# Patient Record
Sex: Female | Born: 1955 | Race: White | Hispanic: No | Marital: Married | Smoking: Never smoker
Health system: Southern US, Community
[De-identification: ages and names within clinical notes are randomized; demographics above are authoritative.]

## PROBLEM LIST (undated history)

## (undated) DIAGNOSIS — I7300 Raynaud's syndrome without gangrene: Secondary | ICD-10-CM

## (undated) DIAGNOSIS — M0230 Reiter's disease, unspecified site: Secondary | ICD-10-CM

## (undated) DIAGNOSIS — M069 Rheumatoid arthritis, unspecified: Secondary | ICD-10-CM

## (undated) DIAGNOSIS — G5139 Clonic hemifacial spasm, unspecified: Secondary | ICD-10-CM

## (undated) DIAGNOSIS — I10 Essential (primary) hypertension: Secondary | ICD-10-CM

## (undated) DIAGNOSIS — G479 Sleep disorder, unspecified: Secondary | ICD-10-CM

## (undated) DIAGNOSIS — M1990 Unspecified osteoarthritis, unspecified site: Secondary | ICD-10-CM

## (undated) DIAGNOSIS — M797 Fibromyalgia: Secondary | ICD-10-CM

## (undated) DIAGNOSIS — M8580 Other specified disorders of bone density and structure, unspecified site: Secondary | ICD-10-CM

## (undated) DIAGNOSIS — N898 Other specified noninflammatory disorders of vagina: Secondary | ICD-10-CM

## (undated) DIAGNOSIS — H3530 Unspecified macular degeneration: Secondary | ICD-10-CM

## (undated) HISTORY — PX: OTHER SURGICAL HISTORY: SHX169

## (undated) HISTORY — DX: Rheumatoid arthritis, unspecified: M06.9

## (undated) HISTORY — DX: Other specified noninflammatory disorders of vagina: N89.8

## (undated) HISTORY — DX: Clonic hemifacial spasm, unspecified: G51.39

## (undated) HISTORY — DX: Essential (primary) hypertension: I10

## (undated) HISTORY — DX: Sleep disorder, unspecified: G47.9

## (undated) HISTORY — DX: Raynaud's syndrome without gangrene: I73.00

## (undated) HISTORY — DX: Unspecified macular degeneration: H35.30

## (undated) HISTORY — DX: Fibromyalgia: M79.7

## (undated) HISTORY — DX: Reiter's disease, unspecified site: M02.30

## (undated) HISTORY — DX: Unspecified osteoarthritis, unspecified site: M19.90

## (undated) HISTORY — PX: BREAST SURGERY: SHX581

## (undated) HISTORY — DX: Other specified disorders of bone density and structure, unspecified site: M85.80

---

## 2003-06-18 ENCOUNTER — Encounter: Admission: RE | Admit: 2003-06-18 | Discharge: 2003-06-18 | Payer: Self-pay | Admitting: Rheumatology

## 2003-06-18 ENCOUNTER — Encounter: Payer: Self-pay | Admitting: Rheumatology

## 2003-10-09 ENCOUNTER — Ambulatory Visit (HOSPITAL_COMMUNITY): Admission: RE | Admit: 2003-10-09 | Discharge: 2003-10-09 | Payer: Self-pay | Admitting: Family Medicine

## 2004-08-14 ENCOUNTER — Other Ambulatory Visit: Admission: RE | Admit: 2004-08-14 | Discharge: 2004-08-14 | Payer: Self-pay | Admitting: Obstetrics and Gynecology

## 2004-09-30 ENCOUNTER — Encounter: Admission: RE | Admit: 2004-09-30 | Discharge: 2004-09-30 | Payer: Self-pay | Admitting: Gastroenterology

## 2004-10-06 ENCOUNTER — Encounter (INDEPENDENT_AMBULATORY_CARE_PROVIDER_SITE_OTHER): Payer: Self-pay | Admitting: *Deleted

## 2004-10-06 ENCOUNTER — Ambulatory Visit (HOSPITAL_COMMUNITY): Admission: RE | Admit: 2004-10-06 | Discharge: 2004-10-06 | Payer: Self-pay | Admitting: Gastroenterology

## 2004-11-02 ENCOUNTER — Ambulatory Visit (HOSPITAL_COMMUNITY): Admission: RE | Admit: 2004-11-02 | Discharge: 2004-11-02 | Payer: Self-pay | Admitting: Family Medicine

## 2005-08-20 ENCOUNTER — Other Ambulatory Visit: Admission: RE | Admit: 2005-08-20 | Discharge: 2005-08-20 | Payer: Self-pay | Admitting: Obstetrics and Gynecology

## 2005-11-12 ENCOUNTER — Ambulatory Visit (HOSPITAL_COMMUNITY): Admission: RE | Admit: 2005-11-12 | Discharge: 2005-11-12 | Payer: Self-pay | Admitting: Obstetrics and Gynecology

## 2006-11-14 ENCOUNTER — Ambulatory Visit (HOSPITAL_COMMUNITY): Admission: RE | Admit: 2006-11-14 | Discharge: 2006-11-14 | Payer: Self-pay | Admitting: Obstetrics and Gynecology

## 2006-11-22 ENCOUNTER — Encounter: Admission: RE | Admit: 2006-11-22 | Discharge: 2006-11-22 | Payer: Self-pay | Admitting: Obstetrics and Gynecology

## 2006-11-22 ENCOUNTER — Other Ambulatory Visit: Admission: RE | Admit: 2006-11-22 | Discharge: 2006-11-22 | Payer: Self-pay | Admitting: Obstetrics and Gynecology

## 2007-11-04 IMAGING — MG MM DIAGNOSTIC UNILATERAL L
3 series · 3 of 3 positions shown · non-contrast
Comparison: none

DG DIAGNOSTIC UNILATERAL L
CC and MLO view(s) were taken of the left breast.

LEFT BREAST ULTRASOUND
DIGITAL UNILATERAL LEFT DIAGNOSTIC MAMMOGRAM AND LEFT BREAST ULTRASOUND:
CLINICAL DATA: The patient returns for evaluation of a possible mass in the left breast noted on 
recent screening study from [HOSPITAL] dated 11-14-06.

[L CC]
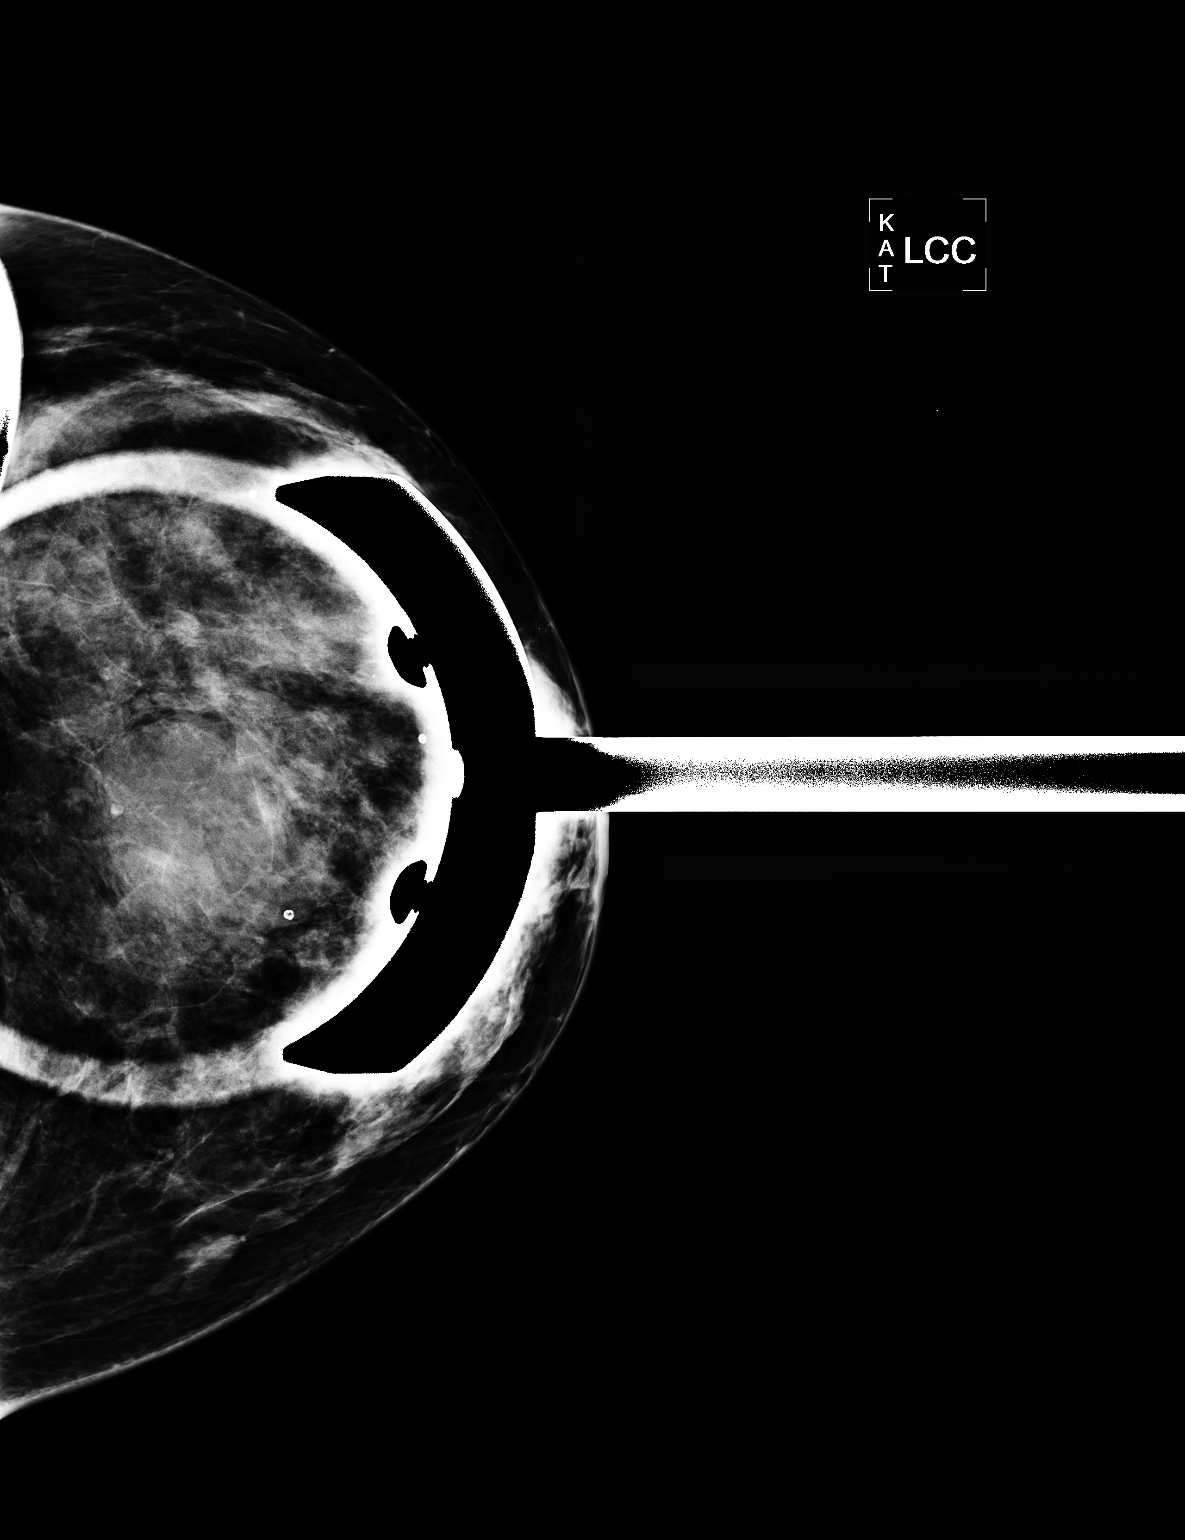

[L MLO (1 of 2)]
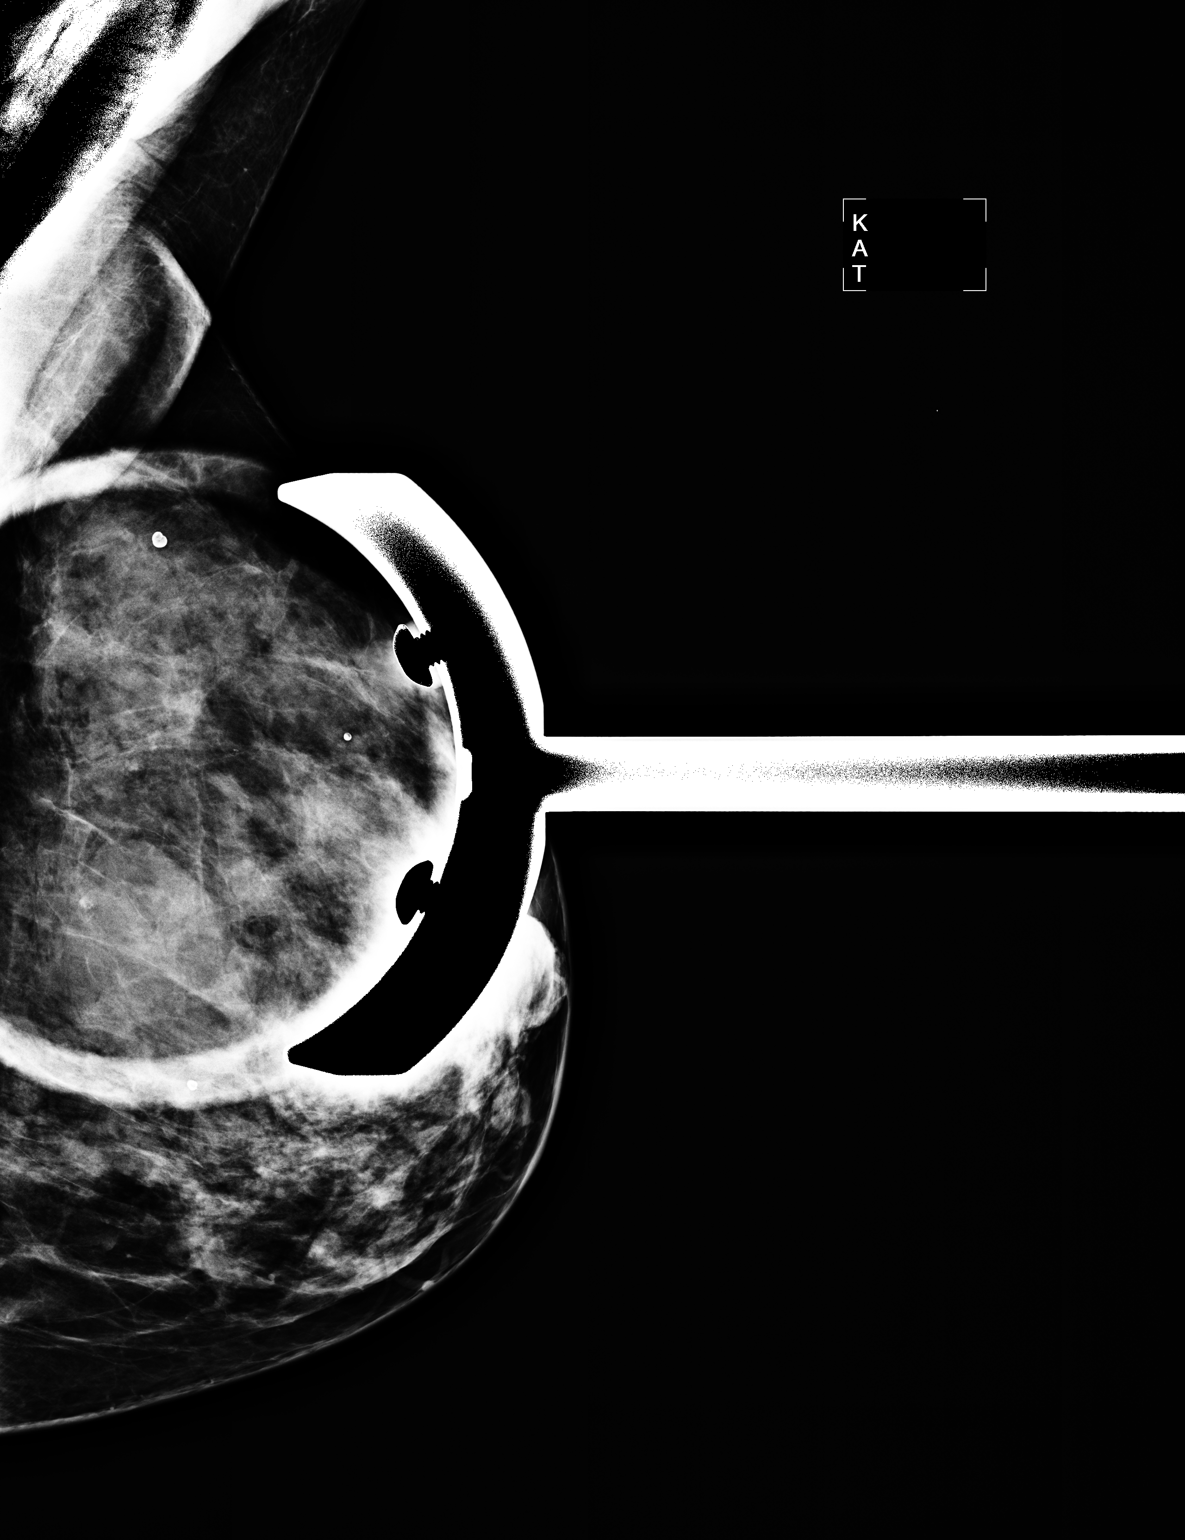

[L MLO (2 of 2)]
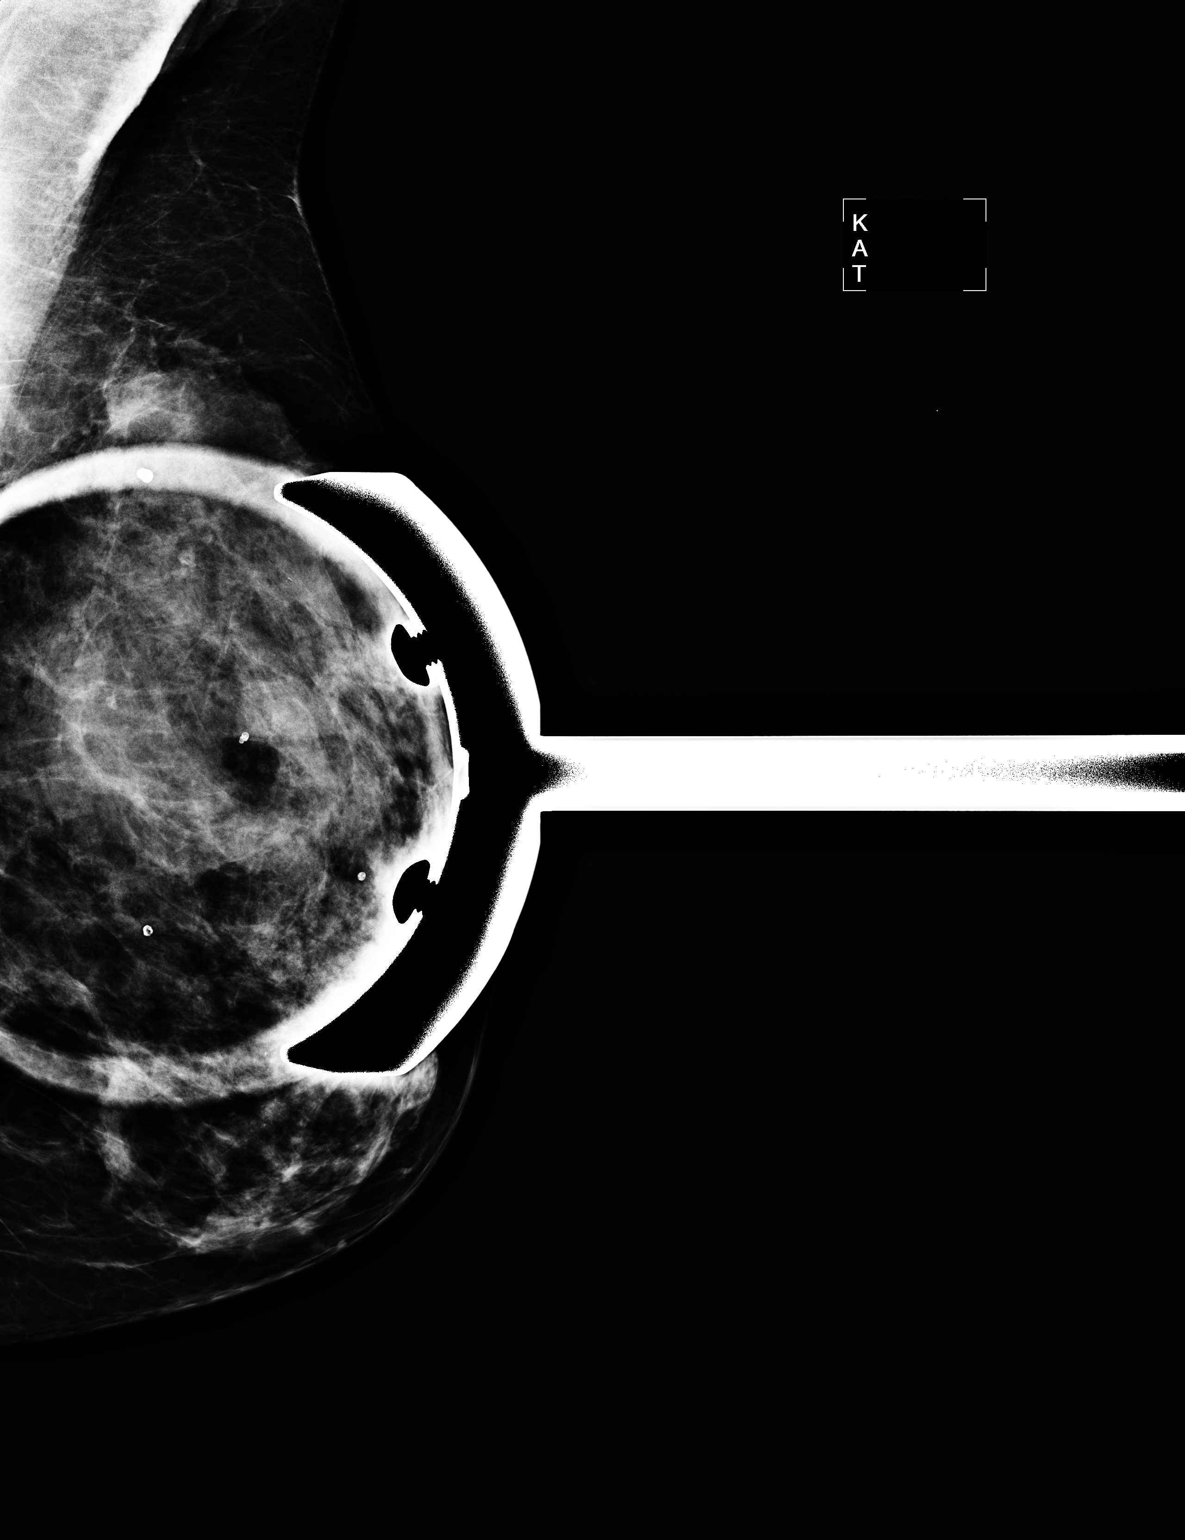

[3 of 3 positions shown; findings below may reference images not displayed]

Additional views demonstrate a partially obscured, partially circumscribed round mass in the 
central portion of the left breast.

On physical examination, no mass is palpated.  Sonography demonstrates a simple cyst at 2 o'clock 3
cm from the nipple corresponding to the mammographic nodule.  This measures 4 x 3.5 x 0.9 cm.  No 
solid mass, distortion or shadowing to suggest malignancy is identified.
IMPRESSION: The mammographic finding corresponds to a simple cyst a sonography.  Yearly screening mammography 
is suggested.

ASSESSMENT: Benign - BI-RADS 2

Screening mammogram of both breasts in 1 year.
,

## 2007-12-01 ENCOUNTER — Ambulatory Visit (HOSPITAL_COMMUNITY): Admission: RE | Admit: 2007-12-01 | Discharge: 2007-12-01 | Payer: Self-pay | Admitting: Obstetrics and Gynecology

## 2007-12-21 ENCOUNTER — Other Ambulatory Visit: Admission: RE | Admit: 2007-12-21 | Discharge: 2007-12-21 | Payer: Self-pay | Admitting: Obstetrics and Gynecology

## 2008-06-13 ENCOUNTER — Ambulatory Visit: Payer: Self-pay | Admitting: Gastroenterology

## 2008-07-08 ENCOUNTER — Ambulatory Visit (HOSPITAL_COMMUNITY): Admission: RE | Admit: 2008-07-08 | Discharge: 2008-07-08 | Payer: Self-pay | Admitting: Gastroenterology

## 2008-07-08 ENCOUNTER — Encounter: Payer: Self-pay | Admitting: Gastroenterology

## 2008-07-08 ENCOUNTER — Ambulatory Visit: Payer: Self-pay | Admitting: Gastroenterology

## 2008-09-03 ENCOUNTER — Ambulatory Visit: Payer: Self-pay | Admitting: Gastroenterology

## 2009-02-07 ENCOUNTER — Other Ambulatory Visit: Admission: RE | Admit: 2009-02-07 | Discharge: 2009-02-07 | Payer: Self-pay | Admitting: Obstetrics and Gynecology

## 2009-02-07 ENCOUNTER — Ambulatory Visit: Payer: Self-pay | Admitting: Obstetrics and Gynecology

## 2009-02-07 ENCOUNTER — Encounter: Payer: Self-pay | Admitting: Obstetrics and Gynecology

## 2009-02-07 ENCOUNTER — Encounter: Admission: RE | Admit: 2009-02-07 | Discharge: 2009-02-07 | Payer: Self-pay | Admitting: Obstetrics and Gynecology

## 2009-09-10 ENCOUNTER — Ambulatory Visit (HOSPITAL_COMMUNITY): Admission: RE | Admit: 2009-09-10 | Discharge: 2009-09-10 | Payer: Self-pay | Admitting: Family Medicine

## 2010-06-05 ENCOUNTER — Other Ambulatory Visit: Admission: RE | Admit: 2010-06-05 | Discharge: 2010-06-05 | Payer: Self-pay | Admitting: Obstetrics and Gynecology

## 2010-06-05 ENCOUNTER — Ambulatory Visit: Payer: Self-pay | Admitting: Obstetrics and Gynecology

## 2010-06-22 ENCOUNTER — Ambulatory Visit (HOSPITAL_COMMUNITY): Admission: RE | Admit: 2010-06-22 | Discharge: 2010-06-22 | Payer: Self-pay | Admitting: Obstetrics and Gynecology

## 2010-09-08 ENCOUNTER — Ambulatory Visit: Payer: Self-pay | Admitting: Obstetrics and Gynecology

## 2011-01-12 LAB — BASIC METABOLIC PANEL
BUN: 14 mg/dL (ref 6–23)
CO2: 31 mEq/L (ref 19–32)
Calcium: 9.4 mg/dL (ref 8.4–10.5)
Chloride: 105 mEq/L (ref 96–112)
Creatinine, Ser: 0.91 mg/dL (ref 0.4–1.2)
GFR calc Af Amer: >60 mL/min (ref >60)
GFR calc non Af Amer: >60 mL/min (ref >60)
Glucose, Bld: 108 mg/dL — ABNORMAL HIGH (ref 70–99)
Potassium: 4 mEq/L (ref 3.5–5.1)
Sodium: 141 mEq/L (ref 135–145)

## 2011-02-23 NOTE — Assessment & Plan Note (Signed)
NAMEMarland Kitchen  ETOSHA, WETHERELL               CHART#:  [id number]   DATE:  09/03/2008                       DOB:  [birth year]   REFERRING PHYSICIANS:  1. Lacy Duverney, MD, fax number [phone].  2. Pollyann Savoy, MD, fax number [phone].   PROBLEM LIST:  1. Chronic diarrhea.  2. 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  3. Upper endoscopy in September 2009, which showed no benign duodenal      mucosa.  4. Fibromyalgia.  5. Reiter syndrome.  6. Osteoarthritis.  7. Fibroids.  8. Hemorrhoids.   SUBJECTIVE:  Ms. Swanton is a 55 year old female who was initially seen  in September 2009, complaining of diarrhea.  On her initial  presentation, she was having 10 bowel movements over a 24-hour period of  time.  When she saw me, her symptoms were improving, but she was having  rectal bleeding.  She was having 4-6 bowel movements a day.  She had  negative stool studies, culture, O&P, and the colonoscopy did not reveal  an etiology for her diarrhea.  She was recommended continued  observation.  She presents not having any need for Imodium or any other  antidiarrheal medication.  She is regular-like I was.  She still uses  Celebrex and methotrexate.   MEDICATIONS:  1. Methotrexate.  2. Atenolol.  3. Lorazepam.  4. Celebrex b.i.d.  5. Nexium as needed.  6. Calcium.  7. Neomycin/polymyxin.  8. Ocular ophthalmic.   OBJECTIVE:  VITAL SIGNS:  Weight 132 pounds (up 4 pounds since September  2009), height 5 feet 4 inches, temperature 93, blood pressure 100/80,  pulse 72.  GENERAL:  She is in no apparent distress.  Alert and oriented x4.HEENT:  Atraumatic or normocephalic.  Pupils equal and reactive to light.  Mouth, no oral lesions.  Posterior pharynx without erythema or  exudate.LUNGS:  Clear to auscultation bilaterally.CARDIOVASCULAR:  Regular rhythm.  No murmur.ABDOMEN:  Bowel sounds are present, soft,  nontender, and nondistended.   ASSESSMENT:  Ms. Peruski is a  55 year old female who had diarrhea.  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 Thank you for allowing me to see Ms.  Vansickle in consultation.  My recommendations follow.   RECOMMENDATIONS:  She may follow with me as needed.  If her diarrhea  returns then we will need to reassess for another etiology, could  consider a hydrogen breath test.       Kassie Mends, M.D.  Electronically Signed     SM/MEDQ  D:  09/03/2008  T:  09/03/2008  Job:  [id number]   cc:   Pollyann Savoy, M.D.  Lacy Duverney, M.D.

## 2011-02-23 NOTE — Op Note (Signed)
NAME:  Monica [name], Monica [name]              ACCOUNT NO.:  [id number]   MEDICAL RECORD NO.:  [id number]          PATIENT TYPE:  AMB   LOCATION:  DAY                           FACILITY:  [facility]   PHYSICIAN:  Kassie Mends, M.D.      DATE OF BIRTH:  07-15-1956   DATE OF PROCEDURE:  DATE OF DISCHARGE:                               OPERATIVE REPORT   REFERRING PHYSICIAN:  Lacy Duverney, MD   PROCEDURES:  1. Ileocolonoscopy with cold forceps biopsy of the distal terminal      ileum, colon, and rectum.  2. Esophagogastroduodenoscopy with cold forceps biopsy of the      duodenum.   INDICATION FOR EXAM:  Monica [name] is a 55 year old female with a history  of IBS diarrhea predominant.  She has significant psychosocial stressors  taking care of her mother who has Alzheimer's.  She has been having  diarrhea since July 2009 associated with abdominal cramping.  She is  immunocompromised due to be no methotrexate for Reiter's syndrome.  She  did have stool samples in August 2009 which showed no evidence of  Salmonella Shigella, Campylobacter or Yersinia.  She had two O&P studies  which showed no evidence of parasitic infection.   FINDINGS:  1. Patchy erythema with prominent lymphoid follicles in the distal      terminal ileum.  Biopsies obtained via cold forceps.  2. Normal colon without evidence of polyps, masses, inflammatory      changes, diverticula or AVMs.  Random biopsies obtained via cold      forceps to evaluate for microscopic colitis.  3. Mild erythema with a loss of vascular markings seen in the rectum      near the dentate line.  Biopsies obtained via cold forceps.      Otherwise, no internal hemorrhoids.  4. Normal esophagus without evidence of Barrett's mass, erosion,      ulceration or stricture.  5. Small hiatal hernia.  Otherwise, normal stomach.  6. Normal duodenal bulb.  7. 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   Biopsies obtained via cold      forceps to evaluate for celiac sprue.   DIAGNOSIS:  No obvious source for Monica [name]'s diarrhea identified.   RECOMMENDATIONS:  1. Screening colonoscopy in 10 years.  2. We will call Monica [name] with the results of her biopsies.  3. No aspirin, NSAIDs or anticoagulation for 7 days.  4. She should follow a high-fiber diet.  She is given a handout on      high-fiber diet.  5. She may continue use the Levsin as needed.  6. Follow up appointment in 6 weeks with Dr. Cira Servant regarding her      diarrhea.   MEDICATIONS:  1. Demerol 100 IV.  2. Versed 6 mg IV.   PROCEDURE TECHNIQUE:  Physical exam was performed.  Informed consent was  obtained from the patient after explaining the benefits, risks and  alternatives to the procedure.  The patient was connected to the monitor  and placed  in left lateral position.  Continuous oxygen was provided by  nasal cannula and IV medicine administered through an indwelling  cannula.  After administration of sedation and rectal exam, the  patient's rectum was intubated and the scope was advanced under direct  visualization to the distal terminal ileum.  The scope was removed  slowly by carefully examining the color, texture, anatomy and integrity  of the mucosa on the way out.   After the colonoscopy, the patient's esophagus was intubated with a  diagnostic gastroscope and the scope was advanced under direct  visualization to the second portion of the duodenum.  The scope was  removed slowly by carefully examining the color, texture, anatomy and  integrity of the mucosa on the way out.  The patient was recovered in  endoscopy and discharged home in satisfactory condition.   PATH:  Terminal ileitis ? 2o to Celebrex. Colon/rectum/duodenum: nl      Kassie Mends, M.D.  Electronically Signed     SM/MEDQ  D:  07/08/2008  T:  07/08/2008  Job:  [id number]   cc:   Lacy Duverney, M.D.  Fax: [phone]   Pollyann Savoy,  M.D.  Fax: [phone]

## 2011-02-23 NOTE — Consult Note (Signed)
NAMEMarland Kitchen  Monica, [name] NO.:  [id number]   MEDICAL RECORD NO.:  [id number]          PATIENT TYPE:  PAMB   LOCATION:  DAY                           FACILITY:  [facility]   PHYSICIAN:  Kassie Mends, M.D.      DATE OF BIRTH:  August 24, 1956   DATE OF CONSULTATION:  06/13/2008  DATE OF DISCHARGE:                                 CONSULTATION   REFERRING PHYSICIAN:  Mila Homer. Sudie Bailey, MD   PRIMARY RHEUMATOLOGIST:  Pollyann Savoy, MD   REASON OF CONSULTATION:  Diarrhea.   HISTORY OF PRESENT ILLNESS:  Monica [name] is a 55 year old female who  has had left-sided abdominal pain off and on for years associated with  diarrhea.  She was told years ago when she was having children that she  had IBS when she was having bouts of diarrhea.  She has been taking care  of her mother who has Alzheimer's for the last 2 years, but denies  feeling stressed about that.  She is now receiving assistance and her  sister also helps with the care of their mother.  She states that she  went to the beach and on May 01, 2008, ate [place] jerk pork with lots  of barbecue sauce.  She woke up in the middle of the night with  abdominal cramping and diarrhea.  She continued to have diarrhea for 48  hours.  She had at least 10 bowel movements in a 24-hour period of time.  Initially with large quantity of watery stool and then small quantity.  She has continued to have intermittent episodes of watery stool off and  on since July 2009.  She usually has anywhere from 4-6 bowel movements a  day.  Occasional she has days of time when she has 0 bowel movement.  She was tried on Questran twice a day, which seemed to make her not go  to the bathroom at all.  She then decreased to once a day and the watery  stool began.  On May 18, 2008, she saw bright red blood in the stool.  She did get stool samples in August 2009 that showed no evidence of  Salmonella, Shigella, Campylobacter, and Yersinia.  She had 2 O&P  studies, which showed no evidence of parasitic infection.  She currently  is having normal bowel movements for the last 2-3 days.  Her last  menstrual period was May 20, 2008.  She never had any fever.  Does  not have well water and has not been using any antibiotics.  Her husband  had a vasectomy.  When she did have the bowel movement, it was  associated with bilateral lower quadrant cramping.  She denies any  rashes or sores in mouth.  She has not had any new medications and  introduced any new foods.  She was seen by [facility] in [place]  when she was having problems with indigestion, problems with stress and  heartburn, and had an EGD, which she said showed some mild irritation in  the esophagus and was started on Nexium, which has helped.  She has  never seen any black stools that looked like tar.   PAST MEDICAL HISTORY:  1. Reiter's syndrome.  2. Osteoarthritis.  3. Fibromyalgia.  4. Fibroids.  5. Hemorrhoids.   PAST SURGICAL HISTORY:  Two lumps removed from the left breast.   ALLERGIES:  No known drug allergies.   MEDICATIONS:  1. Methotrexate since 2004, 2.5 mg weekly.  2. Atenolol 50 mg half daily.  3. Lorazepam 0.5 mg as needed.  4. Celebrex 2000 mg daily since 2004.  5. Folic acid.  6. Nexium 40 mg as needed.  7. Calcium with vitamin D daily.  8. Imodium as needed.   FAMILY HISTORY:  She has no family history of colon cancer, colon  polyps, celiac disease, breast, uterine, or ovarian cancer.  She did  have an uncle who had an unknown type of cancer.   SOCIAL HISTORY:  She is married over 30 years.  She has 2 children.  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  She denies any tobacco or alcohol use.   REVIEW OF SYSTEMS:  As per the HPI, otherwise all systems are negative.   PHYSICAL EXAMINATION:  VITAL SIGNS:  Weight 128-1/2 pounds, height 5  feet and 4 inches, BMI 22 (healthy), temperature 98, blood pressure  110/68,  and pulse 72.  GENERAL:  She is in no apparent distress.  Alert and oriented x4.HEENT:  Atraumatic and normocephalic.  Pupils equal and reactive to light.  Mouth, no oral lesions.  Posterior pharynx without erythema or  exudate.NECK:  Full range of motion.  No lymphadenopathy.LUNGS:  Clear  to auscultation bilaterally.CARDIOVASCULAR:  Regular rhythm.  No murmur.  Normal S1 and S2.ABDOMEN:  Bowel sounds are present.  Soft, nontender,  and nondistended.  No rebound or guarding.EXTREMITIES:  No cyanosis or  edema.NEURO:  She has no focal neurologic deficits.   ASSESSMENT:  Monica [name] is a 55 year old immunocompromised female who  has had intermittent diarrhea since July 2009.  Her symptoms seem to be  improving.  The differential diagnosis for her symptoms include  protracted viral gastroenteritis due to the fact that she is  immunocompromised, celiac sprue, microscopic colitis, inflammatory bowel  disease, and a low-likelihood of cytomegalovirus colitis. Thank you for  allowing me to see Monica [name] in consultation.  My recommendations  follow.   RECOMMENDATIONS:  1. She is given a prescription for Levsin 0.125 mg tablets.  She may      use 1-2 sublingual with onset of the abdominal cramping and may      repeat that every 4 hours with no more than 8 pills per day.  She      was informed that Levsin may cause drowsiness, dry mouth, dry eyes,      and urinary retention.  2. Will obtain stool for fecal lactoferrin, Giardia antigen if the      diarrhea returns.  3. She will be scheduled for colonoscopy and/or an EGD with a movie      prep.  If her diarrhea has resolved at the      time of the colonoscopy then the EGD will be cancelled.  4. Will obtain labs from Dr. Pollyann Savoy.  The patient's husband      has had a vasectomy and she denies any possibility that she could      be pregnant and so urine HCG will not be obtained.      Kassie Mends, M.D.  Electronically Signed      SM/MEDQ  D:  06/13/2008  T:  06/14/2008  Job:  [id number]   cc:   Pollyann Savoy, M.D.  Fax: [phone]   Mila Homer. Sudie Bailey, M.D.  Fax: [phone]

## 2011-02-26 NOTE — Op Note (Signed)
NAME:  Monica [name], Monica [name]              ACCOUNT NO.:  [id number]   MEDICAL RECORD NO.:  [id number]          PATIENT TYPE:  AMB   LOCATION:  ENDO                         FACILITY:  [facility]   PHYSICIAN:  Jordan Hawks. Elnoria Howard, MD    DATE OF BIRTH:  1956-02-04   DATE OF PROCEDURE:  10/06/2004  DATE OF DISCHARGE:                                 OPERATIVE REPORT   PROCEDURES:  Esophagogastroduodenoscopy.   INDICATIONS:  Dysphagia.   REFERRING PHYSICIAN:  Pollyann Savoy, M.D.   CONSENT:  Informed consent was obtained from the patient describing the  risks of bleeding, infection, perforation, medication reactions and the risk  of death, all of which are not exclusive of any other complications that may  occur.   PHYSICAL EXAMINATION:  CARDIAC:  Regular rate and rhythm.  LUNGS:  Clear to auscultation bilaterally.  ABDOMEN:  Soft, flat, nontender and nondistended.   MEDICATIONS:  1.  Versed 5 mg IV.  2.  Demerol 50 mg IV.   DESCRIPTION OF PROCEDURE:  After adequate sedation was achieved, the  endoscope was advanced from the oral cavity to the duodenum under direct  visualization.  There was no abnormality noted within the duodenum.  Upon  slow withdraw of the endoscope, there was no evidence of any masses,  ulcerations or erosions within the body or the antrum.  Retroflexion within  the fundus did reveal mild erythema in the fundic region.  This area was  biopsied using cold forceps.  The endoscope was straightened and withdrawn  into the esophagus.  The patient was noted to have a normal Z line at 40 cm.  There was no evidence of any strictures or esophagitis within this area.  The endoscope was then withdrawn from the patient and the procedure was  terminated.   PLAN:  1.  Follow up on biopsies.  2.  Return to clinic in two weeks.       PDH/MEDQ  D:  10/06/2004  T:  10/06/2004  Job:  [id number]   cc:   Pollyann Savoy, M.D.  [address]  [place], [place] [address]  Fax: [phone]

## 2011-05-27 ENCOUNTER — Other Ambulatory Visit: Payer: Self-pay | Admitting: Obstetrics and Gynecology

## 2011-05-27 DIAGNOSIS — Z139 Encounter for screening, unspecified: Secondary | ICD-10-CM

## 2011-06-22 DIAGNOSIS — M0230 Reiter's disease, unspecified site: Secondary | ICD-10-CM | POA: Insufficient documentation

## 2011-06-22 DIAGNOSIS — M1990 Unspecified osteoarthritis, unspecified site: Secondary | ICD-10-CM | POA: Insufficient documentation

## 2011-06-22 DIAGNOSIS — M8580 Other specified disorders of bone density and structure, unspecified site: Secondary | ICD-10-CM | POA: Insufficient documentation

## 2011-06-22 DIAGNOSIS — M797 Fibromyalgia: Secondary | ICD-10-CM | POA: Insufficient documentation

## 2011-06-22 DIAGNOSIS — I7300 Raynaud's syndrome without gangrene: Secondary | ICD-10-CM | POA: Insufficient documentation

## 2011-06-25 ENCOUNTER — Ambulatory Visit (HOSPITAL_COMMUNITY)
Admission: RE | Admit: 2011-06-25 | Discharge: 2011-06-25 | Disposition: A | Discharge status: Home / Self Care | Payer: 59 | Source: Ambulatory Visit | Attending: Obstetrics and Gynecology | Admitting: Obstetrics and Gynecology

## 2011-06-25 DIAGNOSIS — Z139 Encounter for screening, unspecified: Secondary | ICD-10-CM

## 2011-06-25 DIAGNOSIS — Z1231 Encounter for screening mammogram for malignant neoplasm of breast: Secondary | ICD-10-CM | POA: Insufficient documentation

## 2011-07-02 ENCOUNTER — Other Ambulatory Visit: Payer: Self-pay | Admitting: Obstetrics and Gynecology

## 2011-07-02 DIAGNOSIS — R928 Other abnormal and inconclusive findings on diagnostic imaging of breast: Secondary | ICD-10-CM

## 2011-07-05 ENCOUNTER — Other Ambulatory Visit (HOSPITAL_COMMUNITY)
Admission: RE | Admit: 2011-07-05 | Discharge: 2011-07-05 | Disposition: A | Discharge status: Home / Self Care | Payer: 59 | Source: Ambulatory Visit | Attending: Obstetrics and Gynecology | Admitting: Obstetrics and Gynecology

## 2011-07-05 ENCOUNTER — Ambulatory Visit (INDEPENDENT_AMBULATORY_CARE_PROVIDER_SITE_OTHER): Payer: 59 | Admitting: Obstetrics and Gynecology

## 2011-07-05 ENCOUNTER — Encounter: Payer: Self-pay | Admitting: Obstetrics and Gynecology

## 2011-07-05 VITALS — BP 124/76 | Ht 64.5 in | Wt 134.0 lb

## 2011-07-05 DIAGNOSIS — N952 Postmenopausal atrophic vaginitis: Secondary | ICD-10-CM

## 2011-07-05 DIAGNOSIS — Z01419 Encounter for gynecological examination (general) (routine) without abnormal findings: Secondary | ICD-10-CM

## 2011-07-05 DIAGNOSIS — Z13220 Encounter for screening for lipoid disorders: Secondary | ICD-10-CM

## 2011-07-05 DIAGNOSIS — N951 Menopausal and female climacteric states: Secondary | ICD-10-CM

## 2011-07-05 DIAGNOSIS — R82998 Other abnormal findings in urine: Secondary | ICD-10-CM

## 2011-07-05 DIAGNOSIS — E559 Vitamin D deficiency, unspecified: Secondary | ICD-10-CM

## 2011-07-05 DIAGNOSIS — Z780 Asymptomatic menopausal state: Secondary | ICD-10-CM

## 2011-07-05 DIAGNOSIS — N898 Other specified noninflammatory disorders of vagina: Secondary | ICD-10-CM | POA: Insufficient documentation

## 2011-07-05 DIAGNOSIS — E7800 Pure hypercholesterolemia, unspecified: Secondary | ICD-10-CM

## 2011-07-05 NOTE — Progress Notes (Signed)
Patient came to see me today for an annual GYN exam. She is up-to-date on bone densities. She a low vitamin D and is currently taking 1000 vitamin D daily. She's had no fractures. She is having some menopausal symptoms. They consist of difficulty sleeping with a feeling of warmth but not severe hot flashes. She is also having vaginal dryness with dyspareunia. She said she went for her mammograms several weeks ago. The report in her chart from [facility] showed that she was recalled for views of her left breast. She said this was not the case.  Physical examination: HEENT within normal limits. Neck: Thyroid not large. No masses. Supraclavicular nodes: not enlarged. Breasts: Examined in both sitting midline position. No skin changes and no masses. Abdomen: Soft no guarding rebound or masses or hernia. Pelvic: External: Within normal limits. BUS: Within normal limits. Vaginal:within normal limits. Poor estrogen effect. No evidence of cystocele rectocele or enterocele. Cervix: clean. Uterus: Normal size and shape. Adnexa: No masses. Rectovaginal exam: Confirmatory and negative. Extremities: Within normal limits.  Assessment: Menopausal symptoms  Plan: We discussed systemic HRT. We discussed risks including increased risk of breast cancer and DVT. We discussed estrogen patch to reduce the DVT risk. For the moment she's elected just to treat the vaginal symptoms. We gave her samples of Vagifem 10 mcgs to use 3 times a week and she will call for rx for this and for systemic HRT when necessary. We will check on the discrepancy in her mammogram. She will call tomorrow to see what happened. Please note she never got a post card from them.

## 2011-07-06 ENCOUNTER — Other Ambulatory Visit: Payer: Self-pay | Admitting: Obstetrics and Gynecology

## 2011-07-06 DIAGNOSIS — R928 Other abnormal and inconclusive findings on diagnostic imaging of breast: Secondary | ICD-10-CM

## 2011-07-07 MED ORDER — NITROFURANTOIN MONOHYD MACRO 100 MG PO CAPS: 100.0000 mg | ORAL_CAPSULE | Freq: Two times a day (BID) | ORAL | Status: AC

## 2011-07-07 NOTE — Progress Notes (Signed)
Addended byValeda Malm L on: 07/07/2011 10:13 AM   Modules accepted: Orders

## 2011-07-21 ENCOUNTER — Ambulatory Visit (HOSPITAL_COMMUNITY): Payer: 59

## 2011-08-04 ENCOUNTER — Ambulatory Visit (HOSPITAL_COMMUNITY)
Admission: RE | Admit: 2011-08-04 | Discharge: 2011-08-04 | Disposition: A | Discharge status: Home / Self Care | Payer: 59 | Source: Ambulatory Visit | Attending: Obstetrics and Gynecology | Admitting: Obstetrics and Gynecology

## 2011-08-04 DIAGNOSIS — N63 Unspecified lump in unspecified breast: Secondary | ICD-10-CM | POA: Insufficient documentation

## 2011-08-04 DIAGNOSIS — N6009 Solitary cyst of unspecified breast: Secondary | ICD-10-CM | POA: Insufficient documentation

## 2011-08-04 DIAGNOSIS — R928 Other abnormal and inconclusive findings on diagnostic imaging of breast: Secondary | ICD-10-CM

## 2012-01-19 ENCOUNTER — Other Ambulatory Visit (HOSPITAL_COMMUNITY): Payer: Self-pay | Admitting: Family Medicine

## 2012-01-19 ENCOUNTER — Ambulatory Visit (HOSPITAL_COMMUNITY)
Admission: RE | Admit: 2012-01-19 | Discharge: 2012-01-19 | Disposition: A | Discharge status: Home / Self Care | Payer: 59 | Source: Ambulatory Visit | Attending: Family Medicine | Admitting: Family Medicine

## 2012-01-19 DIAGNOSIS — R0602 Shortness of breath: Secondary | ICD-10-CM

## 2012-07-11 ENCOUNTER — Telehealth: Payer: Self-pay | Admitting: *Deleted

## 2012-07-11 NOTE — Telephone Encounter (Signed)
Message copied by Aura Camps on Tue Jul 11, 2012 10:17 AM ------      Message from: Trellis Paganini      Created: Tue Jul 11, 2012  8:58 AM       Time for rgce. Make her appointment. Have her come fasted so we can check lipid profile.

## 2012-07-11 NOTE — Telephone Encounter (Signed)
Left the below note on pt voicemail. 

## 2012-07-31 ENCOUNTER — Other Ambulatory Visit: Payer: Self-pay | Admitting: Obstetrics and Gynecology

## 2012-07-31 DIAGNOSIS — Z139 Encounter for screening, unspecified: Secondary | ICD-10-CM

## 2012-08-08 ENCOUNTER — Ambulatory Visit (HOSPITAL_COMMUNITY): Payer: 59

## 2012-08-08 ENCOUNTER — Ambulatory Visit (HOSPITAL_COMMUNITY)
Admission: RE | Admit: 2012-08-08 | Discharge: 2012-08-08 | Disposition: A | Discharge status: Home / Self Care | Payer: 59 | Source: Ambulatory Visit | Attending: Obstetrics and Gynecology | Admitting: Obstetrics and Gynecology

## 2012-08-08 DIAGNOSIS — Z1231 Encounter for screening mammogram for malignant neoplasm of breast: Secondary | ICD-10-CM | POA: Insufficient documentation

## 2012-08-08 DIAGNOSIS — Z139 Encounter for screening, unspecified: Secondary | ICD-10-CM

## 2012-08-24 ENCOUNTER — Ambulatory Visit (INDEPENDENT_AMBULATORY_CARE_PROVIDER_SITE_OTHER): Payer: 59 | Admitting: Obstetrics and Gynecology

## 2012-08-24 ENCOUNTER — Encounter: Payer: Self-pay | Admitting: Obstetrics and Gynecology

## 2012-08-24 VITALS — BP 120/80 | Ht 64.0 in | Wt 134.0 lb

## 2012-08-24 DIAGNOSIS — Z23 Encounter for immunization: Secondary | ICD-10-CM

## 2012-08-24 DIAGNOSIS — Z01419 Encounter for gynecological examination (general) (routine) without abnormal findings: Secondary | ICD-10-CM

## 2012-08-24 NOTE — Progress Notes (Signed)
Patient came to see me today for her annual GYN exam. She is doing well without hormone replacement therapy. She is just periodically dry with intercourse and does not need medication. She is very busy both with work and her mother who has Alzheimer's disease. She does her lab through her PCP. She has always had normal Pap smears. Her last Pap smear was 2012. She is having no vaginal bleeding. She is having no pelvic pain. She is up-to-date on mammograms. She does have osteopenia without an elevated fracture risk. She takes calcium and vitamin D. She has not had a fracture. She is due for followup bone density.  Physical examination:Monica [name] present. HEENT within normal limits. Neck: Thyroid not large. No masses. Supraclavicular nodes: not enlarged. Breasts: Examined in both sitting and lying  position. No skin changes and no masses. Abdomen: Soft no guarding rebound or masses or hernia. Pelvic: External: Within normal limits. BUS: Within normal limits. Vaginal:within normal limits. Good estrogen effect. No evidence of cystocele rectocele or enterocele. Cervix: clean. Uterus: Normal size and shape. Adnexa: No masses. Rectovaginal exam: Confirmatory and negative. Extremities: Within normal limits.  Assessment: #1. Mild atrophic vaginitis #2. Osteopenia  Plan: Continue yearly mammograms. Bone density. Pap not done.The new Pap smear guidelines were discussed with the patient.

## 2012-08-24 NOTE — Patient Instructions (Signed)
Schedule bone density.    

## 2012-08-24 NOTE — Addendum Note (Signed)
Addended by: Dayna Barker on: 08/24/2012 12:21 PM   Modules accepted: Orders

## 2012-08-25 LAB — URINALYSIS W MICROSCOPIC + REFLEX CULTURE
Bacteria, UA: NONE SEEN
Casts: NONE SEEN
Glucose, UA: NEGATIVE mg/dL
Hgb urine dipstick: NEGATIVE
Ketones, ur: NEGATIVE mg/dL
Leukocytes, UA: NEGATIVE
Nitrite: NEGATIVE
Protein, ur: NEGATIVE mg/dL
pH: 7.5 (ref 5.0–8.0)

## 2012-10-12 ENCOUNTER — Other Ambulatory Visit: Payer: Self-pay | Admitting: Obstetrics and Gynecology

## 2012-10-12 ENCOUNTER — Other Ambulatory Visit: Payer: Self-pay | Admitting: Women's Health

## 2012-10-12 DIAGNOSIS — M8580 Other specified disorders of bone density and structure, unspecified site: Secondary | ICD-10-CM

## 2012-11-16 ENCOUNTER — Ambulatory Visit (INDEPENDENT_AMBULATORY_CARE_PROVIDER_SITE_OTHER): Payer: 59

## 2012-11-16 ENCOUNTER — Other Ambulatory Visit: Payer: Self-pay | Admitting: Gynecology

## 2012-11-16 DIAGNOSIS — M899 Disorder of bone, unspecified: Secondary | ICD-10-CM

## 2012-11-16 DIAGNOSIS — M8580 Other specified disorders of bone density and structure, unspecified site: Secondary | ICD-10-CM

## 2012-11-17 ENCOUNTER — Encounter: Payer: Self-pay | Admitting: Gynecology

## 2013-09-25 ENCOUNTER — Encounter: Payer: Self-pay | Admitting: Gynecology

## 2013-09-25 ENCOUNTER — Ambulatory Visit (INDEPENDENT_AMBULATORY_CARE_PROVIDER_SITE_OTHER): Payer: 59 | Admitting: Gynecology

## 2013-09-25 VITALS — BP 124/80 | Ht 64.0 in | Wt 131.0 lb

## 2013-09-25 DIAGNOSIS — M8580 Other specified disorders of bone density and structure, unspecified site: Secondary | ICD-10-CM

## 2013-09-25 DIAGNOSIS — N952 Postmenopausal atrophic vaginitis: Secondary | ICD-10-CM

## 2013-09-25 DIAGNOSIS — Z23 Encounter for immunization: Secondary | ICD-10-CM

## 2013-09-25 DIAGNOSIS — M899 Disorder of bone, unspecified: Secondary | ICD-10-CM

## 2013-09-25 DIAGNOSIS — Z01419 Encounter for gynecological examination (general) (routine) without abnormal findings: Secondary | ICD-10-CM

## 2013-09-25 LAB — CBC WITH DIFFERENTIAL/PLATELET
Basophils Relative: 0 % (ref 0–1)
Eosinophils Absolute: 0 10*3/uL (ref 0.0–0.7)
Eosinophils Relative: 0 % (ref 0–5)
HCT: 40.2 % (ref 36.0–46.0)
Hemoglobin: 13.5 g/dL (ref 12.0–15.0)
MCV: 87 fL (ref 78.0–100.0)
Neutrophils Relative %: 72 % (ref 43–77)
Platelets: 296 10*3/uL (ref 150–400)
RBC: 4.62 MIL/uL (ref 3.87–5.11)
WBC: 5.6 10*3/uL (ref 4.0–10.5)

## 2013-09-25 LAB — COMPREHENSIVE METABOLIC PANEL
ALT: 15 U/L (ref 0–35)
Alkaline Phosphatase: 76 U/L (ref 39–117)
Creat: 0.81 mg/dL (ref 0.50–1.10)
Glucose, Bld: 83 mg/dL (ref 70–99)
Sodium: 140 mEq/L (ref 135–145)
Total Bilirubin: 0.4 mg/dL (ref 0.3–1.2)
Total Protein: 7 g/dL (ref 6.0–8.3)

## 2013-09-25 LAB — LIPID PANEL
LDL Cholesterol: 141 mg/dL — ABNORMAL HIGH (ref 0–99)
Total CHOL/HDL Ratio: 4.4 Ratio
Triglycerides: 135 mg/dL (ref <150)
VLDL: 27 mg/dL (ref 0–40)

## 2013-09-25 MED ORDER — ESTRADIOL 10 MCG VA TABS: ORAL_TABLET | VAGINAL | Status: DC

## 2013-09-25 NOTE — Patient Instructions (Signed)
Start on the Vagifem nightly for 14 days and then twice weekly. Call me if you have any questions or issues. Followup for mammogram this year Followup for annual exam in one year

## 2013-09-25 NOTE — Addendum Note (Signed)
Addended by: Dayna Barker on: 09/25/2013 11:52 AM   Modules accepted: Orders

## 2013-09-25 NOTE — Progress Notes (Signed)
Monica [name] 06-Jul-1956 [id number]        57 y.o.  [id number] for annual exam.  Former patient of Dr. Eda Paschal. Several issues noted below.  Past medical history,surgical history, problem list, medications, allergies, family history and social history were all reviewed and documented in the EPIC chart.  ROS:  Performed and pertinent positives and negatives are included in the history, assessment and plan .  Exam: Kim assistant Filed Vitals:   09/25/13 1056  BP: 124/80  Height: 5\' 4"  (1.626 m)  Weight: 131 lb (59.421 kg)   General appearance  Normal Skin grossly normal Head/Neck normal with no cervical or supraclavicular adenopathy thyroid normal Lungs  clear Cardiac RR, without RMG Abdominal  soft, nontender, without masses, organomegaly or hernia Breasts  examined lying and sitting without masses, retractions, discharge or axillary adenopathy. Pelvic  Ext/BUS/vagina  Normal with atrophic changes  Cervix  Normal with atrophic changes  Uterus  anteverted, normal size, shape and contour, midline and mobile nontender   Adnexa  Without masses or tenderness    Anus and perineum  Normal   Rectovaginal  Normal sphincter tone without palpated masses or tenderness.    Assessment/Plan:  57 y.o. [name] female for annual exam.   1. Postmenopausal/atrophic genital changes.  Patient does have issues with hot flashes and night sweats as well as vaginal dryness and dyspareunia. No vaginal bleeding. I reviewed options to include OTC products as well as full HRT, vaginal estrogen and Osphena.  I reviewed the whole issue of HRT with her to include the WHI study with increased risk of stroke, heart attack, DVT and breast cancer. The ACOG and NAMS statements for lowest dose for the shortest period of time reviewed. Transdermal versus oral first-pass effect benefit discussed. After a lengthy discussion the patient does want to try Vagifem. We'll plan nightly for 14 days then twice weekly and see how  she does with this. Patient will call me if she has any issues at all. 2. Osteopenia. DEXA 11/2012 with T score -2.1 FRAX 13%/2.1%. Check vitamin D level today. Increase calcium and vitamin D recommendations reviewed. Plan repeat DEXA at two-year interval. 3. Pap smear 2012. No Pap smear done today. No history of significant abnormal Pap smears previously. Plan repeat Pap smear next year a 3 year interval. 4. Mammography 07/2012. Patient to do now and continue with annual mammography. SBE monthly reviewed. 5. Colonoscopy 3-4 years ago. Repeat at their recommended interval. 6. Health maintenance. Patient is scheduling an exam with her primary physician and asked about doing basic blood work I ordered a CBC comprehensive metabolic panel lipid profile urinalysis TSH and vitamin D level. Followup one year, sooner if any questions particularly pertaining to the Vagifem.   Note: This document was prepared with digital dictation and possible smart phrase technology. Any transcriptional errors that result from this process are unintentional.   Dara Lords MD, 11:23 AM 09/25/2013

## 2013-09-26 LAB — URINALYSIS W MICROSCOPIC + REFLEX CULTURE
Bacteria, UA: NONE SEEN
Hgb urine dipstick: NEGATIVE
Ketones, ur: NEGATIVE mg/dL
Leukocytes, UA: NEGATIVE
Nitrite: NEGATIVE
Urobilinogen, UA: 0.2 mg/dL (ref 0.0–1.0)

## 2013-09-26 LAB — VITAMIN D 25 HYDROXY (VIT D DEFICIENCY, FRACTURES): Vit D, 25-Hydroxy: 28 ng/mL — ABNORMAL LOW (ref 30–89)

## 2013-09-27 ENCOUNTER — Other Ambulatory Visit: Payer: Self-pay | Admitting: Gynecology

## 2013-09-27 DIAGNOSIS — E559 Vitamin D deficiency, unspecified: Secondary | ICD-10-CM

## 2013-09-27 DIAGNOSIS — E7800 Pure hypercholesterolemia, unspecified: Secondary | ICD-10-CM

## 2013-10-03 ENCOUNTER — Encounter: Payer: Self-pay | Admitting: Obstetrics and Gynecology

## 2014-07-08 ENCOUNTER — Ambulatory Visit (HOSPITAL_COMMUNITY)
Admission: RE | Admit: 2014-07-08 | Discharge: 2014-07-08 | Disposition: A | Discharge status: Home / Self Care | Payer: 59 | Source: Ambulatory Visit | Attending: Rheumatology | Admitting: Rheumatology

## 2014-07-08 DIAGNOSIS — G8929 Other chronic pain: Secondary | ICD-10-CM | POA: Diagnosis not present

## 2014-07-08 DIAGNOSIS — IMO0001 Reserved for inherently not codable concepts without codable children: Secondary | ICD-10-CM | POA: Insufficient documentation

## 2014-07-08 DIAGNOSIS — R29898 Other symptoms and signs involving the musculoskeletal system: Secondary | ICD-10-CM | POA: Insufficient documentation

## 2014-07-08 DIAGNOSIS — R262 Difficulty in walking, not elsewhere classified: Secondary | ICD-10-CM | POA: Diagnosis not present

## 2014-07-08 DIAGNOSIS — M545 Low back pain, unspecified: Secondary | ICD-10-CM | POA: Diagnosis not present

## 2014-07-08 NOTE — Evaluation (Addendum)
Physical Therapy Evaluation  Patient Details  Name: Monica [name] MRN: [id number] Date of Birth: [birth year]  Today's Date: 07/08/2014 Time: 1020-1100 PT Time Calculation (min): 40 min Charge;  Evaluation              Visit#: 1 of 8  Re-eval: 08/07/14 Assessment Diagnosis: lumbar radiculopathy  Prior Therapy: none  Authorization: [employer]    Past Medical History:  Past Medical History  Diagnosis Date  . Reiters syndrome   . Raynaud disease   . Fibromyalgia   . Arthritis   . Osteopenia 11/2012    T score -2.1 FRAX 13%/2.1%  . Macular degeneration     Left eye  . Vaginal dryness   . Sleep disturbance    Past Surgical History:  Past Surgical History  Procedure Laterality Date  . Breast surgery      LEFT BR LUMP    Subjective Symptoms/Limitations Symptoms: Pt states that she does not hurt all the time but if she sits for awhile or walks she notices more pain in her leg.  She has tingling and pain that goes down her Ltt leg to the ankle.  She states that she had x-rays that shows arthritis in both her neck and low back.  She has been diagnosed with fibromyalgia.  The pain comes and goes and recently the pain has been better.   Pertinent History: fibromyalgia.  How long can you sit comfortably?: 5-10 minutes and pt will have tingling going down her leg.  How long can you stand comfortably?: 30 minutes and her low back feels tired.  How long can you walk comfortably?: Pt walks for a mile  twice a day at least 3-5 times a week; walking will get her back tired and increased pain after 10 minutes  Pain Assessment Pain Score: 2  (worst 8/10; best 0/10 ) Pain Location: Back Pain Orientation: Left Pain Type: Chronic pain Pain Relieving Factors: strenous activity  Effect of Pain on Daily Activities: better   Balance Screening Balance Screen Has the patient fallen in the past 6 months: No  Assessment RLE Strength Right Hip Flexion: 5/5 Right Hip Extension: 3+/5 Right Hip  ABduction: 5/5 Right Knee Flexion: 4/5 Right Knee Extension: 4/5 Right Ankle Dorsiflexion: 4/5 LLE Strength Left Hip Flexion: 4/5 Left Hip Extension: 3+/5 Left Hip ABduction: 3+/5 Left Knee Flexion: 4/5 Left Knee Extension: 4/5 Left Ankle Dorsiflexion: 4/5 Lumbar AROM Lumbar Flexion: decreased 15% Lumbar Extension: wnl  Lumbar - Right Side Bend: wnl Lumbar - Left Side Bend: wnl Lumbar - Right Rotation: decreased 20%  Lumbar - Left Rotation: decreased 20%  foto 63 Exercise/Treatments   Stretches Active Hamstring Stretch: 2 reps;20 seconds Single Knee to Chest Stretch: 2 reps;20 seconds Standing Extension: 5 reps Standing   Seated Other Seated Lumbar Exercises: toe lift x 10  Other Seated Lumbar Exercises: axial extension, x 10 Supine Ab Set: 10 reps Bent Knee Raise: 10 reps Bridge: 10 reps  Physical Therapy Assessment and Plan PT Assessment and Plan Clinical Impression Statement:  Ms. Glatt has been referred for chronic back pain.  The pt has signs and sx of posterior derangement with exam demonstrating decreased ROM, decreased leg strength and increased pain,.  She will benefit from skilled PT to address these issues and educate her in proper body mechanics . Pt will benefit from skilled therapeutic intervention in order to improve on the following deficits: Decreased strength;Pain;Improper body mechanics;Decreased activity tolerance PT Frequency: Min 2X/week PT Plan: begin stabilization exercise program.  Pt may also benefit from lumbar traction with inital setting set for static at max of 50#.     Goals Home Exercise Program Pt/caregiver will Perform Home Exercise Program: For increased ROM PT Short Term Goals Time to Complete Short Term Goals: 2 weeks PT Short Term Goal 1: Pt to be able to sit for 30 minutes without radicular sx of comfort  PT Short Term Goal 2: Pt to be able to walk for 15 minutes without pain for better health habits  PT Short Term Goal 3: Pt  to be able to demonstrate good body mechanics  PT Long Term Goals Time to Complete Long Term Goals: 4 weeks PT Long Term Goal 1: I in advance HEP  PT Long Term Goal 2: Pt to be able to sit for an hour to enjoy going out to sleep  Long Term Goal 3: Pt to be able to walk for 30 minutes without having pain for better health habits  Long Term Goal 4: no radicular sx  PT Long Term Goal 5: Pt pain to be at the most a 3/10 80% of the time.   Problem List Patient Active Problem List   Diagnosis Date Noted  . Leg weakness 07/08/2014  . Difficulty walking 07/08/2014  . Vaginal dryness   . Reiters syndrome   . Raynaud disease   . Fibromyalgia   . Arthritis   . Osteopenia     PT Plan of Care PT Home Exercise Plan: given  GP    Aahna Rossa,CINDY 07/08/2014, 5:12 PM  Physician Documentation Your signature is required to indicate approval of the treatment plan as stated above.  Please sign and either send electronically or make a copy of this report for your files and return this physician signed original.   Please mark one 1.__approve of plan  2. ___approve of plan with the following conditions.   ______________________________                                                          _____________________ Physician Signature                                                                                                             Date

## 2014-07-10 ENCOUNTER — Ambulatory Visit (HOSPITAL_COMMUNITY)
Admission: RE | Admit: 2014-07-10 | Discharge: 2014-07-10 | Disposition: A | Discharge status: Home / Self Care | Payer: 59 | Source: Ambulatory Visit | Attending: Rheumatology | Admitting: Rheumatology

## 2014-07-10 DIAGNOSIS — IMO0001 Reserved for inherently not codable concepts without codable children: Secondary | ICD-10-CM | POA: Diagnosis not present

## 2014-07-10 DIAGNOSIS — R262 Difficulty in walking, not elsewhere classified: Secondary | ICD-10-CM

## 2014-07-10 DIAGNOSIS — R29898 Other symptoms and signs involving the musculoskeletal system: Secondary | ICD-10-CM

## 2014-07-10 NOTE — Progress Notes (Signed)
Physical Therapy Treatment Patient Details  Name: Monica [name] MRN: [id number] Date of Birth: 1956/08/08  Today's Date: 07/10/2014 Time: 6979-4801 PT Time Calculation (min): 43 min Charge:  There ex [id number] Visit#: 2 of 8  Re-eval: 08/07/14    Authorization: [employer]  Subjective: Symptoms/Limitations Symptoms: Pt states she got to walk today which always makes her feel better.  Pt is currently pain free.  Pain Assessment Currently in Pain?: No/denies     Exercise/Treatments     Stretches Active Hamstring Stretch: 3 reps;30 seconds Single Knee to Chest Stretch: 3 reps;30 seconds Prone on Elbows Stretch: 5 reps (deep breathes with pt concentrating on relaxing low back )   Supine Ab Set: 10 reps Bent Knee Raise: 10 reps Bridge: 10 reps Straight Leg Raise: 5 reps (floating ) Sidelying Clam: 10 reps Hip Abduction: 10 reps    Physical Therapy Assessment and Plan PT Assessment and Plan Clinical Impression Statement: Pt had no radicular sx today therefore traction was not initiated.  Pt needed both verbal and tactile cues to complete stabilization properly. Added new exercises with successful technique after multiple cuing.   PT Plan: begin prone exercises as well as standing t-band exercises;  may begin traction if pt is complaining of radicular sx.      Problem List Patient Active Problem List   Diagnosis Date Noted  . Leg weakness 07/08/2014  . Difficulty walking 07/08/2014  . Vaginal dryness   . Reiters syndrome   . Raynaud disease   . Fibromyalgia   . Arthritis   . Osteopenia        GP    Monica [name],Monica [name] 07/10/2014, 4:44 PM

## 2014-07-15 ENCOUNTER — Ambulatory Visit (HOSPITAL_COMMUNITY)
Admission: RE | Admit: 2014-07-15 | Discharge: 2014-07-15 | Disposition: A | Discharge status: Home / Self Care | Payer: 59 | Source: Ambulatory Visit | Attending: Rheumatology | Admitting: Rheumatology

## 2014-07-15 DIAGNOSIS — R29898 Other symptoms and signs involving the musculoskeletal system: Secondary | ICD-10-CM

## 2014-07-15 DIAGNOSIS — R262 Difficulty in walking, not elsewhere classified: Secondary | ICD-10-CM | POA: Diagnosis not present

## 2014-07-15 DIAGNOSIS — R531 Weakness: Secondary | ICD-10-CM | POA: Diagnosis not present

## 2014-07-15 DIAGNOSIS — M5416 Radiculopathy, lumbar region: Secondary | ICD-10-CM | POA: Insufficient documentation

## 2014-07-15 DIAGNOSIS — Z5189 Encounter for other specified aftercare: Secondary | ICD-10-CM | POA: Diagnosis not present

## 2014-07-15 NOTE — Progress Notes (Signed)
Physical Therapy Treatment Patient Details  Name: Monica [name] MRN: [id number] Date of Birth: Oct 10, 1956  Today's Date: 07/15/2014 Time: 6967-8938 PT Time Calculation (min): 48 min Charge: TE 1017-5102  Visit#: 3 of 8  Re-eval: 08/07/14 Assessment Diagnosis: lumbar radiculopathy  Next MD Visit: Deveshwar every 6 month 11/20/2013 Prior Therapy: none  Authorization: [employer]  Authorization Time Period:    Authorization Visit#:   of     Subjective: Symptoms/Limitations Symptoms: Currently pain free, no current radicular symptoms.  Reported radicular symtoms yesterday following sitting for long periods of time.   Pain Assessment Currently in Pain?: No/denies  Objective:   Exercise/Treatments Stretches Active Hamstring Stretch: 3 reps;30 seconds Prone on Elbows Stretch: 5 reps (concentration on deep breathing) Piriformis Stretch: 2 reps;30 seconds;Limitations Piriformis Stretch Limitations: Bil LE seated position Standing Scapular Retraction: Both;15 reps;Theraband Theraband Level (Scapular Retraction): Level 3 (Green) Row: Both;15 reps;Theraband Theraband Level (Row): Level 3 (Green) Shoulder Extension: Both;10 reps;Theraband Theraband Level (Shoulder Extension): Level 3 (Green) Prone  Straight Leg Raise: 10 reps Other Prone Lumbar Exercises: rows and shoulder extension     Physical Therapy Assessment and Plan PT Assessment and Plan Clinical Impression Statement: Progressed to postural education and strengthening with theraband and prone activtiies.  Pt able to demonstrate appropriate technqiue with all exercises with cueing for form and stability.  Pt c/o tingling Rt arm with POE.  No reports of pain through session.  Pt educated on seated piriformis stretch to complete through out her day when she does experiece LE posterior radicular symptoms.   PT Plan: Reveiew tband exercises, give to pt for HEP if good form and understanding.  Begin prone SAR;  may begin traction if  pt is complaining of radicular sx.     Goals PT Short Term Goals PT Short Term Goal 1: Pt to be able to sit for 30 minutes without radicular sx of comfort  PT Short Term Goal 1 - Progress: Progressing toward goal PT Short Term Goal 2: Pt to be able to walk for 15 minutes without pain for better health habits  PT Short Term Goal 2 - Progress: Progressing toward goal PT Short Term Goal 3: Pt to be able to demonstrate good body mechanics  PT Short Term Goal 3 - Progress: Progressing toward goal PT Long Term Goals PT Long Term Goal 1: I in advance HEP  PT Long Term Goal 2: Pt to be able to sit for an hour to enjoy going out to sleep  Long Term Goal 3: Pt to be able to walk for 30 minutes without having pain for better health habits  Long Term Goal 4: no radicular sx  PT Long Term Goal 5: Pt pain to be at the most a 3/10 80% of the time.   Problem List Patient Active Problem List   Diagnosis Date Noted  . Leg weakness 07/08/2014  . Difficulty walking 07/08/2014  . Vaginal dryness   . Reiters syndrome   . Raynaud disease   . Fibromyalgia   . Arthritis   . Osteopenia     PT - End of Session Activity Tolerance: Patient tolerated treatment well General Behavior During Therapy: WFL for tasks assessed/performed PT Plan of Care PT Home Exercise Plan: seated piriformis st  GP    Aldona Lento 07/15/2014, 3:26 PM

## 2014-07-16 ENCOUNTER — Ambulatory Visit (HOSPITAL_COMMUNITY): Payer: 59 | Admitting: Physical Therapy

## 2014-07-18 ENCOUNTER — Ambulatory Visit (HOSPITAL_COMMUNITY)
Admission: RE | Admit: 2014-07-18 | Discharge: 2014-07-18 | Disposition: A | Discharge status: Home / Self Care | Payer: 59 | Source: Ambulatory Visit | Attending: Family Medicine | Admitting: Family Medicine

## 2014-07-18 DIAGNOSIS — Z5189 Encounter for other specified aftercare: Secondary | ICD-10-CM | POA: Diagnosis not present

## 2014-07-18 NOTE — Progress Notes (Signed)
Physical Therapy Treatment Patient Details  Name: Monica [name] MRN: [id number] Date of Birth: September 19, 1956  Today's Date: 07/18/2014 Time: 1017-1058 PT Time Calculation (min): 41 min TE 4628-6381  Visit#: 4 of 8  Re-eval: 08/07/14    Subjective: Symptoms/Limitations Symptoms: No complaints of pain today.   Pain Assessment Currently in Pain?: No/denies   Exercise/Treatments Stretches Active Hamstring Stretch: 3 reps;30 seconds;Limitations Active Hamstring Stretch Limitations: 14" Box Passive Hamstring Stretch: 3 reps;30 seconds;Limitations Passive Hamstring Stretch Limitations: Gastroc Stretch on Slantboard Single Knee to Chest Stretch: 1 rep;30 seconds Prone on Elbows Stretch: Limitations Prone on Elbows Stretch Limitations: 5" x10 Quad Stretch: 1 rep;30 seconds;Limitations Quad Stretch Limitations: Manual quad stretch in prone Standing Scapular Retraction: Both;15 reps;Theraband Theraband Level (Scapular Retraction): Level 3 (Green) Row: Both;15 reps;Theraband Theraband Level (Row): Level 3 (Green) Shoulder Extension: Both;15 reps;Theraband Theraband Level (Shoulder Extension): Level 3 (Green) Supine Ab Set: 5 seconds;10 reps Bent Knee Raise: 2 seconds;15 reps Bridge: 3 seconds;15 reps Straight Leg Raise: 10 reps Sidelying Hip Abduction: 10 reps Prone  Straight Leg Raise: 10 reps  Physical Therapy Assessment and Plan PT Assessment and Plan Clinical Impression Statement: Updated HEP and reviewed exercises to complete at home.  Pt reports some tingling into the Rt shoulder during exercises in supine (with stretches) and rope was provided to decrease stress/rounding of shoulders during [facility] which decreased symptoms.  Pt reported noting increased weakness on Lt > Rt LE during SLR.   Pt will benefit from skilled therapeutic intervention in order to improve on the following deficits: Decreased strength;Pain;Improper body mechanics;Decreased activity tolerance PT Plan:   Begin prone SAR or quadraped exercises.     Goals PT Short Term Goals PT Short Term Goal 3: Pt to be able to demonstrate good body mechanics  PT Short Term Goal 3 - Progress: Progressing toward goal  Problem List Patient Active Problem List   Diagnosis Date Noted  . Leg weakness 07/08/2014  . Difficulty walking 07/08/2014  . Vaginal dryness   . Reiters syndrome   . Raynaud disease   . Fibromyalgia   . Arthritis   . Osteopenia     PT - End of Session Activity Tolerance: Patient tolerated treatment well General Behavior During Therapy: WFL for tasks assessed/performed PT Plan of Care PT Home Exercise Plan: Updated HEP for HS stretch, SKTC, POE, ab set, bend knee lift, bridge, SLR x3, row, retraction, and shoulder extention with GTB   Helayna Dun 07/18/2014, 11:04 AM

## 2014-07-24 ENCOUNTER — Ambulatory Visit (HOSPITAL_COMMUNITY)
Admission: RE | Admit: 2014-07-24 | Discharge: 2014-07-24 | Disposition: A | Discharge status: Home / Self Care | Payer: 59 | Source: Ambulatory Visit | Attending: Rheumatology | Admitting: Rheumatology

## 2014-07-24 DIAGNOSIS — Z5189 Encounter for other specified aftercare: Secondary | ICD-10-CM | POA: Diagnosis not present

## 2014-07-24 NOTE — Progress Notes (Signed)
Physical Therapy Treatment Patient Details  Name: Monica [name] MRN: [id number] Date of Birth: 06/05/1956  Today's Date: 07/24/2014 Time: 0100-7121 PT Time Calculation (min): 43 min Visit#: 5 of 8  Re-eval: 08/07/14 Charges:  therex 40   Subjective: Symptoms/Limitations Symptoms: Pt states her back is feeling better.  States her Rt wrist is flaired up today due to writers syndrome.  Pt reports compliance with HEP. Pain Assessment Currently in Pain?: No/denies   Exercise/Treatments Stretches Active Hamstring Stretch: 3 reps;30 seconds;Limitations Active Hamstring Stretch Limitations: 14" Box Standing Scapular Retraction: Limitations Scapular Retraction Limitations: theraband held today due to wrist pain (rt writers syndrome) Supine Ab Set: 10 reps Bridge: 3 seconds;15 reps Straight Leg Raise: 10 reps Sidelying Hip Abduction: 10 reps Prone  Single Arm Raise: 10 reps Straight Leg Raise: 10 reps Other Prone Lumbar Exercises: rows and shoulder extension     Physical Therapy Assessment and Plan PT Assessment and Plan Clinical Impression Statement: Completed all exercises and added prone SAR today.  Pt able to complete with minimal VC's.  Held therband exercises today due to arthritis flair up in Rt wrist.  Pt reported more discomfort lateley in cervical region and into UE's more so than lumbar.  REcommended patient return to MD for cervical referral if continues.   PT Plan: Continue to progress towards goals.  Resume theraband exercises when able.     Problem List Patient Active Problem List   Diagnosis Date Noted  . Leg weakness 07/08/2014  . Difficulty walking 07/08/2014  . Vaginal dryness   . Reiters syndrome   . Raynaud disease   . Fibromyalgia   . Arthritis   . Osteopenia     PT - End of Session Activity Tolerance: Patient tolerated treatment well General Behavior During Therapy: WFL for tasks assessed/performed PT Plan of Care PT Home Exercise Plan:  Updated HEP for HS stretch, SKTC, POE, ab set, bend knee lift, bridge, SLR x3, row, retraction, and shoulder extention with GTB   Teena Irani, PTA/CLT 07/24/2014, 4:58 PM

## 2014-07-26 ENCOUNTER — Ambulatory Visit (HOSPITAL_COMMUNITY)
Admission: RE | Admit: 2014-07-26 | Discharge: 2014-07-26 | Disposition: A | Discharge status: Home / Self Care | Payer: 59 | Source: Ambulatory Visit | Attending: Rheumatology | Admitting: Rheumatology

## 2014-07-26 DIAGNOSIS — Z5189 Encounter for other specified aftercare: Secondary | ICD-10-CM | POA: Diagnosis not present

## 2014-07-26 DIAGNOSIS — R262 Difficulty in walking, not elsewhere classified: Secondary | ICD-10-CM

## 2014-07-26 DIAGNOSIS — R29898 Other symptoms and signs involving the musculoskeletal system: Secondary | ICD-10-CM

## 2014-07-26 NOTE — Progress Notes (Signed)
Physical Therapy Treatment Patient Details  Name: Monica [name] MRN: [id number] Date of Birth: [birth year]  Today's Date: 07/26/2014 Time: 7169-6789 PT Time Calculation (min): 40 min   Charges: TE 3810-1751 Visit#: 6 of 8  Re-eval: 08/07/14 Assessment Diagnosis: lumbar radiculopathy  Next MD Visit: Deveshwar every 6 month 11/20/2013 Prior Therapy: none  Authorization: [employer]   Subjective: Symptoms/Limitations Symptoms: Patient states Lt LE pain and low back pain mostly resolved though she notes it increases if she has been on her feet and active for a prolonged period. Patient states compliance with HEP without beign prompted and notes Rt shoulder still occasionally bothers her.  Pain Assessment Currently in Pain?: No/denies Pain Score: 2  Pain Location: Back  Exercise/Treatments Stretches Active Hamstring Stretch: 3 reps;30 seconds;Limitations Active Hamstring Stretch Limitations: 14" Box Prone on Elbows Stretch: Limitations Prone on Elbows Stretch Limitations: 5" x10 Standing Scapular Retraction: Both;15 reps;Theraband Theraband Level (Scapular Retraction): Level 3 (Green) Row: Both;15 reps;Theraband Theraband Level (Row): Level 3 (Green) Shoulder Extension: Both;15 reps;Theraband Theraband Level (Shoulder Extension): Level 3 (Green) Other Standing Lumbar Exercises: Pick up and reach with 3lb dumbbell 5x each Supine Ab Set: 10 reps Bent Knee Raise: 2 seconds;15 reps Bridge: 3 seconds;15 reps Straight Leg Raise: 10 reps Sidelying Hip Abduction: 10 reps Prone  Straight Leg Raise: 10 reps Other Prone Lumbar Exercises: rows and shoulder extension  Physical Therapy Assessment and Plan PT Assessment and Plan Clinical Impression Statement: Reinitiated theraband exercises this session with patient noting no pain durign therapy. Patient stated that she believed her flair up last session was due to her writers syndrome. Utilizing UE driven exercises resulted in decreased  low back and radiatign Lt LE pain as well as decreased complaint of shoudler pain. noted particular limitation in proximal hamstring mobility on Lt.  PT Plan: Continue to progress towards goals.  Progress LE loading exercises. Add 3D hamstring drives    Goals PT Short Term Goals PT Short Term Goal 1: Pt to be able to sit for 30 minutes without radicular sx of comfort  PT Short Term Goal 1 - Progress: Progressing toward goal PT Short Term Goal 2: Pt to be able to walk for 15 minutes without pain for better health habits  PT Short Term Goal 2 - Progress: Progressing toward goal PT Short Term Goal 3: Pt to be able to demonstrate good body mechanics  PT Short Term Goal 3 - Progress: Progressing toward goal PT Long Term Goals PT Long Term Goal 1: I in advance HEP  PT Long Term Goal 1 - Progress: Progressing toward goal PT Long Term Goal 2: Pt to be able to sit for an hour to enjoy going out to sleep  PT Long Term Goal 2 - Progress: Progressing toward goal Long Term Goal 3: Pt to be able to walk for 30 minutes without having pain for better health habits  Long Term Goal 3 Progress: Progressing toward goal Long Term Goal 4: no radicular sx  Long Term Goal 4 Progress: Progressing toward goal PT Long Term Goal 5: Pt pain to be at the most a 3/10 80% of the time.  Long Term Goal 5 Progress: Progressing toward goal  Problem List Patient Active Problem List   Diagnosis Date Noted  . Leg weakness 07/08/2014  . Difficulty walking 07/08/2014  . Vaginal dryness   . Reiters syndrome   . Raynaud disease   . Fibromyalgia   . Arthritis   . Osteopenia     PT -  End of Session Activity Tolerance: Patient tolerated treatment well General Behavior During Therapy: [facility] for tasks assessed/performed  GP    Junious Ragone R 07/26/2014, 5:01 PM

## 2014-07-29 ENCOUNTER — Ambulatory Visit (HOSPITAL_COMMUNITY)
Admission: RE | Admit: 2014-07-29 | Discharge: 2014-07-29 | Disposition: A | Discharge status: Home / Self Care | Payer: 59 | Source: Ambulatory Visit | Attending: Physical Therapy | Admitting: Physical Therapy

## 2014-07-29 DIAGNOSIS — Z5189 Encounter for other specified aftercare: Secondary | ICD-10-CM | POA: Diagnosis not present

## 2014-07-29 NOTE — Progress Notes (Signed)
Physical Therapy Treatment Patient Details  Name: Monica [name] MRN: [id number] Date of Birth: 11-19-1955  Today's Date: 07/29/2014 Time: 8119-1478 PT Time Calculation (min): 35 min TE 2956-2130  Visit#: 7 of 8  Re-eval: 08/07/14 Assessment Diagnosis: lumbar radiculopathy  Next MD Visit: Deveshwar every 6 month 11/20/2013   Subjective: Symptoms/Limitations Symptoms: Pt reprots no complaints of LBP or radicular symptoms in the   Exercise/Treatments Stretches Active Hamstring Stretch: 3 reps;30 seconds;Limitations Active Hamstring Stretch Limitations: 14" Box Piriformis Stretch: 2 reps;30 seconds;Limitations Piriformis Stretch Limitations: Supine Supine Bridge: 3 seconds;15 reps Straight Leg Raise: 20 reps Other Supine Lumbar Exercises: Tabletop Toe Taps 2x5 Sidelying Hip Abduction: 20 reps Prone  Straight Leg Raise: 20 reps Quadruped Opposite Arm/Leg Raise: 5 reps     Physical Therapy Assessment and Plan PT Assessment and Plan Clinical Impression Statement: Pt arrived late to PT visit.  No complaints of LBP prior to or during PT treatment session.  Pt was able to progress reps and exercises in clinic today.  pt did report some discomfort on wrists during bird dog exercise; pt educated on how to transition exercise to sitting on ball with alternating marching/shoulder flexion to work on trunk stability during UE/LE movement if unable to complete exercise in quadraped position due to wrist pain.  Educated pt to assess functional mobility skills (time in sitting, standing, sleeping, walking) and any limitations in daily activity due to LBP (not cervical pain or UE radicular symptoms).  Pt to be re-evaluated next visit for possible d/c to (I) HEP as pain is resolving and pt is (I) with HEP with exercise sheets.  new HEP sheets developed for pt to be given next session.   Pt will benefit from skilled therapeutic intervention in order to improve on the following deficits:  Decreased strength;Pain;Improper body mechanics;Decreased activity tolerance PT Frequency: Min 2X/week PT Plan: Re-eval for possible d/c.  New order was sent to MD requesting rx for cervical spine and Rt UE radicular symptoms (assess if new order has been recieved yet to schedule pt for evaluation).      Goals PT Short Term Goals PT Short Term Goal 3: Pt to be able to demonstrate good body mechanics  PT Short Term Goal 3 - Progress: Progressing toward goal  Problem List Patient Active Problem List   Diagnosis Date Noted  . Leg weakness 07/08/2014  . Difficulty walking 07/08/2014  . Vaginal dryness   . Reiters syndrome   . Raynaud disease   . Fibromyalgia   . Arthritis   . Osteopenia     PT - End of Session Activity Tolerance: Patient tolerated treatment well General Behavior During Therapy: WFL for tasks assessed/performed PT Plan of Care PT Home Exercise Plan: HEP updated   Monica [name] 07/29/2014, 5:07 PM

## 2014-08-01 ENCOUNTER — Ambulatory Visit (HOSPITAL_COMMUNITY)
Admission: RE | Admit: 2014-08-01 | Discharge: 2014-08-01 | Disposition: A | Discharge status: Home / Self Care | Payer: 59 | Source: Ambulatory Visit | Attending: Rheumatology | Admitting: Rheumatology

## 2014-08-01 DIAGNOSIS — R29898 Other symptoms and signs involving the musculoskeletal system: Secondary | ICD-10-CM

## 2014-08-01 DIAGNOSIS — R262 Difficulty in walking, not elsewhere classified: Secondary | ICD-10-CM

## 2014-08-02 NOTE — Progress Notes (Signed)
Physical Therapy Discharge Patient Details  Name: Monica [name] MRN: [id number] Date of Birth: 08-Sep-1956  Today's Date: 08/02/2014 Time: 3267-1245 PT Time Calculation (min): 25 min  Visit#: 8 of 8  Re-eval:   Assessment Diagnosis: lumbar radiculopathy  Next MD Visit: Deveshwar every 6 month 11/20/2013    Subjective: Symptoms/Limitations Symptoms: No complaints of pain today.  How long can you sit comfortably?: Sit unlimited amount of time (was 5-10 minutes) How long can you walk comfortably?: Stand/ambulate for 4-5 hours (was able to walk for 10 minutes) Pain Assessment Currently in Pain?: No/denies Pain Score:  (Best 0/10   Worst 2/10)   08/02/14 0700  Assessment  Diagnosis lumbar radiculopathy   Next MD Visit Deveshwar every 6 month 11/20/2013  Flexibility  90/90 Negative (Rt 85 degrees, Lt 83 degrees)  Functional Tests  Functional Tests [place] negative Rt/Lt to glute  RLE Strength  Right Hip Flexion 5/5 ((was 5/5))  Right Hip Extension (4+/5 (was 3+/5))  Right Hip ABduction 5/5 ((was 5/5))  Right Knee Flexion 5/5 ((was 4/5))  Right Knee Extension 5/5 ((was 4/5))  Right Ankle Dorsiflexion 5/5 ((was 4/5))  LLE Strength  Left Hip Flexion (4+/5 (was 4/5))  Left Hip Extension (4+/5 (was 3+/5))  Left Hip ABduction (4+/5 (was 3+/5))  Left Knee Flexion 5/5 ((was 4/5))  Left Knee Extension 5/5 ((was 4/5))  Left Ankle Dorsiflexion 5/5 ((was 4/5))  Lumbar Assessment  Lumbar Assessment X  Lumbar AROM  Lumbar Flexion to floor (was decreased 15%)  Lumbar Extension WNL 35 degrees (was WNL)  Lumbar - Right Side Bend 16 in fingertip to floor  Lumbar - Left Side Bend 16 1/4 inch fingertip to floor  Lumbar Strength  Lumbar Flexion 3+/5  Lumbar Extension 3+/5  Overall Lumbar Strength Comments 3/5 for TA    Physical Therapy Assessment and Plan PT Assessment and Plan Clinical Impression Statement: D/C performed - see measurements for details.  Reviewed HEP and pt  reports no questions or concerns about completion of HEP.  Pt reports most discomfort at this time in the cervcial spine with some radicular symptoms; order faxed to MD and awaiting authorization.  Pt reports improved functional skills without limiitations due to LBP at this time.  All goals met.   PT Plan: D/C to (I) HEP    Goals PT Short Term Goals PT Short Term Goal 1: Pt to be able to sit for 30 minutes without radicular sx of comfort  PT Short Term Goal 1 - Progress: Met PT Short Term Goal 2: Pt to be able to walk for 15 minutes without pain for better health habits  PT Short Term Goal 2 - Progress: Met PT Short Term Goal 3: Pt to be able to demonstrate good body mechanics  PT Short Term Goal 3 - Progress: Met PT Long Term Goals PT Long Term Goal 1: I in advance HEP  PT Long Term Goal 1 - Progress: Met PT Long Term Goal 2: Pt to be able to sit for an hour to enjoy going out to sleep  PT Long Term Goal 2 - Progress: Met Long Term Goal 3: Pt to be able to walk for 30 minutes without having pain for better health habits  Long Term Goal 3 Progress: Met Long Term Goal 4: no radicular sx  Long Term Goal 4 Progress: Met PT Long Term Goal 5: Pt pain to be at the most a 3/10 80% of the time.  Long Term Goal 5 Progress: Met  Problem List Patient Active Problem List   Diagnosis Date Noted  . Leg weakness 07/08/2014  . Difficulty walking 07/08/2014  . Vaginal dryness   . Reiters syndrome   . Raynaud disease   . Fibromyalgia   . Arthritis   . Osteopenia        [name] 08/02/2014, 8:10 AM

## 2014-08-07 ENCOUNTER — Ambulatory Visit (HOSPITAL_COMMUNITY): Payer: 59

## 2014-08-09 ENCOUNTER — Ambulatory Visit (HOSPITAL_COMMUNITY): Payer: 59

## 2014-08-12 ENCOUNTER — Encounter: Payer: Self-pay | Admitting: Gynecology

## 2014-09-19 ENCOUNTER — Other Ambulatory Visit (HOSPITAL_COMMUNITY): Payer: Self-pay | Admitting: Internal Medicine

## 2014-09-19 ENCOUNTER — Other Ambulatory Visit: Payer: Self-pay | Admitting: Gynecology

## 2014-09-19 DIAGNOSIS — Z1231 Encounter for screening mammogram for malignant neoplasm of breast: Secondary | ICD-10-CM

## 2014-09-30 ENCOUNTER — Ambulatory Visit (HOSPITAL_COMMUNITY)
Admission: RE | Admit: 2014-09-30 | Discharge: 2014-09-30 | Disposition: A | Discharge status: Home / Self Care | Payer: 59 | Source: Ambulatory Visit | Attending: Gynecology | Admitting: Gynecology

## 2014-09-30 DIAGNOSIS — Z1231 Encounter for screening mammogram for malignant neoplasm of breast: Secondary | ICD-10-CM | POA: Diagnosis present

## 2014-11-13 ENCOUNTER — Other Ambulatory Visit (HOSPITAL_COMMUNITY)
Admission: RE | Admit: 2014-11-13 | Discharge: 2014-11-13 | Disposition: A | Discharge status: Home / Self Care | Payer: 59 | Source: Ambulatory Visit | Attending: Gynecology | Admitting: Gynecology

## 2014-11-13 ENCOUNTER — Encounter: Payer: Self-pay | Admitting: Gynecology

## 2014-11-13 ENCOUNTER — Ambulatory Visit (INDEPENDENT_AMBULATORY_CARE_PROVIDER_SITE_OTHER): Payer: 59 | Admitting: Gynecology

## 2014-11-13 VITALS — BP 124/84 | Ht 64.0 in | Wt 132.0 lb

## 2014-11-13 DIAGNOSIS — Z01419 Encounter for gynecological examination (general) (routine) without abnormal findings: Secondary | ICD-10-CM | POA: Insufficient documentation

## 2014-11-13 DIAGNOSIS — Z23 Encounter for immunization: Secondary | ICD-10-CM

## 2014-11-13 DIAGNOSIS — N952 Postmenopausal atrophic vaginitis: Secondary | ICD-10-CM

## 2014-11-13 DIAGNOSIS — M8580 Other specified disorders of bone density and structure, unspecified site: Secondary | ICD-10-CM

## 2014-11-13 LAB — COMPREHENSIVE METABOLIC PANEL
ALBUMIN: 3.9 g/dL (ref 3.5–5.2)
ALT: 16 U/L (ref 0–35)
AST: 23 U/L (ref 0–37)
Alkaline Phosphatase: 73 U/L (ref 39–117)
BILIRUBIN TOTAL: 0.4 mg/dL (ref 0.2–1.2)
BUN: 9 mg/dL (ref 6–23)
CO2: 28 mEq/L (ref 19–32)
CREATININE: 0.76 mg/dL (ref 0.50–1.10)
Calcium: 9.5 mg/dL (ref 8.4–10.5)
Chloride: 104 mEq/L (ref 96–112)
GLUCOSE: 82 mg/dL (ref 70–99)
Potassium: 3.8 mEq/L (ref 3.5–5.3)
Sodium: 141 mEq/L (ref 135–145)
Total Protein: 6.4 g/dL (ref 6.0–8.3)

## 2014-11-13 LAB — LIPID PANEL
Cholesterol: 207 mg/dL — ABNORMAL HIGH (ref 0–200)
HDL: 52 mg/dL (ref >39)
LDL CALC: 138 mg/dL — AB (ref 0–99)
Total CHOL/HDL Ratio: 4 Ratio
Triglycerides: 87 mg/dL (ref <150)
VLDL: 17 mg/dL (ref 0–40)

## 2014-11-13 LAB — CBC WITH DIFFERENTIAL/PLATELET
Basophils Absolute: 0 10*3/uL (ref 0.0–0.1)
Basophils Relative: 0 % (ref 0–1)
EOS PCT: 1 % (ref 0–5)
Eosinophils Absolute: 0 10*3/uL (ref 0.0–0.7)
HEMATOCRIT: 39.9 % (ref 36.0–46.0)
Hemoglobin: 13.2 g/dL (ref 12.0–15.0)
Lymphocytes Relative: 36 % (ref 12–46)
Lymphs Abs: 1.4 10*3/uL (ref 0.7–4.0)
MCH: 29.1 pg (ref 26.0–34.0)
MCHC: 33.1 g/dL (ref 30.0–36.0)
MCV: 88.1 fL (ref 78.0–100.0)
MPV: 8.5 fL — ABNORMAL LOW (ref 8.6–12.4)
Monocytes Absolute: 0.4 10*3/uL (ref 0.1–1.0)
Monocytes Relative: 10 % (ref 3–12)
NEUTROS PCT: 53 % (ref 43–77)
Neutro Abs: 2.1 10*3/uL (ref 1.7–7.7)
PLATELETS: 285 10*3/uL (ref 150–400)
RBC: 4.53 MIL/uL (ref 3.87–5.11)
RDW: 13.1 % (ref 11.5–15.5)
WBC: 4 10*3/uL (ref 4.0–10.5)

## 2014-11-13 MED ORDER — ESTRADIOL 10 MCG VA TABS: 1.0000 | ORAL_TABLET | VAGINAL | Status: DC

## 2014-11-13 NOTE — Addendum Note (Signed)
Addended by: Nelva Nay on: 11/13/2014 11:52 AM   Modules accepted: Orders

## 2014-11-13 NOTE — Addendum Note (Signed)
Addended by: Nelva Nay on: 11/13/2014 12:03 PM   Modules accepted: Orders

## 2014-11-13 NOTE — Addendum Note (Signed)
Addended by: Federico Flake on: 11/13/2014 12:09 PM   Modules accepted: Orders

## 2014-11-13 NOTE — Patient Instructions (Signed)
Continue to use the Vagifem twice weekly as we discussed.  You may obtain a copy of any labs that were done today by logging onto MyChart as outlined in the instructions provided with your AVS (after visit summary). The office will not call with normal lab results but certainly if there are any significant abnormalities then we will contact you.   Health Maintenance, Female A healthy lifestyle and preventative care can promote health and wellness.  Maintain regular health, dental, and eye exams.  Eat a healthy diet. Foods like vegetables, fruits, whole grains, low-fat dairy products, and lean protein foods contain the nutrients you need without too many calories. Decrease your intake of foods high in solid fats, added sugars, and salt. Get information about a proper diet from your caregiver, if necessary.  Regular physical exercise is one of the most important things you can do for your health. Most adults should get at least 150 minutes of moderate-intensity exercise (any activity that increases your heart rate and causes you to sweat) each week. In addition, most adults need muscle-strengthening exercises on 2 or more days a week.   Maintain a healthy weight. The body mass index (BMI) is a screening tool to identify possible weight problems. It provides an estimate of body fat based on height and weight. Your caregiver can help determine your BMI, and can help you achieve or maintain a healthy weight. For adults 20 years and older:  A BMI below 18.5 is considered underweight.  A BMI of 18.5 to 24.9 is normal.  A BMI of 25 to 29.9 is considered overweight.  A BMI of 30 and above is considered obese.  Maintain normal blood lipids and cholesterol by exercising and minimizing your intake of saturated fat. Eat a balanced diet with plenty of fruits and vegetables. Blood tests for lipids and cholesterol should begin at age 71 and be repeated every 5 years. If your lipid or cholesterol levels are  high, you are over 50, or you are a high risk for heart disease, you may need your cholesterol levels checked more frequently.Ongoing high lipid and cholesterol levels should be treated with medicines if diet and exercise are not effective.  If you smoke, find out from your caregiver how to quit. If you do not use tobacco, do not start.  Lung cancer screening is recommended for adults aged 37 80 years who are at high risk for developing lung cancer because of a history of smoking. Yearly low-dose computed tomography (CT) is recommended for people who have at least a 30-pack-year history of smoking and are a current smoker or have quit within the past 15 years. A pack year of smoking is smoking an average of 1 pack of cigarettes a day for 1 year (for example: 1 pack a day for 30 years or 2 packs a day for 15 years). Yearly screening should continue until the smoker has stopped smoking for at least 15 years. Yearly screening should also be stopped for people who develop a health problem that would prevent them from having lung cancer treatment.  If you are pregnant, do not drink alcohol. If you are breastfeeding, be very cautious about drinking alcohol. If you are not pregnant and choose to drink alcohol, do not exceed 1 drink per day. One drink is considered to be 12 ounces (355 mL) of beer, 5 ounces (148 mL) of wine, or 1.5 ounces (44 mL) of liquor.  Avoid use of street drugs. Do not share needles with anyone.  Ask for help if you need support or instructions about stopping the use of drugs.  High blood pressure causes heart disease and increases the risk of stroke. Blood pressure should be checked at least every 1 to 2 years. Ongoing high blood pressure should be treated with medicines, if weight loss and exercise are not effective.  If you are 88 to 58 years old, ask your caregiver if you should take aspirin to prevent strokes.  Diabetes screening involves taking a blood sample to check your fasting  blood sugar level. This should be done once every 3 years, after age 77, if you are within normal weight and without risk factors for diabetes. Testing should be considered at a younger age or be carried out more frequently if you are overweight and have at least 1 risk factor for diabetes.  Breast cancer screening is essential preventative care for women. You should practice "breast self-awareness." This means understanding the normal appearance and feel of your breasts and may include breast self-examination. Any changes detected, no matter how small, should be reported to a caregiver. Women in their 26s and 30s should have a clinical breast exam (CBE) by a caregiver as part of a regular health exam every 1 to 3 years. After age 32, women should have a CBE every year. Starting at age 34, women should consider having a mammogram (breast X-ray) every year. Women who have a family history of breast cancer should talk to their caregiver about genetic screening. Women at a high risk of breast cancer should talk to their caregiver about having an MRI and a mammogram every year.  Breast cancer gene (BRCA)-related cancer risk assessment is recommended for women who have family members with BRCA-related cancers. BRCA-related cancers include breast, ovarian, tubal, and peritoneal cancers. Having family members with these cancers may be associated with an increased risk for harmful changes (mutations) in the breast cancer genes BRCA1 and BRCA2. Results of the assessment will determine the need for genetic counseling and BRCA1 and BRCA2 testing.  The Pap test is a screening test for cervical cancer. Women should have a Pap test starting at age 28. Between ages 48 and 67, Pap tests should be repeated every 2 years. Beginning at age 20, you should have a Pap test every 3 years as long as the past 3 Pap tests have been normal. If you had a hysterectomy for a problem that was not cancer or a condition that could lead to  cancer, then you no longer need Pap tests. If you are between ages 55 and 63, and you have had normal Pap tests going back 10 years, you no longer need Pap tests. If you have had past treatment for cervical cancer or a condition that could lead to cancer, you need Pap tests and screening for cancer for at least 20 years after your treatment. If Pap tests have been discontinued, risk factors (such as a new sexual partner) need to be reassessed to determine if screening should be resumed. Some women have medical problems that increase the chance of getting cervical cancer. In these cases, your caregiver may recommend more frequent screening and Pap tests.  The human papillomavirus (HPV) test is an additional test that may be used for cervical cancer screening. The HPV test looks for the virus that can cause the cell changes on the cervix. The cells collected during the Pap test can be tested for HPV. The HPV test could be used to screen women aged 28 years  and older, and should be used in women of any age who have unclear Pap test results. After the age of 85, women should have HPV testing at the same frequency as a Pap test.  Colorectal cancer can be detected and often prevented. Most routine colorectal cancer screening begins at the age of 61 and continues through age 55. However, your caregiver may recommend screening at an earlier age if you have risk factors for colon cancer. On a yearly basis, your caregiver may provide home test kits to check for hidden blood in the stool. Use of a small camera at the end of a tube, to directly examine the colon (sigmoidoscopy or colonoscopy), can detect the earliest forms of colorectal cancer. Talk to your caregiver about this at age 56, when routine screening begins. Direct examination of the colon should be repeated every 5 to 10 years through age 20, unless early forms of pre-cancerous polyps or small growths are found.  Hepatitis C blood testing is recommended for  all people born from [birth year] through 1965 and any individual with known risks for hepatitis C.  Practice safe sex. Use condoms and avoid high-risk sexual practices to reduce the spread of sexually transmitted infections (STIs). Sexually active women aged 68 and younger should be checked for Chlamydia, which is a common sexually transmitted infection. Older women with new or multiple partners should also be tested for Chlamydia. Testing for other STIs is recommended if you are sexually active and at increased risk.  Osteoporosis is a disease in which the bones lose minerals and strength with aging. This can result in serious bone fractures. The risk of osteoporosis can be identified using a bone density scan. Women ages 42 and over and women at risk for fractures or osteoporosis should discuss screening with their caregivers. Ask your caregiver whether you should be taking a calcium supplement or vitamin D to reduce the rate of osteoporosis.  Menopause can be associated with physical symptoms and risks. Hormone replacement therapy is available to decrease symptoms and risks. You should talk to your caregiver about whether hormone replacement therapy is right for you.  Use sunscreen. Apply sunscreen liberally and repeatedly throughout the day. You should seek shade when your shadow is shorter than you. Protect yourself by wearing long sleeves, pants, a wide-brimmed hat, and sunglasses year round, whenever you are outdoors.  Notify your caregiver of new moles or changes in moles, especially if there is a change in shape or color. Also notify your caregiver if a mole is larger than the size of a pencil eraser.  Stay current with your immunizations. Document Released: 04/12/2011 Document Revised: 01/22/2013 Document Reviewed: 04/12/2011 [facility] Patient Information 2014 [place].

## 2014-11-13 NOTE — Progress Notes (Signed)
Monica [name] 04-01-1956 [id number]        59 y.o.  [id number] for annual exam.  Several issues noted below.  Past medical history,surgical history, problem list, medications, allergies, family history and social history were all reviewed and documented as reviewed in the EPIC chart.  ROS:  Performed with pertinent positives and negatives included in the history, assessment and plan.   Additional significant findings :  none   Exam: Kim Counsellor Vitals:   11/13/14 1117  BP: 124/84  Height: 5\' 4"  (1.626 m)  Weight: 132 lb (59.875 kg)   General appearance:  Normal affect, orientation and appearance. Skin: Grossly normal HEENT: Without gross lesions.  No cervical or supraclavicular adenopathy. Thyroid normal.  Lungs:  Clear without wheezing, rales or rhonchi Cardiac: RR, without RMG Abdominal:  Soft, nontender, without masses, guarding, rebound, organomegaly or hernia Breasts:  Examined lying and sitting without masses, retractions, discharge or axillary adenopathy. Pelvic:  Ext/BUS/vagina with generalized atrophic changes  Cervix with atrophic changes. Pap smear done  Uterus anteverted, normal size, shape and contour, midline and mobile nontender   Adnexa  Without masses or tenderness    Anus and perineum  Normal   Rectovaginal  Normal sphincter tone without palpated masses or tenderness.    Assessment/Plan:  59 y.o. [id number] female for annual exam.   1. Postmenopausal/atrophic genital changes. Patient started on Vagifem 10 g twice weekly last year. Has done well from a symptom relief standpoint but is having a problem with remembering. Options to include switching to twice weekly cream, Osphena and global HRT reviewed. The risks benefits of each choice discussed. Patient prefers just to continue with Vagifem and be more diligent remembering. The absorption issue with stimulation of the endometrium/risks such as stroke heart attack DVT breast cancer reviewed. Refill 1 year  provided. Patient has done no vaginal bleeding and knows importance of reporting any vaginal bleeding. 2. Mammography 09/2014. Continue with annual mammography. SBE monthly reviewed. 3. Osteopenia. DEXA 11/2012 T score -2.1. FRAX 13%/2.1%. Repeat DEXA this year. Check vitamin D level today. Currently taking 1000 units daily. 4. Pap smear 2012. Pap smear done today. No history of significant abnormal Pap smears previously. 5. Colonoscopy 8 years ago with planned repeat at 10 years per history. 6. Health maintenance. Patient asked for baseline labs. CBC comprehensive metabolic panel lipid profile urinalysis TSH vitamin D ordered. Follow up for bone density otherwise in one year, sooner as needed.     [name] MD, 11:48 AM 11/13/2014

## 2014-11-14 LAB — URINALYSIS W MICROSCOPIC + REFLEX CULTURE
BACTERIA UA: NONE SEEN
BILIRUBIN URINE: NEGATIVE
CASTS: NONE SEEN
Crystals: NONE SEEN
Glucose, UA: NEGATIVE mg/dL
Hgb urine dipstick: NEGATIVE
KETONES UR: NEGATIVE mg/dL
Leukocytes, UA: NEGATIVE
NITRITE: NEGATIVE
PH: 6.5 (ref 5.0–8.0)
Protein, ur: NEGATIVE mg/dL
Specific Gravity, Urine: 1.006 (ref 1.005–1.030)
Urobilinogen, UA: 0.2 mg/dL (ref 0.0–1.0)

## 2014-11-14 LAB — TSH: TSH: 2.696 u[IU]/mL (ref 0.350–4.500)

## 2014-11-14 LAB — VITAMIN D 25 HYDROXY (VIT D DEFICIENCY, FRACTURES): Vit D, 25-Hydroxy: 34 ng/mL (ref 30–100)

## 2014-11-15 ENCOUNTER — Other Ambulatory Visit: Payer: Self-pay | Admitting: Gynecology

## 2014-11-15 ENCOUNTER — Telehealth: Payer: Self-pay

## 2014-11-15 DIAGNOSIS — E7800 Pure hypercholesterolemia, unspecified: Secondary | ICD-10-CM

## 2014-11-15 LAB — CYTOLOGY - PAP

## 2014-11-15 NOTE — Telephone Encounter (Signed)
Patient informed. 

## 2014-11-15 NOTE — Telephone Encounter (Signed)
I would add another 1000 units

## 2014-11-15 NOTE — Telephone Encounter (Signed)
-----   Message from [name], MD sent at 11/14/2014  9:33 AM EST ----- Tell patient: #1  Vitamin D in the lower range of normal. Recommend supplemental thousand units daily OTC vitamin D #2  Cholesterol and LDL are elevated. If this was not fasting recommended fasting lipid profile. If it was fasting then I would make a copy from the [facility] and show it to her primary physician.

## 2014-11-15 NOTE — Telephone Encounter (Signed)
Patient informed of results below.  She said she is already taking a Vit D supplement and she believes it to be 2000 units. What to rec?

## 2014-12-10 DIAGNOSIS — M8580 Other specified disorders of bone density and structure, unspecified site: Secondary | ICD-10-CM

## 2014-12-10 HISTORY — DX: Other specified disorders of bone density and structure, unspecified site: M85.80

## 2014-12-19 ENCOUNTER — Ambulatory Visit (INDEPENDENT_AMBULATORY_CARE_PROVIDER_SITE_OTHER): Payer: 59

## 2014-12-19 ENCOUNTER — Other Ambulatory Visit: Payer: Self-pay | Admitting: Gynecology

## 2014-12-19 ENCOUNTER — Other Ambulatory Visit: Payer: 59

## 2014-12-19 ENCOUNTER — Encounter: Payer: Self-pay | Admitting: Gynecology

## 2014-12-19 ENCOUNTER — Telehealth: Payer: Self-pay | Admitting: Gynecology

## 2014-12-19 DIAGNOSIS — M8580 Other specified disorders of bone density and structure, unspecified site: Secondary | ICD-10-CM | POA: Diagnosis not present

## 2014-12-19 DIAGNOSIS — E7800 Pure hypercholesterolemia, unspecified: Secondary | ICD-10-CM

## 2014-12-19 DIAGNOSIS — Z13820 Encounter for screening for osteoporosis: Secondary | ICD-10-CM

## 2014-12-19 LAB — LIPID PANEL
CHOL/HDL RATIO: 4.6 ratio
CHOLESTEROL: 224 mg/dL — AB (ref 0–200)
HDL: 49 mg/dL (ref >=46)
LDL Cholesterol: 163 mg/dL — ABNORMAL HIGH (ref 0–99)
TRIGLYCERIDES: 62 mg/dL (ref <150)
VLDL: 12 mg/dL (ref 0–40)

## 2014-12-19 NOTE — Telephone Encounter (Signed)
Tell patient her bone density does show some loss at her spine but stability at the hips. Does not indicate need for treatment yet but there was loss. I would recommend supplementing vitamin D 1000 units daily as her vitamin D level although in the normal range was at the lower end. Plan to repeat bone density in 2 years.

## 2014-12-19 NOTE — Telephone Encounter (Signed)
Pt informed with the below note. 

## 2015-09-10 ENCOUNTER — Other Ambulatory Visit: Payer: Self-pay | Admitting: Gynecology

## 2015-09-10 DIAGNOSIS — N644 Mastodynia: Secondary | ICD-10-CM

## 2015-10-07 ENCOUNTER — Other Ambulatory Visit (HOSPITAL_COMMUNITY): Payer: Self-pay | Admitting: Family Medicine

## 2015-10-07 ENCOUNTER — Ambulatory Visit (HOSPITAL_COMMUNITY)
Admission: RE | Admit: 2015-10-07 | Discharge: 2015-10-07 | Disposition: A | Discharge status: Home / Self Care | Payer: 59 | Source: Ambulatory Visit | Attending: Family Medicine | Admitting: Family Medicine

## 2015-10-07 ENCOUNTER — Ambulatory Visit (HOSPITAL_COMMUNITY)
Admission: RE | Admit: 2015-10-07 | Discharge: 2015-10-07 | Disposition: A | Discharge status: Home / Self Care | Payer: 59 | Source: Ambulatory Visit | Attending: Gynecology | Admitting: Gynecology

## 2015-10-07 DIAGNOSIS — R52 Pain, unspecified: Secondary | ICD-10-CM

## 2015-10-07 DIAGNOSIS — N644 Mastodynia: Secondary | ICD-10-CM | POA: Insufficient documentation

## 2015-11-17 ENCOUNTER — Encounter: Payer: Self-pay | Admitting: Gynecology

## 2015-11-17 ENCOUNTER — Ambulatory Visit (INDEPENDENT_AMBULATORY_CARE_PROVIDER_SITE_OTHER): Payer: 59 | Admitting: Gynecology

## 2015-11-17 VITALS — BP 120/76 | Ht 64.0 in | Wt 138.0 lb

## 2015-11-17 DIAGNOSIS — N952 Postmenopausal atrophic vaginitis: Secondary | ICD-10-CM | POA: Diagnosis not present

## 2015-11-17 DIAGNOSIS — M8580 Other specified disorders of bone density and structure, unspecified site: Secondary | ICD-10-CM | POA: Diagnosis not present

## 2015-11-17 DIAGNOSIS — Z01419 Encounter for gynecological examination (general) (routine) without abnormal findings: Secondary | ICD-10-CM

## 2015-11-17 MED ORDER — ESTRADIOL 10 MCG VA TABS: ORAL_TABLET | VAGINAL | Status: DC

## 2015-11-17 NOTE — Patient Instructions (Signed)

## 2015-11-17 NOTE — Progress Notes (Signed)
RETHER ISAIAH May 25, 1956 [id number]        60 y.o.  [id number]  for annual exam.  Doing well.  Past medical history,surgical history, problem list, medications, allergies, family history and social history were all reviewed and documented as reviewed in the EPIC chart.  ROS:  Performed with pertinent positives and negatives included in the history, assessment and plan.   Additional significant findings :  none   Exam: [name] assistant Filed Vitals:   11/17/15 1021  BP: 120/76  Height: 5\' 4"  (1.626 m)  Weight: 138 lb (62.596 kg)   General appearance:  Normal affect, orientation and appearance. Skin: Grossly normal HEENT: Without gross lesions.  No cervical or supraclavicular adenopathy. Thyroid normal.  Lungs:  Clear without wheezing, rales or rhonchi Cardiac: RR, without RMG Abdominal:  Soft, nontender, without masses, guarding, rebound, organomegaly or hernia Breasts:  Examined lying and sitting without masses, retractions, discharge or axillary adenopathy. Pelvic:  Ext/BUS/vagina with atrophic changes  Cervix with atrophic changes  Uterus anteverted, normal size, shape and contour, midline and mobile nontender   Adnexa without masses or tenderness    Anus and perineum normal   Rectovaginal normal sphincter tone without palpated masses or tenderness.    Assessment/Plan:  60 y.o. [id number] female for annual exam.   1. Postmenopausal/atrophic genital changes. Continues on Vagifem 10 g twice weekly. Doing well with good symptom relief and wants to continue. I again reviewed the whole issues of absorption and global effects such as thrombosis, breast stimulation and endometrial stimulation. Patient's comfortable continuing. No vaginal bleeding and she knows the need to report any vaginal bleeding. Refill 1 year provided. 2. Osteopenia. DEXA 12/2014 T score -2.1 FRAX 8.6%/1.1%. Plan repeat DEXA next year. Is taking extra vitamin D. Is going to have blood work at her primary  physician's and recommend they check a vitamin D level and she knows to remind them. 3. Mammography 09/2015. Continue with annual mammography when due. SBE monthly reviewed. 4. Colonoscopy coming due next year and patient knows to call and schedule. 5. Pap smear 11/2014. No Pap smear done today. No history of significant abnormal Pap smears previously. 6. Health maintenance. No routine lab work done as patient reports this is going to be done at her primary physician's office who she has an appointment to see. Follow up in one year, sooner as needed.   [name] MD, 11:07 AM 11/17/2015

## 2015-11-18 LAB — URINALYSIS W MICROSCOPIC + REFLEX CULTURE
Bacteria, UA: NONE SEEN [HPF]
Bilirubin Urine: NEGATIVE
CASTS: NONE SEEN [LPF]
CRYSTALS: NONE SEEN [HPF]
Glucose, UA: NEGATIVE
HGB URINE DIPSTICK: NEGATIVE
KETONES UR: NEGATIVE
Leukocytes, UA: NEGATIVE
NITRITE: NEGATIVE
PH: 7.5 (ref 5.0–8.0)
Protein, ur: NEGATIVE
RBC / HPF: NONE SEEN RBC/HPF (ref <=2)
SPECIFIC GRAVITY, URINE: 1.008 (ref 1.001–1.035)
WBC, UA: NONE SEEN WBC/HPF (ref <=5)
YEAST: NONE SEEN [HPF]

## 2016-07-05 ENCOUNTER — Other Ambulatory Visit: Payer: Self-pay

## 2016-07-05 MED ORDER — ESTRADIOL 10 MCG VA TABS: 1.0000 | ORAL_TABLET | VAGINAL | 5 refills | Status: DC

## 2016-08-19 ENCOUNTER — Ambulatory Visit (INDEPENDENT_AMBULATORY_CARE_PROVIDER_SITE_OTHER): Payer: 59 | Admitting: Rheumatology

## 2016-08-19 ENCOUNTER — Encounter: Payer: Self-pay | Admitting: Rheumatology

## 2016-08-19 VITALS — BP 136/85 | HR 76 | Resp 12 | Ht 64.0 in | Wt 134.0 lb

## 2016-08-19 DIAGNOSIS — H3530 Unspecified macular degeneration: Secondary | ICD-10-CM

## 2016-08-19 DIAGNOSIS — M797 Fibromyalgia: Secondary | ICD-10-CM

## 2016-08-19 DIAGNOSIS — M1990 Unspecified osteoarthritis, unspecified site: Secondary | ICD-10-CM | POA: Diagnosis not present

## 2016-08-19 DIAGNOSIS — H209 Unspecified iridocyclitis: Secondary | ICD-10-CM | POA: Diagnosis not present

## 2016-08-19 DIAGNOSIS — G5601 Carpal tunnel syndrome, right upper limb: Secondary | ICD-10-CM

## 2016-08-19 DIAGNOSIS — Z79899 Other long term (current) drug therapy: Secondary | ICD-10-CM | POA: Diagnosis not present

## 2016-08-19 DIAGNOSIS — I10 Essential (primary) hypertension: Secondary | ICD-10-CM

## 2016-08-19 NOTE — Patient Instructions (Signed)
Standing Labs We placed an order today for your standing lab work.    Please come back and get your standing labs in January 2018 and every 3 months.    We have open lab Monday through Friday from 8:30-11:30 AM and 1-4 PM at the office of Dr. Shaili Deveshwar/Naitik Panwala, PA.   The office is located at [address], [address], [place], [place] [address] No appointment is necessary.   Labs are drawn by [facility].  You may receive a bill from [facility] for your lab work.     

## 2016-08-19 NOTE — Progress Notes (Signed)
*IMAGE* Office Visit Note  Patient: Monica [name]             Date of Birth: 1956-08-08           MRN: [id number]             PCP: Robert Bellow, MD Referring: Lemmie Evens, MD Visit Date: 08/19/2016 Occupation:@GUAROCC @    Subjective:  Wrist pain   History of Present Illness: Monica [name] is a 60 y.o. female with history of inflammatory arthritis and iritis. She states that she is doing fairly well. I she has had some discomfort in her left wrist over her flexor tendons. She has had no flare of knee joint arthritis. No flare of iritis. She's been busy taking care of her twin grandchildren who were born recently and are still in the hospital.  Activities of Daily Living:  Patient reports morning stiffness for20 minutes.   Patient Denies nocturnal pain.  Difficulty dressing/grooming: Denies Difficulty climbing stairs: Denies Difficulty getting out of chair: Denies Difficulty using hands for taps, buttons, cutlery, and/or writing: Denies   Review of Systems  Constitutional: Negative for fatigue, night sweats, weight gain, weight loss and weakness.  HENT: Negative for mouth sores, trouble swallowing, trouble swallowing, mouth dryness and nose dryness.   Eyes: Positive for dryness. Negative for pain, redness and visual disturbance.  Respiratory: Negative for cough, shortness of breath and difficulty breathing.   Cardiovascular: Negative for chest pain, palpitations, hypertension, irregular heartbeat and swelling in legs/feet.  Gastrointestinal: Negative for blood in stool, constipation and diarrhea.  Endocrine: Negative for increased urination.  Genitourinary: Negative for vaginal dryness.  Musculoskeletal: Positive for arthralgias, joint pain and morning stiffness. Negative for joint swelling, myalgias, muscle weakness, muscle tenderness and myalgias.  Skin: Negative for color change, rash, hair loss, skin tightness, ulcers and sensitivity to sunlight.   Allergic/Immunologic: Negative for susceptible to infections.  Neurological: Negative for dizziness, memory loss and night sweats.  Hematological: Negative for swollen glands.  Psychiatric/Behavioral: Negative for depressed mood and sleep disturbance. The patient is not nervous/anxious.     PMFS History:  Patient Active Problem List   Diagnosis Date Noted  . Leg weakness 07/08/2014  . Difficulty walking 07/08/2014  . Vaginal dryness   . Reiters syndrome   . Raynaud disease   . Fibromyalgia   . Arthritis   . Osteopenia     Past Medical History:  Diagnosis Date  . Arthritis   . Fibromyalgia   . Macular degeneration    Left eye  . Osteopenia 12/2014   T score -2.1 FRAX 8.6%/1.1%  . Raynaud disease   . Reiters syndrome   . Sleep disturbance   . Vaginal dryness     Family History  Problem Relation Age of Onset  . Hypertension Sister   . Hyperlipidemia Sister   . Heart disease Brother   . Hyperlipidemia Brother   . Alzheimer's disease Mother    Past Surgical History:  Procedure Laterality Date  . BREAST SURGERY     LEFT BR LUMP-Benign   Social History   Social History Narrative  . No narrative on file     Objective: Vital Signs: BP 136/85 (BP Location: Left Arm, Patient Position: Sitting, Cuff Size: Small)   Pulse 76   Resp 12   Ht 5\' 4"  (1.626 m)   Wt 134 lb (60.8 kg)   BMI 23.00 kg/m    Physical Exam  Constitutional: She is oriented to person, place, and time. She  appears well-developed and well-nourished.  HENT:  Head: Normocephalic and atraumatic.  Eyes: Conjunctivae and EOM are normal.  Neck: Normal range of motion.  Cardiovascular: Normal rate, regular rhythm, normal heart sounds and intact distal pulses.   Pulmonary/Chest: Effort normal and breath sounds normal.  Abdominal: Soft. Bowel sounds are normal.  Lymphadenopathy:    She has no cervical adenopathy.  Neurological: She is alert and oriented to person, place, and time.  Skin: Skin is warm  and dry. Capillary refill takes less than 2 seconds.  Psychiatric: She has a normal mood and affect. Her behavior is normal.  Nursing note and vitals reviewed.    Musculoskeletal Exam: She has good range of motion of her C-spine, thoracic, lumbar spine. There was no SI joint tenderness. Her shoulder joints elbow joints wrist joint MCPs PIPs with good range of motion. She bowel thickening of the DIP joints. She has mild tenderness over her left wrist flexor tendons. Her hip joints, knee joints, ankle joints MTPs PIPs with good range of motion with no synovitis. Fibromyalgia tender points all absent.  CDAI Exam: No CDAI exam completed.    Investigation: Findings:  07/20/2016 CBC normal, CMP normal    Imaging: No results found.  Speciality Comments: No specialty comments available.    Procedures:  No procedures performed Allergies: Patient has no known allergies.   Assessment / Plan: Visit Diagnoses: Inflammatory arthritis - she still has mild left wrist joint flexor tenosynovitis. She has missed a few doses of sulfasalazine. She plans to resume her sulfasalazine on regular basis along with Celebrex and we will observe for right now.  High-risk prescription: She is on SSZ,Celebrex. We are monitoring her labs which are stable. Standing orders were given to check labs every 3 months. She will call us when her next prescription is due.  Iritis: She has had no flare of iritis.  Carpal tunnel syndrome of right wrist: The symptoms have improved.  Fibromyalgia: Appears to be in remission.  Essential hypertension: Blood pressure was slightly elevated today have advised her to monitor blood pressure.  Macular degeneration - Left eye      Face-to-face time spent with patient was 30 minutes. 50% of time was spent in counseling and coordination of care.  Follow-Up Instructions: Return in about 6 years (around 08/19/2022) for Autoimmune disease.    Bo Merino, MD, [facility] Payer

## 2016-10-20 ENCOUNTER — Other Ambulatory Visit: Payer: Self-pay | Admitting: Rheumatology

## 2016-10-20 NOTE — Telephone Encounter (Signed)
Last Visit: 08/19/16 Next Visit: 01/17/17 Labs: 07/21/16 WNL  Okay to refill FABB?

## 2016-10-20 NOTE — Telephone Encounter (Signed)
ok 

## 2016-10-23 ENCOUNTER — Other Ambulatory Visit: Payer: Self-pay | Admitting: Rheumatology

## 2016-10-24 ENCOUNTER — Encounter (HOSPITAL_COMMUNITY): Payer: Self-pay | Admitting: Emergency Medicine

## 2016-10-24 ENCOUNTER — Emergency Department (HOSPITAL_COMMUNITY)
Admission: EM | Admit: 2016-10-24 | Discharge: 2016-10-25 | Disposition: A | Discharge status: Home / Self Care | Payer: 59 | Attending: Emergency Medicine | Admitting: Emergency Medicine

## 2016-10-24 DIAGNOSIS — M545 Low back pain: Secondary | ICD-10-CM | POA: Diagnosis not present

## 2016-10-24 DIAGNOSIS — Z79899 Other long term (current) drug therapy: Secondary | ICD-10-CM | POA: Diagnosis not present

## 2016-10-24 MED ORDER — DEXAMETHASONE SODIUM PHOSPHATE 10 MG/ML IJ SOLN
10.0000 mg | Freq: Once | INTRAMUSCULAR | Status: AC
  Administered 2016-10-24: 10 mg via INTRAMUSCULAR
  Filled 2016-10-24: qty 1

## 2016-10-24 MED ORDER — HYDROCODONE-ACETAMINOPHEN 5-325 MG PO TABS: 2.0000 | ORAL_TABLET | ORAL | 0 refills | Status: DC | PRN

## 2016-10-24 MED ORDER — ONDANSETRON 4 MG PO TBDP: 4.0000 mg | ORAL_TABLET | Freq: Three times a day (TID) | ORAL | 0 refills | Status: DC | PRN

## 2016-10-24 MED ORDER — HYDROCODONE-ACETAMINOPHEN 5-325 MG PO TABS
2.0000 | ORAL_TABLET | Freq: Once | ORAL | Status: AC
  Administered 2016-10-24: 2 via ORAL
  Filled 2016-10-24: qty 2

## 2016-10-24 NOTE — Discharge Instructions (Signed)
We have given you a medication this evening, Decadron. This is a shot of steroid that will last in your system for 72 hours and continued to work. We have also given a medication called hydrocodone with acetaminophen, this trade name is Vicodin. This is a strong opiate medication which can help relieve pain however it may have some side effects including nausea, vomiting, constipation and itching. You may take 2 tablets every 6 hours maximum dose.   It is safe to take this medication with your home medications.  If you should develop severe or worsening pain, vomiting, fever, weakness, numbness or difficulty walking, difficulty urinating or any other concerning symptoms, please return to the emergency department immediately.

## 2016-10-24 NOTE — ED Notes (Signed)
ED Provider at bedside. 

## 2016-10-24 NOTE — ED Provider Notes (Signed)
[facility] DEPT Provider Note   CSN: [id number] Arrival date & time: 10/24/16  2154  By signing my name below, I, Jeanell Sparrow, attest that this documentation has been prepared under the direction and in the presence of Noemi Chapel, MD . Electronically Signed: Jeanell Sparrow, Scribe. 10/24/2016. 10:44 PM.  History   Chief Complaint Chief Complaint  Patient presents with  . Back Pain   The history is provided by the patient. No language interpreter was used.   HPI Comments: CASSONDRA KOSCIOLEK is a 61 y.o. female who presents to the Emergency Department complaining of constant moderate lower left back pain that started 3 days ago. She has had prior back pain that was unresolved by physical therapy. She states her pain worsened tonight upon sneezing. Her pain is relieved by standing upright, and it is exacerbated by laying flat and bending over. She reports the pain radiates into her left calf. She is currently on Celebrex for her arthritis. She denies any hx of back surgeries, recent trauma, fever, cough, SOB, numbness/tinling, saddle paresthesias, dysuria, bowel/bladder incontinence, hx of cancer, or other complaints.    PCP: Robert Bellow, MD  Past Medical History:  Diagnosis Date  . Arthritis   . Fibromyalgia   . Macular degeneration    Left eye  . Osteopenia 12/2014   T score -2.1 FRAX 8.6%/1.1%  . Raynaud disease   . Reiters syndrome   . Sleep disturbance   . Vaginal dryness     Patient Active Problem List   Diagnosis Date Noted  . Leg weakness 07/08/2014  . Difficulty walking 07/08/2014  . Vaginal dryness   . Reiters syndrome   . Raynaud disease   . Fibromyalgia   . Arthritis   . Osteopenia     Past Surgical History:  Procedure Laterality Date  . BREAST SURGERY     LEFT BR LUMP-Benign    OB History    Gravida Para Term Preterm AB Living   4 2 2   2 2    SAB TAB Ectopic Multiple Live Births                   Home Medications    Prior to  Admission medications   Medication Sig Start Date End Date Taking? Authorizing Provider  Celecoxib (CELEBREX PO) Take 200 mg by mouth 2 (two) times daily as needed.     Historical Provider, MD  cholecalciferol (VITAMIN D) 1000 UNITS tablet Take 2,000 Units by mouth daily.     Historical Provider, MD  Esomeprazole Magnesium (NEXIUM PO) Take by mouth daily as needed. Reported on 11/17/2015    Historical Provider, MD  Estradiol (VAGIFEM) 10 MCG TABS vaginal tablet Place 1 tablet (10 mcg total) vaginally 2 (two) times a week. 07/05/16   [name], MD  FABB 2.2-25-1 MG TABS TAKE ONE TABLET BY MOUTH ONCE DAILY. 10/20/16   Bo Merino, MD  Folic Acid-Vit [id number] (FOLGARD PO) Take by mouth.      Historical Provider, MD  HYDROcodone-acetaminophen (NORCO/VICODIN) 5-325 MG tablet Take 2 tablets by mouth every 4 (four) hours as needed for moderate pain. 10/24/16   Noemi Chapel, MD  LOSARTAN POTASSIUM PO Take 50 mg by mouth daily.     Historical Provider, MD  ondansetron (ZOFRAN ODT) 4 MG disintegrating tablet Take 1 tablet (4 mg total) by mouth every 8 (eight) hours as needed for nausea. 10/24/16   Noemi Chapel, MD  OVER THE COUNTER MEDICATION EYE VITAMIN  Historical Provider, MD  SulfaSALAzine (SULFAZINE PO) Take 500 mg by mouth 2 (two) times daily.     Historical Provider, MD    Family History Family History  Problem Relation Age of Onset  . Hypertension Sister   . Hyperlipidemia Sister   . Heart disease Brother   . Hyperlipidemia Brother   . Alzheimer's disease Mother     Social History Social History  Substance Use Topics  . Smoking status: Never Smoker  . Smokeless tobacco: Never Used  . Alcohol use No     Allergies   Patient has no known allergies.   Review of Systems Review of Systems  Constitutional: Negative for fever.  Respiratory: Negative for cough and shortness of breath.   Genitourinary: Negative for dysuria.  Musculoskeletal: Positive for back pain (Lower  left).  Neurological: Negative for numbness.  All other systems reviewed and are negative.    Physical Exam Updated Vital Signs BP 150/80 (BP Location: Left Arm)   Pulse 93   Temp 98.1 F (36.7 C) (Oral)   Resp 18   Ht 5\' 4"  (1.626 m)   Wt 136 lb (61.7 kg)   SpO2 99%   BMI 23.34 kg/m   Physical Exam  Constitutional: She appears well-developed and well-nourished. No distress.  HENT:  Head: Normocephalic.  Eyes: Conjunctivae are normal.  Neck: Neck supple.  Cardiovascular: Normal rate and regular rhythm.   Pulmonary/Chest: Effort normal.  Abdominal: Soft.  Musculoskeletal: Normal range of motion.  There is tenderness to palpation over the left sacroiliac joint as well as the left upper buttock, there is no midline tenderness over the cervical thoracic or lumbar spines  Neurological: She is alert.  Normal sensation and strength to the bilateral lower extremities diffusely, normal reflexes at the patellar tendons bilaterally. The reflexes are brisk and equal. The patient has a normal gait. She has normal upper extremity function. She has normal strength at the hips knees and ankles as tested with both flexion and extension and dorsiflexion plantar flexion.  She has normal sensation diffusely over the bilateral lower extremities.  Skin: Skin is warm and dry.  Psychiatric: She has a normal mood and affect.  Nursing note and vitals reviewed.    ED Treatments / Results  DIAGNOSTIC STUDIES: Oxygen Saturation is 99% on RA, normal by my interpretation.    COORDINATION OF CARE: 10:48 PM- Pt advised of plan for treatment and pt agrees.  Labs (all labs ordered are listed, but only abnormal results are displayed) Labs Reviewed - No data to display   Radiology No results found.  Procedures Procedures (including critical care time)  Medications Ordered in ED Medications  dexamethasone (DECADRON) injection 10 mg (10 mg Intramuscular Given 10/24/16 2324)    HYDROcodone-acetaminophen (NORCO/VICODIN) 5-325 MG per tablet 2 tablet (2 tablets Oral Given 10/24/16 2324)     Initial Impression / Assessment and Plan / ED Course  I have reviewed the triage vital signs and the nursing notes.  Pertinent labs & imaging results that were available during my care of the patient were reviewed by me and considered in my medical decision making (see chart for details).  Clinical Course      sciatica given the discomfort in her left calf or this could possibly be related to a sacroiliac inflammatory process. She does have an underlying inflammatory disease, she is artery on anti-inflammatories. The decision was made to give a single dose of steroids and to follow this with some hydrocodone with acetaminophen. The patient  has agreed, she will be given a to go pack of Vicodin as well as a prescription for Zofran in case she becomes nauseated. She expressed her understanding and is agreeable to the plan.  Final Clinical Impressions(s) / ED Diagnoses   Final diagnoses:  Left low back pain, unspecified chronicity, with sciatica presence unspecified    New Prescriptions New Prescriptions   HYDROCODONE-ACETAMINOPHEN (NORCO/VICODIN) 5-325 MG TABLET    Take 2 tablets by mouth every 4 (four) hours as needed for moderate pain.   ONDANSETRON (ZOFRAN ODT) 4 MG DISINTEGRATING TABLET    Take 1 tablet (4 mg total) by mouth every 8 (eight) hours as needed for nausea.   I personally performed the services described in this documentation, which was scribed in my presence. The recorded information has been reviewed and is accurate.        Noemi Chapel, MD 10/24/16 2352

## 2016-10-24 NOTE — ED Triage Notes (Signed)
Pt states she has been having low back pain for 2-3 days with no relief.  Pt states she has had this in the past but not to the extent the pain is tonight

## 2016-10-25 ENCOUNTER — Other Ambulatory Visit (HOSPITAL_COMMUNITY): Payer: Self-pay | Admitting: Family Medicine

## 2016-10-25 ENCOUNTER — Ambulatory Visit (HOSPITAL_COMMUNITY)
Admission: RE | Admit: 2016-10-25 | Discharge: 2016-10-25 | Disposition: A | Discharge status: Home / Self Care | Payer: 59 | Source: Ambulatory Visit | Attending: Family Medicine | Admitting: Family Medicine

## 2016-10-25 DIAGNOSIS — M5440 Lumbago with sciatica, unspecified side: Secondary | ICD-10-CM

## 2016-10-25 DIAGNOSIS — R52 Pain, unspecified: Secondary | ICD-10-CM

## 2016-10-25 DIAGNOSIS — I700 Atherosclerosis of aorta: Secondary | ICD-10-CM

## 2016-10-25 NOTE — Telephone Encounter (Signed)
ok 

## 2016-10-25 NOTE — Telephone Encounter (Signed)
Last Visit: 08/19/16 Next Visit: 01/17/17 Labs: 07/21/16 WNL  Okay to refill SSZ?

## 2016-10-26 ENCOUNTER — Emergency Department (HOSPITAL_COMMUNITY)
Admission: EM | Admit: 2016-10-26 | Discharge: 2016-10-26 | Disposition: A | Discharge status: Home / Self Care | Payer: 59 | Attending: Emergency Medicine | Admitting: Emergency Medicine

## 2016-10-26 ENCOUNTER — Encounter (HOSPITAL_COMMUNITY): Payer: Self-pay | Admitting: Emergency Medicine

## 2016-10-26 ENCOUNTER — Emergency Department (HOSPITAL_COMMUNITY): Payer: 59

## 2016-10-26 DIAGNOSIS — M5442 Lumbago with sciatica, left side: Secondary | ICD-10-CM | POA: Insufficient documentation

## 2016-10-26 DIAGNOSIS — M545 Low back pain: Secondary | ICD-10-CM | POA: Diagnosis present

## 2016-10-26 DIAGNOSIS — M549 Dorsalgia, unspecified: Secondary | ICD-10-CM

## 2016-10-26 DIAGNOSIS — Z79899 Other long term (current) drug therapy: Secondary | ICD-10-CM | POA: Insufficient documentation

## 2016-10-26 LAB — CBC WITH DIFFERENTIAL/PLATELET
BASOS PCT: 0 %
Basophils Absolute: 0 10*3/uL (ref 0.0–0.1)
Eosinophils Absolute: 0 10*3/uL (ref 0.0–0.7)
Eosinophils Relative: 0 %
HEMATOCRIT: 40.1 % (ref 36.0–46.0)
Hemoglobin: 13 g/dL (ref 12.0–15.0)
Lymphocytes Relative: 22 %
Lymphs Abs: 1.8 10*3/uL (ref 0.7–4.0)
MCH: 29.8 pg (ref 26.0–34.0)
MCHC: 32.4 g/dL (ref 30.0–36.0)
MCV: 92 fL (ref 78.0–100.0)
MONO ABS: 0.7 10*3/uL (ref 0.1–1.0)
MONOS PCT: 8 %
NEUTROS ABS: 6 10*3/uL (ref 1.7–7.7)
Neutrophils Relative %: 70 %
Platelets: 307 10*3/uL (ref 150–400)
RBC: 4.36 MIL/uL (ref 3.87–5.11)
RDW: 13.7 % (ref 11.5–15.5)
WBC: 8.5 10*3/uL (ref 4.0–10.5)

## 2016-10-26 LAB — BASIC METABOLIC PANEL
Anion gap: 5 (ref 5–15)
BUN: 8 mg/dL (ref 6–20)
CALCIUM: 9 mg/dL (ref 8.9–10.3)
CO2: 30 mmol/L (ref 22–32)
CREATININE: 0.81 mg/dL (ref 0.44–1.00)
Chloride: 105 mmol/L (ref 101–111)
GFR calc non Af Amer: >60 mL/min (ref >60)
GLUCOSE: 110 mg/dL — AB (ref 65–99)
Potassium: 3.6 mmol/L (ref 3.5–5.1)
Sodium: 140 mmol/L (ref 135–145)

## 2016-10-26 LAB — URINALYSIS, ROUTINE W REFLEX MICROSCOPIC
Bilirubin Urine: NEGATIVE
GLUCOSE, UA: NEGATIVE mg/dL
HGB URINE DIPSTICK: NEGATIVE
KETONES UR: NEGATIVE mg/dL
Leukocytes, UA: NEGATIVE
Nitrite: NEGATIVE
PH: 5.5 (ref 5.0–8.0)
Protein, ur: NEGATIVE mg/dL
Specific Gravity, Urine: 1.01 (ref 1.005–1.030)

## 2016-10-26 MED ORDER — PREDNISONE 10 MG PO TABS: ORAL_TABLET | ORAL | 0 refills | Status: DC

## 2016-10-26 MED ORDER — FENTANYL CITRATE (PF) 100 MCG/2ML IJ SOLN
50.0000 ug | Freq: Once | INTRAMUSCULAR | Status: AC
  Administered 2016-10-26: 50 ug via INTRAVENOUS
  Filled 2016-10-26: qty 2

## 2016-10-26 MED ORDER — KETOROLAC TROMETHAMINE 60 MG/2ML IM SOLN
60.0000 mg | Freq: Once | INTRAMUSCULAR | Status: AC
  Administered 2016-10-26: 60 mg via INTRAMUSCULAR
  Filled 2016-10-26: qty 2

## 2016-10-26 MED ORDER — DIAZEPAM 5 MG PO TABS
5.0000 mg | ORAL_TABLET | Freq: Once | ORAL | Status: AC
Start: 2016-10-26 — End: 2016-10-26
  Administered 2016-10-26: 5 mg via ORAL
  Filled 2016-10-26: qty 1

## 2016-10-26 MED ORDER — METHOCARBAMOL 500 MG PO TABS: 500.0000 mg | ORAL_TABLET | Freq: Three times a day (TID) | ORAL | 0 refills | Status: DC

## 2016-10-26 MED FILL — Hydrocodone-Acetaminophen Tab 5-325 MG: ORAL | Qty: 6 | Status: AC

## 2016-10-26 NOTE — ED Triage Notes (Addendum)
Pt reports back pain starting Sunday, was seen here and saw Dr.Nolton yesterday and had x-ray on back due to L foot swelling and tingling.  Pt denies injury.

## 2016-10-26 NOTE — Discharge Instructions (Signed)
Alternate ice and heat to your back.  Follow-up with your primary doctor for recheck or contact the neurosurgery office listed.

## 2016-10-26 NOTE — ED Notes (Signed)
ED Provider at bedside. 

## 2016-10-29 NOTE — ED Provider Notes (Signed)
[facility] DEPT Provider Note   CSN: [id number] Arrival date & time: 10/26/16  0856     History   Chief Complaint Chief Complaint  Patient presents with  . Foot Swelling    HPI MAHNOOR FITZNER is a 61 y.o. female.  HPI   KEYALA CHIZMAR is a 61 y.o. female who presents to the Emergency Department complaining of swelling of her bilateral feet and low back pain.  She was seen here on 10/24/16 and treated for back pain.  She was also seen by her PMD and sent here for an XR of her back and treated with Rx for pain medications.  Pain has not been controlled. She describes an constant pain to her left lower back that radiates down the left leg and to her left foot.  States her foot appears swollen.  Pain is worse with weight bearing.  She denies known injury but states pain began after a sneeze.  She denies numbness of her LE's, abd pain, fever, urine or bowel incontinence.   Past Medical History:  Diagnosis Date  . Arthritis   . Fibromyalgia   . Macular degeneration    Left eye  . Osteopenia 12/2014   T score -2.1 FRAX 8.6%/1.1%  . Raynaud disease   . Reiters syndrome   . Sleep disturbance   . Vaginal dryness     Patient Active Problem List   Diagnosis Date Noted  . Leg weakness 07/08/2014  . Difficulty walking 07/08/2014  . Vaginal dryness   . Reiters syndrome   . Raynaud disease   . Fibromyalgia   . Arthritis   . Osteopenia     Past Surgical History:  Procedure Laterality Date  . BREAST SURGERY     LEFT BR LUMP-Benign    OB History    Gravida Para Term Preterm AB Living   4 2 2   2 2    SAB TAB Ectopic Multiple Live Births                   Home Medications    Prior to Admission medications   Medication Sig Start Date End Date Taking? Authorizing Provider  acetaminophen (TYLENOL) 500 MG tablet Take 500 mg by mouth as needed for moderate pain.   Yes Historical Provider, MD  Celecoxib (CELEBREX PO) Take 200 mg by mouth 2 (two) times daily as needed.     Yes Historical Provider, MD  cholecalciferol (VITAMIN D) 1000 UNITS tablet Take 2,000 Units by mouth daily.    Yes Historical Provider, MD  diclofenac sodium (VOLTAREN) 1 % GEL Apply 1 application topically 2 (two) times daily as needed (Pain).   Yes Historical Provider, MD  Estradiol (VAGIFEM) 10 MCG TABS vaginal tablet Place 1 tablet (10 mcg total) vaginally 2 (two) times a week. 07/05/16  Yes [name], MD  FABB 2.2-25-1 MG TABS TAKE ONE TABLET BY MOUTH ONCE DAILY. 10/20/16  Yes Bo Merino, MD  HYDROcodone-acetaminophen (NORCO/VICODIN) 5-325 MG tablet Take 2 tablets by mouth every 4 (four) hours as needed for moderate pain. 10/24/16  Yes Noemi Chapel, MD  Lifitegrast Shirley Friar) 5 % SOLN Apply 1 drop to eye as needed (Dry eye).   Yes Historical Provider, MD  LOSARTAN POTASSIUM PO Take 50 mg by mouth daily.    Yes Historical Provider, MD  OVER THE COUNTER MEDICATION EYE VITAMIN   Yes Historical Provider, MD  sulfaSALAzine (AZULFIDINE) 500 MG EC tablet TAKE (2) TABLETS BY MOUTH TWICE DAILY. 10/25/16  Yes  Bo Merino, MD  Esomeprazole Magnesium (NEXIUM PO) Take by mouth as needed (Heartburn). Reported on 11/17/2015    Historical Provider, MD  methocarbamol (ROBAXIN) 500 MG tablet Take 1 tablet (500 mg total) by mouth 3 (three) times daily. 10/26/16   Blain Hunsucker, PA-C  ondansetron (ZOFRAN ODT) 4 MG disintegrating tablet Take 1 tablet (4 mg total) by mouth every 8 (eight) hours as needed for nausea. Patient not taking: Reported on 10/26/2016 10/24/16   Noemi Chapel, MD  predniSONE (DELTASONE) 10 MG tablet Take 6 tablets day one, 5 tablets day two, 4 tablets day three, 3 tablets day four, 2 tablets day five, then 1 tablet day six 10/26/16   Kem Parkinson, PA-C    Family History Family History  Problem Relation Age of Onset  . Hypertension Sister   . Hyperlipidemia Sister   . Heart disease Brother   . Hyperlipidemia Brother   . Alzheimer's disease Mother     Social  History Social History  Substance Use Topics  . Smoking status: Never Smoker  . Smokeless tobacco: Never Used  . Alcohol use No     Allergies   Patient has no known allergies.   Review of Systems Review of Systems  Constitutional: Negative for fever.  Respiratory: Negative for shortness of breath.   Gastrointestinal: Negative for abdominal pain, constipation and vomiting.  Genitourinary: Negative for decreased urine volume, difficulty urinating, dysuria, flank pain and hematuria.  Musculoskeletal: Positive for back pain. Negative for joint swelling.  Skin: Negative for rash.  Neurological: Negative for weakness and numbness.  All other systems reviewed and are negative.    Physical Exam Updated Vital Signs BP 138/80 (BP Location: Left Arm)   Pulse 72   Temp 98.3 F (36.8 C) (Oral)   Resp 18   Ht 5\' 4"  (1.626 m)   Wt 61.7 kg   SpO2 95%   BMI 23.34 kg/m   Physical Exam  Constitutional: She is oriented to person, place, and time. She appears well-developed and well-nourished. No distress.  HENT:  Head: Normocephalic and atraumatic.  Neck: Normal range of motion. Neck supple.  Cardiovascular: Normal rate, regular rhythm, normal heart sounds and intact distal pulses.   No murmur heard. Pulmonary/Chest: Effort normal and breath sounds normal. No respiratory distress.  Abdominal: Soft. She exhibits no distension. There is no tenderness.  Musculoskeletal: She exhibits tenderness. She exhibits no edema.       Lumbar back: She exhibits tenderness and pain. She exhibits normal range of motion, no swelling, no deformity, no laceration and normal pulse.  ttp of the left lumbar paraspinal muscles.  No spinal tenderness.  DP pulses are brisk and symmetrical.  Distal sensation intact.  Pt has 3/5 strength against resistance of left lower extremity.     Neurological: She is alert and oriented to person, place, and time. She has normal strength. No sensory deficit. She exhibits  normal muscle tone. Coordination and gait normal.  Reflex Scores:      Patellar reflexes are 2+ on the right side and 2+ on the left side.      Achilles reflexes are 2+ on the right side and 2+ on the left side. Skin: Skin is warm and dry. No rash noted.  Nursing note and vitals reviewed.    ED Treatments / Results  Labs (all labs ordered are listed, but only abnormal results are displayed) Labs Reviewed  BASIC METABOLIC PANEL - Abnormal; Notable for the following:       Result  Value   Glucose, Bld 110 (*)    All other components within normal limits  URINALYSIS, ROUTINE W REFLEX MICROSCOPIC  CBC WITH DIFFERENTIAL/PLATELET    EKG  EKG Interpretation None       Radiology Dg Lumbar Spine Complete  Result Date: 10/25/2016 CLINICAL DATA:  Low back and left hip pain. EXAM: LUMBAR SPINE - COMPLETE 4+ VIEW COMPARISON:  None. FINDINGS: There is no evidence of lumbar spine fracture. Alignment is normal. Intervertebral disc spaces are maintained. Aortic atherosclerosis. IMPRESSION: Negative lumbar spine. Aortic atherosclerosis. Electronically Signed   By: Lorriane Shire M.D.   On: 10/25/2016 15:40   Mr Lumbar Spine Wo Contrast  Result Date: 10/26/2016 CLINICAL DATA:  Low back pain radiating to the left leg for 5 days EXAM: MRI LUMBAR SPINE WITHOUT CONTRAST TECHNIQUE: Multiplanar, multisequence MR imaging of the lumbar spine was performed. No intravenous contrast was administered. COMPARISON:  None. FINDINGS: Segmentation:  Standard. Alignment:  Physiologic. Vertebrae: No fracture, evidence of discitis, or aggressive bone lesion. 14 mm T2 hyperintense bone lesion in the L5 vertebral body with stippled low signal within the lesion likely reflecting an atypical hemangioma. Conus medullaris: Extends to the L1 level and appears normal. Paraspinal and other soft tissues: No paraspinal abnormality. Disc levels: Disc spaces: Degenerative disc disease with disc desiccation at L4-5 and L5-S1. T12-L1:  No significant disc bulge. No evidence of neural foraminal stenosis. No central canal stenosis. L1-L2: Minimal broad-based disc bulge. No evidence of neural foraminal stenosis. No central canal stenosis. L2-L3: No significant disc bulge. No evidence of neural foraminal stenosis. No central canal stenosis. L3-L4: No significant disc bulge. No evidence of neural foraminal stenosis. No central canal stenosis. L4-L5: Mild broad-based disc bulge with a a right foraminal shallow disc protrusion. No evidence of neural foraminal stenosis. No central canal stenosis. L5-S1: Broad-based disc bulge with a small left foraminal/lateral disc protrusion abutting the left extraforaminal L5 nerve root. Mild bilateral facet arthropathy. No evidence of neural foraminal stenosis. No central canal stenosis. IMPRESSION: 1. At L5-S1 there is a broad-based disc bulge with a small left foraminal/lateral disc protrusion abutting the left extraforaminal L5 nerve root. 2. At L4-5 there is a mild broad-based disc bulge with a a right foraminal shallow disc protrusion. Electronically Signed   By: Kathreen Devoid   On: 10/26/2016 11:03     Procedures Procedures (including critical care time)  Medications Ordered in ED Medications  ketorolac (TORADOL) injection 60 mg (60 mg Intramuscular Given 10/26/16 0950)  diazepam (VALIUM) tablet 5 mg (5 mg Oral Given 10/26/16 0950)  fentaNYL (SUBLIMAZE) injection 50 mcg (50 mcg Intravenous Given 10/26/16 1129)     Initial Impression / Assessment and Plan / ED Course  I have reviewed the triage vital signs and the nursing notes.  Pertinent labs & imaging results that were available during my care of the patient were reviewed by me and considered in my medical decision making (see chart for details).     Pt is feeling better after medications.  MRI results discussed.  No concerning sx's for cauda equina.  No foot drop.  Pt appears stable for d/c, agrees to PMD f/u and referral info given. Return  precautions given  Final Clinical Impressions(s) / ED Diagnoses   Final diagnoses:  Back pain  Acute left-sided low back pain with left-sided sciatica    New Prescriptions Discharge Medication List as of 10/26/2016 12:24 PM    START taking these medications   Details  methocarbamol (ROBAXIN) 500  MG tablet Take 1 tablet (500 mg total) by mouth 3 (three) times daily., Starting Tue 10/26/2016, Print    predniSONE (DELTASONE) 10 MG tablet Take 6 tablets day one, 5 tablets day two, 4 tablets day three, 3 tablets day four, 2 tablets day five, then 1 tablet day six, Print         Kem Parkinson, PA-C 10/30/16 0014    Fredia Sorrow, MD 11/02/16 1751

## 2016-11-08 ENCOUNTER — Encounter: Payer: Self-pay | Admitting: Rheumatology

## 2016-11-09 ENCOUNTER — Other Ambulatory Visit: Payer: Self-pay | Admitting: Radiology

## 2016-11-09 DIAGNOSIS — M545 Low back pain, unspecified: Secondary | ICD-10-CM

## 2016-11-09 NOTE — Telephone Encounter (Signed)
Please refer to Dr. Ernestina Patches

## 2016-11-17 ENCOUNTER — Encounter (INDEPENDENT_AMBULATORY_CARE_PROVIDER_SITE_OTHER): Payer: Self-pay | Admitting: Physical Medicine and Rehabilitation

## 2016-11-17 ENCOUNTER — Ambulatory Visit (INDEPENDENT_AMBULATORY_CARE_PROVIDER_SITE_OTHER): Payer: 59 | Admitting: Physical Medicine and Rehabilitation

## 2016-11-17 VITALS — BP 139/80 | HR 98

## 2016-11-17 DIAGNOSIS — M5416 Radiculopathy, lumbar region: Secondary | ICD-10-CM

## 2016-11-17 DIAGNOSIS — M5441 Lumbago with sciatica, right side: Secondary | ICD-10-CM | POA: Diagnosis not present

## 2016-11-17 DIAGNOSIS — G8929 Other chronic pain: Secondary | ICD-10-CM | POA: Diagnosis not present

## 2016-11-17 NOTE — Progress Notes (Signed)
TAKOYA VERT - 61 y.o. female MRN [id number]  Date of birth: Feb 09, 1956  Office Visit Note: Visit Date: 11/17/2016 PCP: Robert Bellow, MD Referred by: Lemmie Evens, MD  Subjective: Chief Complaint  Patient presents with  . Lower Back - Pain   HPI: Mrs. Radcliff is a very pleasant 61 year old female with history of fibromyalgia, osteoarthritis, Raynaud's syndrome and history of Reiter syndrome who is followed in rheumatology by Dr. Patrecia Pour. She reports pretty severe low back pain and left-sided in the buttock region which started in the lower back pain in early January 13. She reports gradual worsening over a couple of days. She then reports a sneeze and began having severe pain. This pain radiated down the leg with paresthesias and severe pain. She went to the emergency room on the 14th and received an intramuscular shot of steroid medication prednisone Dosepak.. This seems to ameliorate things a little bit but then she found herself in the emergency room on the 19th with Left foot began swelling and radiating down left leg through outer calf and across toes. This is somewhat of a mixed L5 and S1 distribution. The pain and tingling are worse at night.   She was given another round of prednisone by her primary care physician at that point. She has not noted focal weakness but is felt weak in the legs. She's had no fevers chills night sweats or night pain. She has noted no other concerning red flag symptoms. She's had no unexplained recent weight loss. She's had no prior back surgery. MRI was performed at the emergency department. This is reviewed below and shown with the patient. She notify Dr. Patrecia Pour who referred her to our office. Due to the severity of the symptoms she has not had specific physical therapy or chiropractic care. Her pain is quite miserable at this point. She reports that maybe is a little bit better than when it started but is still severe. She takes 2 Celebrex per day  anyway for her rheumatologic conditions and she continues to take this. She does take Tylenol as well. She was given hydrocodone but this does not help much at all she is taking it very sparingly.     Review of Systems  Constitutional: Negative for chills, fever, malaise/fatigue and weight loss.  HENT: Negative for hearing loss and sinus pain.   Eyes: Negative for blurred vision, double vision and photophobia.  Respiratory: Negative for cough and shortness of breath.   Cardiovascular: Negative for chest pain, palpitations and leg swelling.  Gastrointestinal: Negative for abdominal pain, nausea and vomiting.  Genitourinary: Negative for flank pain.  Musculoskeletal: Positive for back pain. Negative for myalgias.  Skin: Negative for itching and rash.  Neurological: Positive for tingling and weakness. Negative for tremors and focal weakness.  Endo/Heme/Allergies: Negative.   Psychiatric/Behavioral: Negative for depression.  All other systems reviewed and are negative.  Otherwise per HPI.  Assessment & Plan: Visit Diagnoses:  1. Lumbar radiculopathy   2. Chronic bilateral low back pain with right-sided sciatica     Plan: Findings:  Chronic history of low back pain and fibromyalgia as well as rheumatologic conditions seen by rheumatology and followed now with acute onset of severe right radicular leg pain with mild weakness in an L5 dermatome. Her symptoms are little bit mixed and do seem to have some S1 dermatome pattern. The disc herniation is lateral and foraminal at L5-S1 and it is small and there is no effective compression but it is right in  that spot and could give her some pain. It is not completely lateral and is somewhat foraminal so I do think this could irritate the S1 nerve root as well. She also has a right foraminal shallow disc protrusion at L4-5 which is asymptomatic. We did show the patient her MRI and overall she actually has a fairly good spine and I want her to leave here  today knowing that her spine is not horrible and that I think she'll do well with this. These disc herniations with nerve root irritation 90% of the time recovering do well we just can't tell her when that happened. I think given the amount of pain that she is having it would be worthwhile for a diagnostic and therapeutic left L5 and S1 transforaminal epidural steroid injection. Once she is having less pain we would regroup with a physical therapist for core strengthening and neutral spine type work and possibly Noah Delaine type of evaluation and management through therapy. This would be not a situation of chronic long-term narcotics. She states is not really helping her at this point anyway. We will see if we can get the injection done and we'll see her back in follow-up at that point. She'll continue to follow with Dr. Patrecia Pour for her rheumatologic conditions. I spent more than 30 minutes speaking face-to-face with the patient with 50% of the time in counseling.    Meds & Orders: No orders of the defined types were placed in this encounter.  No orders of the defined types were placed in this encounter.   Follow-up: Return for Left L5 and S1 transforaminal epidural steroid injection..   Procedures: No procedures performed  No notes on file   Clinical History: Lspine MRI 10/26/2016 L4-L5: Mild broad-based disc bulge with a a right foraminal shallow disc protrusion. No evidence of neural foraminal stenosis. No central canal stenosis.   L5-S1: Broad-based disc bulge with a small left foraminal/lateral disc protrusion abutting the left extraforaminal L5 nerve root. Mild bilateral facet arthropathy. No evidence of neural foraminal stenosis. No central canal stenosis.   IMPRESSION: 1. At L5-S1 there is a broad-based disc bulge with a small left foraminal/lateral disc protrusion abutting the left extraforaminal L5 nerve root. 2. At L4-5 there is a mild broad-based disc bulge with a a  right foraminal shallow disc protrusion.  She reports that she has never smoked. She has never used smokeless tobacco. No results for input(s): HGBA1C, LABURIC in the last 8760 hours.  Objective:  VS:  HT:    WT:   BMI:     BP:139/80  HR:98bpm  TEMP: ( )  RESP:  Physical Exam  Constitutional: She is oriented to person, place, and time. She appears well-developed and well-nourished. No distress.  HENT:  Head: Normocephalic and atraumatic.  Nose: Nose normal.  Mouth/Throat: Oropharynx is clear and moist.  Eyes: Conjunctivae are normal. Pupils are equal, round, and reactive to light.  Neck: Normal range of motion. Neck supple.  Cardiovascular: Regular rhythm and intact distal pulses.   Pulmonary/Chest: Effort normal and breath sounds normal.  Abdominal: Soft. She exhibits no distension.  Musculoskeletal:  Patient ambulates without aid with an antalgic gait to the right. She has a positive slump test to the right. She has good distal strength without deficit. She may have a very mild 4+ out of 5 with EHL on the right. She has no clonus bilaterally. No pain with hip rotation. She is tender over the greater trochanters.  Neurological: She is alert and  oriented to person, place, and time. She exhibits abnormal muscle tone. Coordination abnormal.  Skin: Skin is warm. No rash noted. No erythema.  Psychiatric: She has a normal mood and affect. Her behavior is normal.  Nursing note and vitals reviewed.   Ortho Exam Imaging: No results found.  Past Medical/Family/Surgical/Social History: Medications & Allergies reviewed per EMR Patient Active Problem List   Diagnosis Date Noted  . Leg weakness 07/08/2014  . Difficulty walking 07/08/2014  . Vaginal dryness   . Reiters syndrome   . Raynaud disease   . Fibromyalgia   . Arthritis   . Osteopenia    Past Medical History:  Diagnosis Date  . Arthritis   . Fibromyalgia   . Macular degeneration    Left eye  . Osteopenia 12/2014   T  score -2.1 FRAX 8.6%/1.1%  . Raynaud disease   . Reiters syndrome   . Sleep disturbance   . Vaginal dryness    Family History  Problem Relation Age of Onset  . Hypertension Sister   . Hyperlipidemia Sister   . Heart disease Brother   . Hyperlipidemia Brother   . Alzheimer's disease Mother    Past Surgical History:  Procedure Laterality Date  . BREAST SURGERY     LEFT BR LUMP-Benign   Social History   Occupational History  . Not on file.   Social History Main Topics  . Smoking status: Never Smoker  . Smokeless tobacco: Never Used  . Alcohol use No  . Drug use: No  . Sexual activity: Yes    Birth control/ protection: Post-menopausal     Comment: 1st intercourse 61 yo- Fewer than 5 partners

## 2016-11-18 ENCOUNTER — Encounter: Payer: 59 | Admitting: Gynecology

## 2016-11-22 ENCOUNTER — Ambulatory Visit (INDEPENDENT_AMBULATORY_CARE_PROVIDER_SITE_OTHER): Payer: 59 | Admitting: Physical Medicine and Rehabilitation

## 2016-11-22 ENCOUNTER — Encounter (INDEPENDENT_AMBULATORY_CARE_PROVIDER_SITE_OTHER): Payer: Self-pay | Admitting: Physical Medicine and Rehabilitation

## 2016-11-22 ENCOUNTER — Ambulatory Visit (INDEPENDENT_AMBULATORY_CARE_PROVIDER_SITE_OTHER): Payer: Self-pay

## 2016-11-22 VITALS — BP 146/97 | HR 79

## 2016-11-22 DIAGNOSIS — M5416 Radiculopathy, lumbar region: Secondary | ICD-10-CM

## 2016-11-22 MED ORDER — LIDOCAINE HCL (PF) 1 % IJ SOLN: 0.3300 mL | Freq: Once | INTRAMUSCULAR | Status: DC

## 2016-11-22 MED ORDER — METHYLPREDNISOLONE ACETATE 80 MG/ML IJ SUSP: 80.0000 mg | Freq: Once | INTRAMUSCULAR | Status: DC

## 2016-11-22 NOTE — Patient Instructions (Signed)

## 2016-11-22 NOTE — Progress Notes (Signed)
Monica [name] - 61 y.o. female MRN [id number]  Date of birth: 14-Feb-1956  Office Visit Note: Visit Date: 11/22/2016 PCP: Robert Bellow, MD Referred by: Lemmie Evens, MD  Subjective: Chief Complaint  Patient presents with  . Lower Back - Pain   HPI: Mrs. reveals a 61 year old female who is here today for planned left L5 and S1 transforaminal epidural steroid injection. No change in symptoms. She is having left radicular pain in L5 and S1 distribution. She has MRI evidence of small foraminal and lateral disc protrusion at L5-S1 on the left. Her case is complicated by fibromyalgia.    ROS Otherwise per HPI.  Assessment & Plan: Visit Diagnoses:  1. Lumbar radiculopathy     Plan: Findings:  Left L5 and S1 transforaminal epidural steroid injection.    Meds & Orders:  Meds ordered this encounter  Medications  . lidocaine (PF) (XYLOCAINE) 1 % injection 0.3 mL  . methylPREDNISolone acetate (DEPO-MEDROL) injection 80 mg    Orders Placed This Encounter  Procedures  . XR C-ARM NO REPORT  . Ambulatory referral to Physical Therapy  . Epidural Steroid injection    Follow-up: Return in about 2 weeks (around 12/06/2016).   Procedures: No procedures performed  No notes on file   Clinical History: Lspine MRI 10/26/2016 L4-L5: Mild broad-based disc bulge with a a right foraminal shallow disc protrusion. No evidence of neural foraminal stenosis. No central canal stenosis.   L5-S1: Broad-based disc bulge with a small left foraminal/lateral disc protrusion abutting the left extraforaminal L5 nerve root. Mild bilateral facet arthropathy. No evidence of neural foraminal stenosis. No central canal stenosis.   IMPRESSION: 1. At L5-S1 there is a broad-based disc bulge with a small left foraminal/lateral disc protrusion abutting the left extraforaminal L5 nerve root. 2. At L4-5 there is a mild broad-based disc bulge with a a right foraminal shallow disc protrusion.  She  reports that she has never smoked. She has never used smokeless tobacco. No results for input(s): HGBA1C, LABURIC in the last 8760 hours.  Objective:  VS:  HT:    WT:   BMI:     BP:(!) 146/97  HR:79bpm  TEMP: ( )  RESP:98 % Physical Exam  Musculoskeletal:  She ambulates without aid with good distal strength.    Ortho Exam Imaging: Xr C-arm No Report  Result Date: 11/22/2016 Please see Notes or Procedures tab for imaging impression.   Past Medical/Family/Surgical/Social History: Medications & Allergies reviewed per EMR Patient Active Problem List   Diagnosis Date Noted  . Leg weakness 07/08/2014  . Difficulty walking 07/08/2014  . Vaginal dryness   . Reiters syndrome   . Raynaud disease   . Fibromyalgia   . Arthritis   . Osteopenia    Past Medical History:  Diagnosis Date  . Arthritis   . Fibromyalgia   . Macular degeneration    Left eye  . Osteopenia 12/2014   T score -2.1 FRAX 8.6%/1.1%  . Raynaud disease   . Reiters syndrome   . Sleep disturbance   . Vaginal dryness    Family History  Problem Relation Age of Onset  . Hypertension Sister   . Hyperlipidemia Sister   . Heart disease Brother   . Hyperlipidemia Brother   . Alzheimer's disease Mother    Past Surgical History:  Procedure Laterality Date  . BREAST SURGERY     LEFT BR LUMP-Benign   Social History   Occupational History  . Not on file.  Social History Main Topics  . Smoking status: Never Smoker  . Smokeless tobacco: Never Used  . Alcohol use No  . Drug use: No  . Sexual activity: Yes    Birth control/ protection: Post-menopausal     Comment: 1st intercourse 61 yo- Fewer than 5 partners

## 2016-12-08 ENCOUNTER — Ambulatory Visit (INDEPENDENT_AMBULATORY_CARE_PROVIDER_SITE_OTHER): Payer: 59 | Admitting: Physical Medicine and Rehabilitation

## 2016-12-13 ENCOUNTER — Encounter: Payer: 59 | Admitting: Gynecology

## 2016-12-14 ENCOUNTER — Encounter (INDEPENDENT_AMBULATORY_CARE_PROVIDER_SITE_OTHER): Payer: Self-pay | Admitting: Physical Medicine and Rehabilitation

## 2016-12-14 ENCOUNTER — Ambulatory Visit (INDEPENDENT_AMBULATORY_CARE_PROVIDER_SITE_OTHER): Payer: 59 | Admitting: Physical Medicine and Rehabilitation

## 2016-12-14 DIAGNOSIS — M5116 Intervertebral disc disorders with radiculopathy, lumbar region: Secondary | ICD-10-CM | POA: Diagnosis not present

## 2016-12-14 NOTE — Progress Notes (Signed)
Monica [name] - 61 y.o. female MRN [id number]  Date of birth: 05/14/1956  Office Visit Note: Visit Date: 12/14/2016 PCP: Robert Bellow, MD Referred by: Lemmie Evens, MD  Subjective: Chief Complaint  Patient presents with  . Lower Back - Pain   HPI: Monica [name] is a very pleasant 61 year old active female who comes in today after L5 and S1 transforaminal epidural steroid injection for her left-sided radiculitis radiculopathy from disc herniation. She states that she had relief with last injection. She had Artie started to get somewhat better before the injection but the injection seems to have gotten her to the point where she is actually fairly good at this point and not really hurting to the point that she won't have anything done.[facility] PT next week. Having cramps in calf and toes on left side. She has noticed more of this cramping more frequently. She is otherwise doing well and is in no focal weakness no bowel or bladder difficulty or other changes. She continues to some anti-inflammatory medications and again starting physical therapy.    Review of Systems  Constitutional: Negative for chills, fever, malaise/fatigue and weight loss.  HENT: Negative for hearing loss and sinus pain.   Eyes: Negative for blurred vision, double vision and photophobia.  Respiratory: Negative for cough and shortness of breath.   Cardiovascular: Negative for chest pain, palpitations and leg swelling.  Gastrointestinal: Negative for abdominal pain, nausea and vomiting.  Genitourinary: Negative for flank pain.  Musculoskeletal: Positive for back pain. Negative for myalgias.  Skin: Negative for itching and rash.  Neurological: Negative for tremors, focal weakness and weakness.  Endo/Heme/Allergies: Negative.   Psychiatric/Behavioral: Negative for depression.  All other systems reviewed and are negative.  Otherwise per HPI.  Assessment & Plan: Visit Diagnoses:  1. Radiculopathy due to lumbar  intervertebral disc disorder     Plan: Findings:  Significant history of left-sided foraminal and lateral disc protrusion abutting the left L5 nerve root with some irritation of the S1 nerve root clinically. She does have some arthritis at this level as well. Initial pain was quite severe with paresthesia that was starting to resolve a little bit on its own in an epidural injection now has cut her to the point where she feels like she is doing okay. Her case is complicated by rheumatologic disease as well as fibromyalgia. She is to start physical therapy for core strengthening and exercises. She is motivated to start that and get going. We had a long discussion about the natural history of disc herniations and arthritic back pain as well as physical therapy. I would not change any medications at this point. The symptoms start to return she doesn't have to wait until they're severe I would go ahead and complete a second injection at that point. I spent more than 15 minutes speaking face-to-face with the patient with 50% of the time in counseling.    Meds & Orders: No orders of the defined types were placed in this encounter.  No orders of the defined types were placed in this encounter.   Follow-up: Return if symptoms worsen or fail to improve.   Procedures: No procedures performed  No notes on file   Clinical History: Lspine MRI 10/26/2016 L4-L5: Mild broad-based disc bulge with a a right foraminal shallow disc protrusion. No evidence of neural foraminal stenosis. No central canal stenosis.   L5-S1: Broad-based disc bulge with a small left foraminal/lateral disc protrusion abutting the left extraforaminal L5 nerve root. Mild  bilateral facet arthropathy. No evidence of neural foraminal stenosis. No central canal stenosis.   IMPRESSION: 1. At L5-S1 there is a broad-based disc bulge with a small left foraminal/lateral disc protrusion abutting the left extraforaminal L5 nerve root. 2. At  L4-5 there is a mild broad-based disc bulge with a a right foraminal shallow disc protrusion.  She reports that she has never smoked. She has never used smokeless tobacco. No results for input(s): HGBA1C, LABURIC in the last 8760 hours.  Objective:  VS:  HT:    WT:   BMI:     BP:   HR: bpm  TEMP: ( )  RESP:  Physical Exam  Constitutional: She is oriented to person, place, and time. She appears well-developed and well-nourished.  Eyes: Conjunctivae and EOM are normal. Pupils are equal, round, and reactive to light.  Cardiovascular: Normal rate and intact distal pulses.   Pulmonary/Chest: Effort normal.  Musculoskeletal:  Patient ambulates without aid with a normal gait. She has good distal strength.  Neurological: She is alert and oriented to person, place, and time.  Skin: Skin is warm and dry. No rash noted. No erythema.  Psychiatric: She has a normal mood and affect. Her behavior is normal.  Nursing note and vitals reviewed.   Ortho Exam Imaging: No results found.  Past Medical/Family/Surgical/Social History: Medications & Allergies reviewed per EMR Patient Active Problem List   Diagnosis Date Noted  . Leg weakness 07/08/2014  . Difficulty walking 07/08/2014  . Vaginal dryness   . Reiters syndrome   . Raynaud disease   . Fibromyalgia   . Arthritis   . Osteopenia    Past Medical History:  Diagnosis Date  . Arthritis   . Fibromyalgia   . Macular degeneration    Left eye  . Osteopenia 12/2014   T score -2.1 FRAX 8.6%/1.1%  . Raynaud disease   . Reiters syndrome   . Sleep disturbance   . Vaginal dryness    Family History  Problem Relation Age of Onset  . Hypertension Sister   . Hyperlipidemia Sister   . Heart disease Brother   . Hyperlipidemia Brother   . Alzheimer's disease Mother    Past Surgical History:  Procedure Laterality Date  . BREAST SURGERY     LEFT BR LUMP-Benign   Social History   Occupational History  . Not on file.   Social History  Main Topics  . Smoking status: Never Smoker  . Smokeless tobacco: Never Used  . Alcohol use No  . Drug use: No  . Sexual activity: Yes    Birth control/ protection: Post-menopausal     Comment: 1st intercourse 61 yo- Fewer than 5 partners

## 2016-12-20 ENCOUNTER — Ambulatory Visit (HOSPITAL_COMMUNITY): Payer: 59 | Admitting: Physical Therapy

## 2016-12-23 ENCOUNTER — Ambulatory Visit (HOSPITAL_COMMUNITY): Payer: 59 | Attending: Physical Medicine and Rehabilitation | Admitting: Physical Therapy

## 2016-12-23 DIAGNOSIS — M5442 Lumbago with sciatica, left side: Secondary | ICD-10-CM | POA: Diagnosis present

## 2016-12-23 DIAGNOSIS — M6281 Muscle weakness (generalized): Secondary | ICD-10-CM | POA: Diagnosis present

## 2016-12-23 DIAGNOSIS — R29898 Other symptoms and signs involving the musculoskeletal system: Secondary | ICD-10-CM | POA: Diagnosis present

## 2016-12-23 DIAGNOSIS — R262 Difficulty in walking, not elsewhere classified: Secondary | ICD-10-CM | POA: Insufficient documentation

## 2016-12-23 NOTE — Patient Instructions (Signed)
SCIATIC FLOSSING               Laying on your back, loop a towel or dog leash around the ball of your left foot.  Keep your left leg straight and lift it up as much as you can tolerate; at this point, use the towel to passively pull your toes to your nose, then point your toes the other direction.  It should be an "on", "off" sort of feeling.  Repeat 10-15 times, twice a day.    SEATED HAMSTRING STRETCH  While seated, rest your heel on the floor with your knee straight and gently lean forward until a stretch is felt behind your knee/thigh.  Hold for 30 seconds and relax.  Repeat 3-4 times on the left leg, twice a day.     PIRIFORMIS AND HIP STRETCH - SEATED (BUT NOT HUNCHING LIKE THIS GUY)  While sitting in a chair, cross your affected leg on top of the other as shown.   Next, gently lean forward until a stretch is felt along the crossed leg.  Hold for 30 seconds and repeat 3-4 times on the left, twice a day.

## 2016-12-23 NOTE — Therapy (Signed)
[place], [place], [address] Phone: [phone]   Fax:  [phone]  Physical Therapy Evaluation  Patient Details  Name: Monica [name] MRN: [id number] Date of Birth: 10-Apr-1956 Referring Provider: Magnus Sinning   Encounter Date: 12/23/2016      PT End of Session - 12/23/16 1743    Visit Number 1   Number of Visits 13   Date for PT Re-Evaluation 01/13/17   Authorization Type [employer]    Authorization Time Period 12/23/16 to 02/03/17   Authorization - Visit Number 1   Authorization - Number of Visits 10   PT Start Time 6599   PT Stop Time 1646   PT Time Calculation (min) 42 min   Activity Tolerance Patient tolerated treatment well   Behavior During Therapy [facility] for tasks assessed/performed      Past Medical History:  Diagnosis Date  . Arthritis   . Fibromyalgia   . Macular degeneration    Left eye  . Osteopenia 12/2014   T score -2.1 FRAX 8.6%/1.1%  . Raynaud disease   . Reiters syndrome   . Sleep disturbance   . Vaginal dryness     Past Surgical History:  Procedure Laterality Date  . BREAST SURGERY     LEFT BR LUMP-Benign    There were no vitals filed for this visit.       Subjective Assessment - 12/23/16 1607    Subjective Patient reports that on January 13th she sneezed and this threw her back out; she ended up in the ED on the 14th and saw her PCP  on the 15th, went back to the ED on the 16th. She was being treated with prednisone and other pain medicine which she has finished. She does not feel like she had improved and was referred to [facility] where she was given a steriod injection. Betwen the prednisone and the injection she was better but she is not sure if it is true improvement or just the medication. She still has a lot of pain in her back and has been woken by her pain. She is still having tingling in her foot and calf on the left. It is hard for her to vacuum  and perform similar tasks, her L leg gets  tired a lot and is tight and she has been trying to do some calf stretches. She has had prior back issues in the past. No acute bowel or bladder changes.    Pertinent History Raynauds, Reiters syndrome, fibromyalgia, OA, osteopenia    How long can you sit comfortably? improved but she has to get up after about an hour    How long can you stand comfortably? limited, 45 minutes max    How long can you walk comfortably? not sure    Patient Stated Goals reduce pain, return to PLOF, be able to go on a long trip in June    Currently in Pain? Yes   Pain Score 3    Pain Location Back   Pain Orientation Left   Pain Descriptors / Indicators Numbness;Shooting;Aching;Dull   Pain Type Acute pain   Pain Radiating Towards down L LE to toes    Pain Onset 1 to 4 weeks ago   Pain Frequency Constant   Aggravating Factors  rolling onto left side/being on L side, twisting    Pain Relieving Factors not a lot    Effect of Pain on Daily Activities severe impact  [facility] PT Assessment - 12/23/16 0001      Assessment   Medical Diagnosis back pain    Referring Provider Magnus Sinning    Onset Date/Surgical Date --  January 2018   Next MD Visit Dr. Ernestina Patches only if needed      Precautions   Precaution Comments caution with heavy lifting, no quick twisting      Balance Screen   Has the patient fallen in the past 6 months No   Has the patient had a decrease in activity level because of a fear of falling?  Yes   Is the patient reluctant to leave their home because of a fear of falling?  No     Prior Function   Level of Independence Independent;Independent with basic ADLs;Independent with gait;Independent with transfers   Vocation Full time employment   Higher education careers adviser for [employer]      Observation/Other Assessments   Observations scour, FABER (-) B; SLR (+) B; sciatic flossing test (+) L    Focus on Therapeutic  Outcomes (FOTO)  33% limited      AROM   Lumbar Flexion WFL; RFIS exacerbated symptoms    Lumbar Extension moderate limitation; REIS no change in radicular symptoms    Lumbar - Right Side Bend WFL    Lumbar - Left Side Bend WFL    Lumbar - Right Rotation WFL    Lumbar - Left Rotation mild limitation      Strength   Right Hip Flexion 4/5   Right Hip Extension 3+/5   Left Hip Flexion 4-/5   Left Hip Extension 3/5   Left Hip ABduction 4+/5   Right Knee Flexion 4/5   Right Knee Extension 5/5   Left Knee Flexion 4-/5   Left Knee Extension 4+/5   Right Ankle Dorsiflexion 5/5   Left Ankle Dorsiflexion 4+/5     Flexibility   Hamstrings moderate tightness noted L, mild tightness R    Piriformis moderate tightness L, mild tightness R      Palpation   Palpation comment increaed muscle spasm noted L paraspinals                            PT Education - 12/23/16 1743    Education provided Yes   Education Details prognosis, POC, HEP    Person(s) Educated Patient   Methods Explanation;Demonstration;Handout   Comprehension Verbalized understanding;Returned demonstration;Need further instruction          PT Short Term Goals - 12/23/16 1751      PT SHORT TERM GOAL #1   Title Patient to demonstrate resolution of L hamstring/piriformis impairment as well as lumbar ROM being WFL on all planes with no pain exacerbation in order to show improvement of condition    Time 3   Period Weeks   Status New     PT SHORT TERM GOAL #2   Title Patient to report that her radicular symptoms only reach her L knee in order to show improvement of condition    Time 3   Period Weeks   Status New     PT SHORT TERM GOAL #3   Title Patient to report she has been able to sleep through the night without waking due to pain in order to improve QOL    Time 3   Period Weeks   Status New     PT SHORT TERM GOAL #4   Title Patient  to be independent in correctly and consistently  performing targeted HEP, to be updated as appropriate    Time 1   Period Weeks   Status New           PT Long Term Goals - 12/23/16 1753      PT LONG TERM GOAL #1   Title Patient to demonstrate functional strength 5/5 in all tested muscle groups in order to improve function and reduce pain    Time 6   Period Weeks   Status New     PT LONG TERM GOAL #2   Title Patient to report resolution of radicular symptoms in order to show overall improvement of condition and improve functional task tolerance    Time 6   Period Weeks   Status New     PT LONG TERM GOAL #3   Title Patient to experience lumbar pain no more than 1/10 in order to improve QOL and improve functional task tolerance    Time 6   Period Weeks   Status New     PT LONG TERM GOAL #4   Title Patient to be participatory in regular aerobic exercise program, at least 3 days per week and 20 minutes in duration, in order to maintain functional gains and assist in preventing recurrence of symptoms    Time 6   Period Weeks   Status New               Plan - 12/23/16 1744    Clinical Impression Statement Patient arrives with ongoing low back pain/radicular symptoms that she reports started early this year after 2 sudden sneezes; she has been having radicular pain down to her L toes and has been on prednisone/back injection. While her symptoms have gotten better since she first had them, she also reports that she is not sure if it is the medicine or true improvement of her condition. Examination reveals significant muscle stiffness/impaired flexibility, functional muscle weakness, paraspinal spasm, poor functional task tolerance, and impaired general lumbar ROM. Suspect that due to tightness in hamstrings and piriformis L LE that sciatic immobility may be contributing to general symptoms. Unable to significantly improve symptoms with repeated extensions, but repeated flexion did increase symptoms. No significant hip pathology  identified. Patient will benefit from skilled PT services in order to address functional deficits, reduce pain, and optimize overall level of function.    Rehab Potential Good   Clinical Impairments Affecting Rehab Potential (+) appears motivated to participate in skilled PT services, fairly good prior health status; (-) has had back problems in the past, sedentary lifestyle    PT Frequency 2x / week   PT Duration 6 weeks   PT Treatment/Interventions ADLs/Self Care Home Management;Biofeedback;Cryotherapy;Moist Heat;Gait training;Stair training;Functional mobility training;Therapeutic activities;Therapeutic exercise;Balance training;Neuromuscular re-education;Patient/family education;Manual techniques;Passive range of motion;Dry needling;Energy conservation;Taping   PT Next Visit Plan review initial eval/HEP/goals; AVOID FLEXION ACTIVITIES DUE TO RADICULAR SYMPTOMS. Lumbar rotations, core activation, hamstring/piriformis mobilty, sciatic mobility, STM paraspinals and L piriformis    PT Home Exercise Plan Eval: hamstring stretch avoiding lumbar flexion, piriformis stretch avoiding lumbar flexion, sciatic flossing    Consulted and Agree with Plan of Care Patient      Patient will benefit from skilled therapeutic intervention in order to improve the following deficits and impairments:  Improper body mechanics, Pain, Decreased coordination, Decreased mobility, Increased muscle spasms, Postural dysfunction, Decreased activity tolerance, Decreased range of motion, Decreased strength, Hypomobility, Impaired perceived functional ability, Difficulty walking, Impaired flexibility  Visit Diagnosis:  Acute left-sided low back pain with left-sided sciatica - Plan: PT plan of care cert/re-cert  Muscle weakness (generalized) - Plan: PT plan of care cert/re-cert  Difficulty in walking, not elsewhere classified - Plan: PT plan of care cert/re-cert  Other symptoms and signs involving the musculoskeletal system -  Plan: PT plan of care cert/re-cert      G-Codes - 89/84/21 1755    Functional Assessment Tool Used (Outpatient Only) Based on skilled clinical assessment of strength, ROM, pain patterns, flexibility, radicular symptoms    Functional Limitation Mobility: Walking and moving around   Mobility: Walking and Moving Around Current Status ([id number]) At least 20 percent but less than 40 percent impaired, limited or restricted   Mobility: Walking and Moving Around Goal Status [phone]) At least 1 percent but less than 20 percent impaired, limited or restricted       Problem List Patient Active Problem List   Diagnosis Date Noted  . Leg weakness 07/08/2014  . Difficulty walking 07/08/2014  . Vaginal dryness   . Reiters syndrome   . Raynaud disease   . Fibromyalgia   . Arthritis   . Osteopenia     Deniece Ree PT, DPT [place] [address] [place], [place], [address] Phone: [phone]   Fax:  [phone]  Name: Monica [name] MRN: [id number] Date of Birth: [birth year]

## 2016-12-24 ENCOUNTER — Encounter: Payer: Self-pay | Admitting: Gynecology

## 2016-12-24 ENCOUNTER — Ambulatory Visit (INDEPENDENT_AMBULATORY_CARE_PROVIDER_SITE_OTHER): Payer: 59 | Admitting: Gynecology

## 2016-12-24 VITALS — BP 122/80 | Ht 63.5 in | Wt 134.2 lb

## 2016-12-24 DIAGNOSIS — N952 Postmenopausal atrophic vaginitis: Secondary | ICD-10-CM

## 2016-12-24 DIAGNOSIS — N951 Menopausal and female climacteric states: Secondary | ICD-10-CM | POA: Diagnosis not present

## 2016-12-24 DIAGNOSIS — M8580 Other specified disorders of bone density and structure, unspecified site: Secondary | ICD-10-CM

## 2016-12-24 DIAGNOSIS — Z01411 Encounter for gynecological examination (general) (routine) with abnormal findings: Secondary | ICD-10-CM | POA: Diagnosis not present

## 2016-12-24 MED ORDER — ESTRADIOL 10 MCG VA TABS: 1.0000 | ORAL_TABLET | VAGINAL | 12 refills | Status: DC

## 2016-12-24 NOTE — Progress Notes (Signed)
    Monica [name] May 11, 1956 [id number]        61 y.o.  [id number] for annual exam.    Past medical history,surgical history, problem list, medications, allergies, family history and social history were all reviewed and documented as reviewed in the EPIC chart.  ROS:  Performed with pertinent positives and negatives included in the history, assessment and plan.   Additional significant findings :  None   Exam: Journalist, newspaper Vitals:   12/24/16 1455  BP: 122/80  Weight: 134 lb 3.2 oz (60.9 kg)  Height: 5' 3.5" (1.613 m)   Body mass index is 23.4 kg/m.  General appearance:  Normal affect, orientation and appearance. Skin: Grossly normal HEENT: Without gross lesions.  No cervical or supraclavicular adenopathy. Thyroid normal.  Lungs:  Clear without wheezing, rales or rhonchi Cardiac: RR, without RMG Abdominal:  Soft, nontender, without masses, guarding, rebound, organomegaly or hernia Breasts:  Examined lying and sitting without masses, retractions, discharge or axillary adenopathy. Pelvic:  Ext, BUS, Vagina: With atrophic changes  Cervix: With atrophic changes  Uterus: Anteverted, normal size, shape and contour, midline and mobile nontender   Adnexa: Without masses or tenderness    Anus and perineum: Normal   Rectovaginal: Normal sphincter tone without palpated masses or tenderness.    Assessment/Plan:  61 y.o. [id number] female for annual exam.   1. Postmenopausal/atrophic genital changes. Had been on Vagifem 10 g twice weekly. Discontinued this and is now having issues with vaginal dryness. Also notes some hot flushes and sweats although tolerable. No vaginal bleeding. Wants to reinitiate the Vagifem. We've discussed the issues of absorption and systemic effects to include thrombosis, breast ablation and endometrial stimulation. Vagifem 10 g twice weekly 1 year prescribed. Will follow up if her menopausal symptoms warrant treatment. 2. Osteopenia. DEXA 12/2014 T score -2.1  FRAX 8.6%/1.1%. Schedule DEXA now to year interval and she agrees to schedule an follow up for this. 3. Mammography 2016. Patient knows she is overdue agrees to call schedule. SBE monthly reviewed. 4. Pap smear 11/2014. No Pap smear done today. No history of significant abnormal Pap smears. Plan repeat Pap smear next year at 3 year interval per current screening guidelines. 5. Colonoscopy due now and patient knows to call schedule. 6. Health maintenance. No routine lab work done as patient reports this done elsewhere. Follow up for bone density otherwise 1 year, sooner as needed.   [name] MD, 3:18 PM 12/24/2016

## 2016-12-24 NOTE — Patient Instructions (Addendum)
Follow up for bone density as scheduled.  Schedule your mammogram.  Schedule your colonoscopy.

## 2016-12-28 ENCOUNTER — Ambulatory Visit (HOSPITAL_COMMUNITY): Payer: 59

## 2016-12-28 DIAGNOSIS — M5442 Lumbago with sciatica, left side: Secondary | ICD-10-CM

## 2016-12-28 DIAGNOSIS — R29898 Other symptoms and signs involving the musculoskeletal system: Secondary | ICD-10-CM

## 2016-12-28 DIAGNOSIS — R262 Difficulty in walking, not elsewhere classified: Secondary | ICD-10-CM

## 2016-12-28 DIAGNOSIS — M6281 Muscle weakness (generalized): Secondary | ICD-10-CM

## 2016-12-28 NOTE — Therapy (Signed)
[place], [place], [address] Phone: [phone]   Fax:  [phone]  Physical Therapy Treatment  Patient Details  Name: Monica [name] MRN: [id number] Date of Birth: [birth year] Referring Provider: Magnus Sinning   Encounter Date: 12/28/2016      PT End of Session - 12/28/16 1620    Visit Number 2   Number of Visits 13   Date for PT Re-Evaluation 01/13/17   Authorization Type [employer]    Authorization Time Period 12/23/16 to 02/03/17   Authorization - Visit Number 2   Authorization - Number of Visits 10   PT Start Time 3419   PT Stop Time 6222   PT Time Calculation (min) 45 min   Activity Tolerance Patient tolerated treatment well;No increased pain  No increased pain, did reports intermittent radicular symptoms to Lt ankle during sessin   Behavior During Therapy [facility] for tasks assessed/performed      Past Medical History:  Diagnosis Date  . Arthritis   . Fibromyalgia   . Macular degeneration    Left eye  . Osteopenia 12/2014   T score -2.1 FRAX 8.6%/1.1%  . Raynaud disease   . Reiters syndrome   . Sleep disturbance   . Vaginal dryness     Past Surgical History:  Procedure Laterality Date  . BREAST SURGERY     LEFT BR LUMP-Benign    There were no vitals filed for this visit.      Subjective Assessment - 12/28/16 1610    Subjective Pt reports she is currently pain free today.  Reports compliance with HEP daily without questions concerning.   Pertinent History Raynauds, Reiters syndrome, fibromyalgia, OA, osteopenia    Patient Stated Goals reduce pain, return to PLOF, be able to go on a long trip in June    Currently in Pain? No/denies               [facility] Adult PT Treatment/Exercise - 12/28/16 0001      Exercises   Exercises Lumbar     Lumbar Exercises: Stretches   Active Hamstring Stretch 3 reps;30 seconds   Lower Trunk Rotation Limitations 10x 10"    Prone on Elbows  Stretch Limitations 2 min complete during manual   Piriformis Stretch 2 reps;30 seconds   Piriformis Stretch Limitations seated with legs crossed, no lumbar flexion     Lumbar Exercises: Supine   Ab Set 10 reps;3 seconds   Bridge 10 reps   Other Supine Lumbar Exercises Manual sciatic nerve flossing     Manual Therapy   Manual Therapy Other (comment);Soft tissue mobilization   Manual therapy comments Manual technqiues complete separate rest of tx   Soft tissue mobilization Paraspinals and Lt piriformis   Other Manual Therapy Manual sciatic nerve glides in supine                PT Education - 12/28/16 1621    Education provided Yes   Education Details Reviewed goals, assured compliance with HEP with copy of eval given to pt.   Person(s) Educated Patient   Methods Explanation;Demonstration;Handout   Comprehension Verbalized understanding;Returned demonstration;Need further instruction          PT Short Term Goals - 12/23/16 1751      PT SHORT TERM GOAL #1   Title Patient to demonstrate resolution of L hamstring/piriformis impairment as well as lumbar ROM being WFL on all planes with no pain exacerbation in order to show improvement  of condition    Time 3   Period Weeks   Status New     PT SHORT TERM GOAL #2   Title Patient to report that her radicular symptoms only reach her L knee in order to show improvement of condition    Time 3   Period Weeks   Status New     PT SHORT TERM GOAL #3   Title Patient to report she has been able to sleep through the night without waking due to pain in order to improve QOL    Time 3   Period Weeks   Status New     PT SHORT TERM GOAL #4   Title Patient to be independent in correctly and consistently performing targeted HEP, to be updated as appropriate    Time 1   Period Weeks   Status New           PT Long Term Goals - 12/23/16 1753      PT LONG TERM GOAL #1   Title Patient to demonstrate functional strength 5/5 in  all tested muscle groups in order to improve function and reduce pain    Time 6   Period Weeks   Status New     PT LONG TERM GOAL #2   Title Patient to report resolution of radicular symptoms in order to show overall improvement of condition and improve functional task tolerance    Time 6   Period Weeks   Status New     PT LONG TERM GOAL #3   Title Patient to experience lumbar pain no more than 1/10 in order to improve QOL and improve functional task tolerance    Time 6   Period Weeks   Status New     PT LONG TERM GOAL #4   Title Patient to be participatory in regular aerobic exercise program, at least 3 days per week and 20 minutes in duration, in order to maintain functional gains and assist in preventing recurrence of symptoms    Time 6   Period Weeks   Status New               Plan - 12/28/16 1658    Clinical Impression Statement Reviewed goals, assured compliance and proper form/technqiue with HEP and copy of eval given to pt.  Session focus on reducing radicular symptoms with extension based exercises and manual techniques for nerve glides and STM to address tightness in paraspinals and piriformis.  Noted muscle spasm/knot above Lt PIIS, possible SI alingment issue, needs to be further assessed.  No reports of increased pain through session.  Did report intermittent radicular symptoms down Lt LE.     Rehab Potential Good   Clinical Impairments Affecting Rehab Potential (+) appears motivated to participate in skilled PT services, fairly good prior health status; (-) has had back problems in the past, sedentary lifestyle    PT Frequency 2x / week   PT Duration 6 weeks   PT Treatment/Interventions ADLs/Self Care Home Management;Biofeedback;Cryotherapy;Moist Heat;Gait training;Stair training;Functional mobility training;Therapeutic activities;Therapeutic exercise;Balance training;Neuromuscular re-education;Patient/family education;Manual techniques;Passive range of motion;Dry  needling;Energy conservation;Taping   PT Next Visit Plan Next session: trial with prone press-ups to address radicular symptoms.  Possibly assess SI alignment.  AVOID FLEXION ACTIVITIES DUE TO RADICULAR SYMPTOMS. Continue lumbar rotations, core activation, hamstring/piriformis mobilty, sciatic mobility, STM paraspinals and L piriformis    PT Home Exercise Plan Eval: hamstring stretch avoiding lumbar flexion, piriformis stretch avoiding lumbar flexion, sciatic flossing  Patient will benefit from skilled therapeutic intervention in order to improve the following deficits and impairments:  Improper body mechanics, Pain, Decreased coordination, Decreased mobility, Increased muscle spasms, Postural dysfunction, Decreased activity tolerance, Decreased range of motion, Decreased strength, Hypomobility, Impaired perceived functional ability, Difficulty walking, Impaired flexibility  Visit Diagnosis: Acute left-sided low back pain with left-sided sciatica  Muscle weakness (generalized)  Difficulty in walking, not elsewhere classified  Other symptoms and signs involving the musculoskeletal system     Problem List Patient Active Problem List   Diagnosis Date Noted  . Leg weakness 07/08/2014  . Difficulty walking 07/08/2014  . Vaginal dryness   . Reiters syndrome   . Raynaud disease   . Fibromyalgia   . Arthritis   . Osteopenia    Ihor Austin, LPTA; [place]  Aldona Lento 12/28/2016, 5:08 PM  [place] [address] [place], [place], [address] Phone: [phone]   Fax:  [phone]  Name: DESIREA MIZRAHI MRN: [id number] Date of Birth: 06/19/1956

## 2016-12-30 ENCOUNTER — Ambulatory Visit (HOSPITAL_COMMUNITY): Payer: 59 | Admitting: Physical Therapy

## 2016-12-30 DIAGNOSIS — R29898 Other symptoms and signs involving the musculoskeletal system: Secondary | ICD-10-CM

## 2016-12-30 DIAGNOSIS — M5442 Lumbago with sciatica, left side: Secondary | ICD-10-CM | POA: Diagnosis not present

## 2016-12-30 DIAGNOSIS — M6281 Muscle weakness (generalized): Secondary | ICD-10-CM

## 2016-12-30 DIAGNOSIS — R262 Difficulty in walking, not elsewhere classified: Secondary | ICD-10-CM

## 2016-12-30 NOTE — Therapy (Signed)
[place], [place], [address] Phone: [phone]   Fax:  [phone]  Physical Therapy Treatment  Patient Details  Name: Monica [name] MRN: [id number] Date of Birth: 1956-02-08 Referring Provider: Magnus Sinning   Encounter Date: 12/30/2016      PT End of Session - 12/30/16 1701    Visit Number 3   Number of Visits 13   Date for PT Re-Evaluation 01/13/17   Authorization Type [employer]    Authorization Time Period 12/23/16 to 02/03/17   Authorization - Visit Number 3   Authorization - Number of Visits 10   PT Start Time 6629   PT Stop Time 4765   PT Time Calculation (min) 44 min   Activity Tolerance Patient tolerated treatment well;No increased pain   Behavior During Therapy WFL for tasks assessed/performed      Past Medical History:  Diagnosis Date  . Arthritis   . Fibromyalgia   . Macular degeneration    Left eye  . Osteopenia 12/2014   T score -2.1 FRAX 8.6%/1.1%  . Raynaud disease   . Reiters syndrome   . Sleep disturbance   . Vaginal dryness     Past Surgical History:  Procedure Laterality Date  . BREAST SURGERY     LEFT BR LUMP-Benign    There were no vitals filed for this visit.      Subjective Assessment - 12/30/16 1602    Subjective Pt reports that she is experiencing increased pain today possibly following her stretches. She has no new issues to report.    Pertinent History Raynauds, Reiters syndrome, fibromyalgia, OA, osteopenia    Patient Stated Goals reduce pain, return to PLOF, be able to go on a long trip in June    Currently in Pain? Yes   Pain Score 3    Pain Location Back   Pain Orientation Left   Pain Descriptors / Indicators Aching;Dull   Pain Type Acute pain   Pain Radiating Towards none currently    Pain Onset More than a month ago   Pain Frequency Intermittent   Aggravating Factors  not sure today   Pain Relieving Factors topical cream helped                           [facility] Adult PT Treatment/Exercise - 12/30/16 0001      Lumbar Exercises: Standing   Other Standing Lumbar Exercises hip abduction isometric against wall 10x3 sec hold, x2 set each      Lumbar Exercises: Supine   Bridge 10 reps   Bridge Limitations x2 sets      Lumbar Exercises: Prone   Straight Leg Raise 10 reps   Straight Leg Raises Limitations Lt and Rt    Opposite Arm/Leg Raise 10 reps;Left arm/Right leg;Right arm/Left leg   Other Prone Lumbar Exercises repeated extension on elbows x15 reps, symptoms     Reviewed technique with piriformis stretch on Lt, x20 sec Reviewed technique with Lt sciatic nerve glide on Lt x3 reps            PT Education - 12/30/16 1645    Education provided Yes   Education Details reviewed HEP; discussed nature of disc pathology and noted preferences of extension; addition to HEP   Person(s) Educated Patient   Methods Explanation;Demonstration;Verbal cues;Handout   Comprehension Returned demonstration;Verbalized understanding          PT Short Term Goals -  12/23/16 1751      PT SHORT TERM GOAL #1   Title Patient to demonstrate resolution of L hamstring/piriformis impairment as well as lumbar ROM being WFL on all planes with no pain exacerbation in order to show improvement of condition    Time 3   Period Weeks   Status New     PT SHORT TERM GOAL #2   Title Patient to report that her radicular symptoms only reach her L knee in order to show improvement of condition    Time 3   Period Weeks   Status New     PT SHORT TERM GOAL #3   Title Patient to report she has been able to sleep through the night without waking due to pain in order to improve QOL    Time 3   Period Weeks   Status New     PT SHORT TERM GOAL #4   Title Patient to be independent in correctly and consistently performing targeted HEP, to be updated as appropriate    Time 1   Period Weeks   Status New           PT Long  Term Goals - 12/23/16 1753      PT LONG TERM GOAL #1   Title Patient to demonstrate functional strength 5/5 in all tested muscle groups in order to improve function and reduce pain    Time 6   Period Weeks   Status New     PT LONG TERM GOAL #2   Title Patient to report resolution of radicular symptoms in order to show overall improvement of condition and improve functional task tolerance    Time 6   Period Weeks   Status New     PT LONG TERM GOAL #3   Title Patient to experience lumbar pain no more than 1/10 in order to improve QOL and improve functional task tolerance    Time 6   Period Weeks   Status New     PT LONG TERM GOAL #4   Title Patient to be participatory in regular aerobic exercise program, at least 3 days per week and 20 minutes in duration, in order to maintain functional gains and assist in preventing recurrence of symptoms    Time 6   Period Weeks   Status New               Plan - 12/30/16 1703    Clinical Impression Statement Pt arrived today reporting increase in symptoms following 1 or 2 of her exercises. Therapist reviewed each with some adjustments made to decrease pain. The remainder of today's session focused on repeated extension movements and trunk/hip strengthening. Pt reported centralization and decrease in symptoms following repeated extension on her elbows, and her symptoms further improved with repeated extension with elbows extended. Ended session without increase in pain.    Rehab Potential Good   Clinical Impairments Affecting Rehab Potential (+) appears motivated to participate in skilled PT services, fairly good prior health status; (-) has had back problems in the past, sedentary lifestyle    PT Frequency 2x / week   PT Duration 6 weeks   PT Treatment/Interventions ADLs/Self Care Home Management;Biofeedback;Cryotherapy;Moist Heat;Gait training;Stair training;Functional mobility training;Therapeutic activities;Therapeutic exercise;Balance  training;Neuromuscular re-education;Patient/family education;Manual techniques;Passive range of motion;Dry needling;Energy conservation;Taping   PT Next Visit Plan Next session: trial with prone press-ups to address radicular symptoms.  Possibly assess SI alignment.  AVOID FLEXION ACTIVITIES DUE TO RADICULAR SYMPTOMS. Continue lumbar rotations, core activation,  hamstring/piriformis mobilty, sciatic mobility, STM paraspinals and L piriformis    PT Home Exercise Plan piriformis stretch avoiding lumbar flexion, sciatic flossing; repeated extension in prone and standing    Consulted and Agree with Plan of Care Patient      Patient will benefit from skilled therapeutic intervention in order to improve the following deficits and impairments:  Improper body mechanics, Pain, Decreased coordination, Decreased mobility, Increased muscle spasms, Postural dysfunction, Decreased activity tolerance, Decreased range of motion, Decreased strength, Hypomobility, Impaired perceived functional ability, Difficulty walking, Impaired flexibility  Visit Diagnosis: Acute left-sided low back pain with left-sided sciatica  Muscle weakness (generalized)  Difficulty in walking, not elsewhere classified  Other symptoms and signs involving the musculoskeletal system     Problem List Patient Active Problem List   Diagnosis Date Noted  . Leg weakness 07/08/2014  . Difficulty walking 07/08/2014  . Vaginal dryness   . Reiters syndrome   . Raynaud disease   . Fibromyalgia   . Arthritis   . Osteopenia     5:27 PM,12/30/16 Elly Modena PT, DPT [facility] Out[facility] Physical Therapy [place] [address] [place], [place], [address] Phone: [phone]   Fax:  [phone]  Name: Monica [name] MRN: [id number] Date of Birth: May 04, 1956

## 2017-01-03 ENCOUNTER — Telehealth (HOSPITAL_COMMUNITY): Payer: Self-pay | Admitting: Family Medicine

## 2017-01-03 NOTE — Telephone Encounter (Signed)
01/03/17 pt left a message to cx the 01/22/23 appt because of the death of a close friend

## 2017-01-04 ENCOUNTER — Ambulatory Visit (HOSPITAL_COMMUNITY): Payer: 59

## 2017-01-05 ENCOUNTER — Telehealth (HOSPITAL_COMMUNITY): Payer: Self-pay | Admitting: Family Medicine

## 2017-01-05 NOTE — Telephone Encounter (Signed)
01/05/17 pt cx because she said that something had come up.  I told her that if an earlier appt on Thursday opened up I would call  her

## 2017-01-06 ENCOUNTER — Other Ambulatory Visit: Payer: Self-pay | Admitting: Rheumatology

## 2017-01-06 ENCOUNTER — Ambulatory Visit (HOSPITAL_COMMUNITY): Payer: 59 | Admitting: Physical Therapy

## 2017-01-06 NOTE — Progress Notes (Signed)
Office Visit Note  Patient: Monica [name]             Date of Birth: [birth year]           MRN: [id number]             PCP: Robert Bellow, MD Referring: Lemmie Evens, MD Visit Date: 01/17/2017 Occupation: '@GUAROCC'$ @    Subjective:  Left wrist pain   History of Present Illness: Monica [name] is a 61 y.o. female with history of inflammatory arthritis and this disease. She had lumbar spine injection by Dr. Alfonse Spruce and is going for physical therapy now. She states at physical therapy she's been doing some exercises which are putting extra stress on her left wrist joint which is been hurting some. With the weather change she's been having some discomfort in her hands and her knee joints. She had some pedal edema in March which is resolved now.   Activities of Daily Living:  Patient reports morning stiffness for minutes.   Patient Reports nocturnal pain.  Difficulty dressing/grooming: Denies Difficulty climbing stairs: Reports Difficulty getting out of chair: Reports Difficulty using hands for taps, buttons, cutlery, and/or writing: Denies   Review of Systems  Constitutional: Negative for fatigue, night sweats, weight gain, weight loss and weakness.  HENT: Negative for mouth sores, trouble swallowing, trouble swallowing, mouth dryness and nose dryness.   Eyes: Negative for pain, redness, visual disturbance and dryness.  Respiratory: Negative for cough, shortness of breath and difficulty breathing.   Cardiovascular: Negative for chest pain, palpitations, hypertension, irregular heartbeat and swelling in legs/feet.  Gastrointestinal: Positive for constipation. Negative for blood in stool and diarrhea.  Endocrine: Negative for increased urination.  Genitourinary: Negative for vaginal dryness.  Musculoskeletal: Positive for arthralgias, joint pain, myalgias, morning stiffness and myalgias. Negative for joint swelling, muscle weakness and muscle tenderness.  Skin: Negative for  color change, rash, hair loss, skin tightness, ulcers and sensitivity to sunlight.  Allergic/Immunologic: Negative for susceptible to infections.  Neurological: Negative for dizziness, memory loss and night sweats.  Hematological: Negative for swollen glands.  Psychiatric/Behavioral: Positive for sleep disturbance. Negative for depressed mood. The patient is not nervous/anxious.     PMFS History:  Patient Active Problem List   Diagnosis Date Noted  . High risk medication use 01/13/2017  . History of iritis 01/13/2017  . Essential hypertension 01/13/2017  . Macular degeneration 01/13/2017  . 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 Leg weakness 07/08/2014  . Difficulty walking 07/08/2014  . Vaginal dryness   . 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 Raynaud disease   . Fibromyalgia   . Inflammatory arthritis   . Osteopenia     Past Medical History:  Diagnosis Date  . Arthritis   . Fibromyalgia   . Macular degeneration    Left eye  . Osteopenia 12/2014   T score -2.1 FRAX 8.6%/1.1%  . Raynaud disease   . Reiters syndrome   . Sleep disturbance   . Vaginal dryness     Family History  Problem Relation Age of Onset  . Hypertension Sister   . Hyperlipidemia Sister   . Heart disease Brother   . Hyperlipidemia Brother   . Alzheimer's disease Mother    Past Surgical History:  Procedure Laterality Date  . BREAST SURGERY     LEFT BR LUMP-Benign   Social History   Social History Narrative  . No narrative on file     Objective: Vital Signs: BP  118/72   Pulse 72   Resp 14   Wt 137 lb (62.1 kg)   BMI 23.89 kg/m    Physical Exam  Constitutional: She is oriented to person, place, and time. She appears well-developed and well-nourished.  HENT:  Head: Normocephalic and atraumatic.  Eyes: Conjunctivae and EOM are normal.  Neck: Normal range of motion.  Cardiovascular: Normal rate, regular rhythm, normal heart sounds and intact distal pulses.    Pulmonary/Chest: Effort normal and breath sounds normal.  Abdominal: Soft. Bowel sounds are normal.  Lymphadenopathy:    She has no cervical adenopathy.  Neurological: She is alert and oriented to person, place, and time.  Skin: Skin is warm and dry. Capillary refill takes less than 2 seconds.  Psychiatric: She has a normal mood and affect. Her behavior is normal.  Nursing note and vitals reviewed.    Musculoskeletal Exam: C-spine and thoracic spine good range of motion she has limited painful range of motion of her lumbar spine no SI joint tenderness was noted. Shoulder joints elbow joints wrist joint MCPs PIPs DIPs with good range of motion with no synovitis. She had some mild tenosynovitis in the left wrist flexor tendons. Hip joints knee joints ankles MTPs PIPs with good range of motion with no synovitis.  CDAI Exam: No CDAI exam completed.    Investigation: Findings:  03/26/2015 per EULAR recommendations, ultrasound examination of right hand, 2nd, 3rd and 5th MCP joints and right wrist joint, both dorsal and volar aspects, were evaluated.  The findings were she had no synovitis in her MCP joints.  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  It was 1.17 cm2. On the longitudinal view, I did see narrowing of the median nerve.  These findings are consistent with carpal tunnel syndrome.     IMPRESSION AND PLAN:  Seronegative inflammatory arthritis.  She is having a flare with increased pain in her wrist joint.  She also has some synovitis in her wrist joint from the ultrasound examination.  She recently has been having pain in her bilateral wrist joints.  Her findings are also consistent with carpal tunnel syndrome.  She is on high-risk prescription, taking sulfasalazine.  She has been under a lot of stress at work.  She believes that could have flared her symptoms.  I have advised her to get carpal tunnel braces.  I also gave her prednisone dose pack.  She  will take 5 mg tablet, 20 mg for 2 days, 15 for 2 days, 10 for 2 days and 5 for 2 days.  We will continue sulfasalazine.  If her symptoms do not improve, then I can consider doing cortisone injection for her carpal tunnel syndrome.  She will be returning for followup next month as scheduled.  Face to face time spent with patient was 30 minutes, 50% time was spent in counseling and coordination of care.    10/26/2016 CBC normal, CMP normal, UA normal   No visits with results within 2 Month(s) from this visit.  Latest known visit with results is:  Admission on 10/26/2016, Discharged on 10/26/2016  Component Date Value Ref Range Status  . Color, Urine 10/26/2016 YELLOW  YELLOW Final  . APPearance 10/26/2016 CLEAR  CLEAR Final  . Specific Gravity, Urine 10/26/2016 1.010  1.005 - 1.030 Final  . pH 10/26/2016 5.5  5.0 - 8.0 Final  . Glucose, UA 10/26/2016 NEGATIVE  NEGATIVE mg/dL Final  . Hgb urine dipstick 10/26/2016 NEGATIVE  NEGATIVE Final  . Bilirubin Urine 10/26/2016 NEGATIVE  NEGATIVE Final  . Ketones, ur 10/26/2016 NEGATIVE  NEGATIVE mg/dL Final  . Protein, ur 10/26/2016 NEGATIVE  NEGATIVE mg/dL Final  . Nitrite 10/26/2016 NEGATIVE  NEGATIVE Final  . Leukocytes, UA 10/26/2016 NEGATIVE  NEGATIVE Final  . WBC 10/26/2016 8.5  4.0 - 10.5 K/uL Final  . RBC 10/26/2016 4.36  3.87 - 5.11 MIL/uL Final  . Hemoglobin 10/26/2016 13.0  12.0 - 15.0 g/dL Final  . HCT 10/26/2016 40.1  36.0 - 46.0 % Final  . MCV 10/26/2016 92.0  78.0 - 100.0 fL Final  . MCH 10/26/2016 29.8  26.0 - 34.0 pg Final  . MCHC 10/26/2016 32.4  30.0 - 36.0 g/dL Final  . RDW 10/26/2016 13.7  11.5 - 15.5 % Final  . Platelets 10/26/2016 307  150 - 400 K/uL Final  . Neutrophils Relative % 10/26/2016 70  % Final  . Neutro Abs 10/26/2016 6.0  1.7 - 7.7 K/uL Final  . Lymphocytes Relative 10/26/2016 22  % Final  . Lymphs Abs 10/26/2016 1.8  0.7 - 4.0 K/uL Final  . Monocytes Relative 10/26/2016 8  % Final  . Monocytes Absolute  10/26/2016 0.7  0.1 - 1.0 K/uL Final  . Eosinophils Relative 10/26/2016 0  % Final  . Eosinophils Absolute 10/26/2016 0.0  0.0 - 0.7 K/uL Final  . Basophils Relative 10/26/2016 0  % Final  . Basophils Absolute 10/26/2016 0.0  0.0 - 0.1 K/uL Final  . Sodium 10/26/2016 140  135 - 145 mmol/L Final  . Potassium 10/26/2016 3.6  3.5 - 5.1 mmol/L Final  . Chloride 10/26/2016 105  101 - 111 mmol/L Final  . CO2 10/26/2016 30  22 - 32 mmol/L Final  . Glucose, Bld 10/26/2016 110* 65 - 99 mg/dL Final  . BUN 10/26/2016 8  6 - 20 mg/dL Final  . Creatinine, Ser 10/26/2016 0.81  0.44 - 1.00 mg/dL Final  . Calcium 10/26/2016 9.0  8.9 - 10.3 mg/dL Final  . GFR calc non Af Amer 10/26/2016 >60  >60 mL/min Final  . GFR calc Af Amer 10/26/2016 >60  >60 mL/min Final   Comment: (NOTE) The eGFR has been calculated using the CKD EPI equation. This calculation has not been validated in all clinical situations. eGFR's persistently <60 mL/min signify possible Chronic Kidney Disease.   . Anion gap 10/26/2016 5  5 - 15 Final    Imaging: No results found.  Speciality Comments: No specialty comments available.    Procedures:  No procedures performed Allergies: Patient has no known allergies.   Assessment / Plan:     Visit Diagnoses: Inflammatory arthritis  High risk medication use - Sulfasalazine 500 mg 2 tablets twice a day, Celebrex 200 mg by mouth twice a day when necessary - Plan: CBC with Differential/Platelet, CMP14+EGFR, CBC with Differential/Platelet, CMP14+EGFR  History of iritis  Raynaud's disease without gangrene  Osteopenia, unspecified location  Fibromyalgia  Essential hypertension  Macular degeneration - Left eye  Spondylosis of lumbar region without myelopathy or radiculopathy   She also has history of hypertension, and history of left macular degeneration.   Orders: Orders Placed This Encounter  Procedures  . CBC with Differential/Platelet  . CMP14+EGFR   No orders of the  defined types were placed in this encounter.   Face-to-face time spent with patient was 30 minutes. 50% of time was spent in counseling and coordination of care.  Follow-Up Instructions: Return in about 5 months (around 06/19/2017) for Inflammatory arthritis.  Bo Merino, MD  Note - This record has been created using Editor, commissioning.  Chart creation errors have been sought, but may not always  have been located. Such creation errors do not reflect on  the standard of medical care.

## 2017-01-06 NOTE — Telephone Encounter (Signed)
ok 

## 2017-01-06 NOTE — Telephone Encounter (Signed)
Last Visit: 08/19/16 Next Visit: 01/17/17 Labs: 07/21/16 WNL  Okay to refill Celebrex?

## 2017-01-11 ENCOUNTER — Ambulatory Visit (HOSPITAL_COMMUNITY): Payer: 59 | Attending: Physical Medicine and Rehabilitation | Admitting: Physical Therapy

## 2017-01-11 DIAGNOSIS — M5442 Lumbago with sciatica, left side: Secondary | ICD-10-CM | POA: Diagnosis not present

## 2017-01-11 DIAGNOSIS — M6281 Muscle weakness (generalized): Secondary | ICD-10-CM | POA: Insufficient documentation

## 2017-01-11 DIAGNOSIS — R29898 Other symptoms and signs involving the musculoskeletal system: Secondary | ICD-10-CM

## 2017-01-11 DIAGNOSIS — R262 Difficulty in walking, not elsewhere classified: Secondary | ICD-10-CM | POA: Diagnosis present

## 2017-01-11 NOTE — Therapy (Signed)
[place], [place], [address] Phone: [phone]   Fax:  [phone]  Physical Therapy Treatment  Patient Details  Name: Monica [name] MRN: [id number] Date of Birth: 1956/03/22 Referring Provider: Magnus Sinning   Encounter Date: 01/11/2017      PT End of Session - 01/11/17 1643    Visit Number 4   Number of Visits 13   Date for PT Re-Evaluation 01/13/17   Authorization Type [employer]    Authorization Time Period 12/23/16 to 02/03/17   Authorization - Visit Number 4   Authorization - Number of Visits 10   PT Start Time 8563   PT Stop Time 1497   PT Time Calculation (min) 42 min   Activity Tolerance Patient tolerated treatment well;No increased pain   Behavior During Therapy WFL for tasks assessed/performed      Past Medical History:  Diagnosis Date  . Arthritis   . Fibromyalgia   . Macular degeneration    Left eye  . Osteopenia 12/2014   T score -2.1 FRAX 8.6%/1.1%  . Raynaud disease   . Reiters syndrome   . Sleep disturbance   . Vaginal dryness     Past Surgical History:  Procedure Laterality Date  . BREAST SURGERY     LEFT BR LUMP-Benign    There were no vitals filed for this visit.      Subjective Assessment - 01/11/17 1603    Subjective Pt reports that things have been good up until last night when she was performing some of her exercises. She thinks she twisted her back the wrong way or something, because the pain woke her up in the middle of the night. She took some Tylenol and it has been relatively mild since then.    Pertinent History Raynauds, Reiters syndrome, fibromyalgia, OA, osteopenia    Patient Stated Goals reduce pain, return to PLOF, be able to go on a long trip in June    Currently in Pain? No/denies   Pain Onset More than a month ago                         [facility] Adult PT Treatment/Exercise - 01/11/17 0001      Lumbar Exercises: Stretches   Piriformis Stretch 2 reps;30 seconds   Piriformis Stretch Limitations seated      Lumbar Exercises: Standing   Other Standing Lumbar Exercises hip abduction isometric hold against wall 15x3 sec each.      Lumbar Exercises: Supine   Bent Knee Raise 15 reps   Bent Knee Raise Limitations red TB around knees, ball squeeze between ankles    Bridge 10 reps   Bridge Limitations x1 set, followed by bridge hold with alt knee extension   Other Supine Lumbar Exercises Supine knees at 90/90 with red TB/ankle squeeze, UE/trunk rotation x10 reps, followed by trunk rotation holding yellow weighted ball 2x5 reps      Lumbar Exercises: Prone   Opposite Arm/Leg Raise Left arm/Right leg;Right arm/Left leg;10 reps   Other Prone Lumbar Exercises repeated extension on hands x10 reps      Manual Therapy   Manual Therapy Myofascial release   Manual therapy comments separate rest of session   Myofascial Release Lt piriformis trigger point release, contract, relax and stretch x5 reps                 PT Education - 01/11/17 1640    Education  provided Yes   Education Details technique with therex; encouraged continued HEP adherence especially when pain is increased   Person(s) Educated Patient   Methods Explanation;Demonstration;Verbal cues   Comprehension Verbalized understanding;Returned demonstration          PT Short Term Goals - 12/23/16 1751      PT SHORT TERM GOAL #1   Title Patient to demonstrate resolution of L hamstring/piriformis impairment as well as lumbar ROM being WFL on all planes with no pain exacerbation in order to show improvement of condition    Time 3   Period Weeks   Status New     PT SHORT TERM GOAL #2   Title Patient to report that her radicular symptoms only reach her L knee in order to show improvement of condition    Time 3   Period Weeks   Status New     PT SHORT TERM GOAL #3   Title Patient to report she has been able to sleep through the night without  waking due to pain in order to improve QOL    Time 3   Period Weeks   Status New     PT SHORT TERM GOAL #4   Title Patient to be independent in correctly and consistently performing targeted HEP, to be updated as appropriate    Time 1   Period Weeks   Status New           PT Long Term Goals - 12/23/16 1753      PT LONG TERM GOAL #1   Title Patient to demonstrate functional strength 5/5 in all tested muscle groups in order to improve function and reduce pain    Time 6   Period Weeks   Status New     PT LONG TERM GOAL #2   Title Patient to report resolution of radicular symptoms in order to show overall improvement of condition and improve functional task tolerance    Time 6   Period Weeks   Status New     PT LONG TERM GOAL #3   Title Patient to experience lumbar pain no more than 1/10 in order to improve QOL and improve functional task tolerance    Time 6   Period Weeks   Status New     PT LONG TERM GOAL #4   Title Patient to be participatory in regular aerobic exercise program, at least 3 days per week and 20 minutes in duration, in order to maintain functional gains and assist in preventing recurrence of symptoms    Time 6   Period Weeks   Status New               Plan - 01/11/17 1643    Clinical Impression Statement Pt reporting improvements in pain/symptoms over her time off up until increase in pain last night following some exercise. Session focused on continuation of therex to address trunk stability with manual techniques performed to address noted spasm of Lt piriformis. Noted improvements in pt reported pain and visually noted improvements in hip ROM following this. Ended session without increase in pain and encouraged pt to trial repeated extension movements during the day as needed for pain management. She verbalized understanding at this time.    Rehab Potential Good   Clinical Impairments Affecting Rehab Potential (+) appears motivated to participate  in skilled PT services, fairly good prior health status; (-) has had back problems in the past, sedentary lifestyle    PT Frequency 2x / week  PT Duration 6 weeks   PT Treatment/Interventions ADLs/Self Care Home Management;Biofeedback;Cryotherapy;Moist Heat;Gait training;Stair training;Functional mobility training;Therapeutic activities;Therapeutic exercise;Balance training;Neuromuscular re-education;Patient/family education;Manual techniques;Passive range of motion;Dry needling;Energy conservation;Taping   PT Next Visit Plan continue with trunk/hip stabilization exercises possibly in sidelying or quadruped if symptoms allow.    PT Home Exercise Plan piriformis stretch avoiding lumbar flexion, sciatic flossing; repeated extension in prone and standing    Consulted and Agree with Plan of Care Patient      Patient will benefit from skilled therapeutic intervention in order to improve the following deficits and impairments:  Improper body mechanics, Pain, Decreased coordination, Decreased mobility, Increased muscle spasms, Postural dysfunction, Decreased activity tolerance, Decreased range of motion, Decreased strength, Hypomobility, Impaired perceived functional ability, Difficulty walking, Impaired flexibility  Visit Diagnosis: Acute left-sided low back pain with left-sided sciatica  Muscle weakness (generalized)  Difficulty in walking, not elsewhere classified  Other symptoms and signs involving the musculoskeletal system     Problem List Patient Active Problem List   Diagnosis Date Noted  . Leg weakness 07/08/2014  . Difficulty walking 07/08/2014  . Vaginal dryness   . 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 Raynaud disease   . Fibromyalgia   . Inflammatory arthritis   . Osteopenia     4:47 PM,01/11/17 Elly Modena PT, DPT [facility] [address] [place], [place], [address] Phone:  [phone]   Fax:  [phone]  Name: Monica [name] MRN: [id number] Date of Birth: 08-01-1956

## 2017-01-13 ENCOUNTER — Ambulatory Visit (HOSPITAL_COMMUNITY): Payer: 59 | Admitting: Physical Therapy

## 2017-01-13 DIAGNOSIS — I10 Essential (primary) hypertension: Secondary | ICD-10-CM | POA: Insufficient documentation

## 2017-01-13 DIAGNOSIS — R29898 Other symptoms and signs involving the musculoskeletal system: Secondary | ICD-10-CM

## 2017-01-13 DIAGNOSIS — M5442 Lumbago with sciatica, left side: Secondary | ICD-10-CM | POA: Diagnosis not present

## 2017-01-13 DIAGNOSIS — Z8669 Personal history of other diseases of the nervous system and sense organs: Secondary | ICD-10-CM | POA: Insufficient documentation

## 2017-01-13 DIAGNOSIS — Z79899 Other long term (current) drug therapy: Secondary | ICD-10-CM | POA: Insufficient documentation

## 2017-01-13 DIAGNOSIS — M6281 Muscle weakness (generalized): Secondary | ICD-10-CM

## 2017-01-13 DIAGNOSIS — H3530 Unspecified macular degeneration: Secondary | ICD-10-CM | POA: Insufficient documentation

## 2017-01-13 DIAGNOSIS — M47816 Spondylosis without myelopathy or radiculopathy, lumbar region: Secondary | ICD-10-CM | POA: Insufficient documentation

## 2017-01-13 DIAGNOSIS — R262 Difficulty in walking, not elsewhere classified: Secondary | ICD-10-CM

## 2017-01-13 NOTE — Therapy (Signed)
[place], [place], [address] Phone: [phone]   Fax:  [phone]  Physical Therapy Treatment/Reassessment and G-Codes  Patient Details  Name: JAICE LAGUE MRN: [id number] Date of Birth: 07/21/1956 Referring Provider: Magnus Sinning   Encounter Date: 01/13/2017      PT End of Session - 01/13/17 1830    Visit Number 5   Number of Visits 13   Date for PT Re-Evaluation 01/13/17   Authorization Type [employer]    Authorization Time Period 12/23/16 to 02/03/17   Authorization - Visit Number 5   Authorization - Number of Visits 10   PT Start Time 6712   PT Stop Time 4580   PT Time Calculation (min) 43 min   Activity Tolerance Patient tolerated treatment well;No increased pain   Behavior During Therapy WFL for tasks assessed/performed      Past Medical History:  Diagnosis Date  . Arthritis   . Fibromyalgia   . Macular degeneration    Left eye  . Osteopenia 12/2014   T score -2.1 FRAX 8.6%/1.1%  . Raynaud disease   . Reiters syndrome   . Sleep disturbance   . Vaginal dryness     Past Surgical History:  Procedure Laterality Date  . BREAST SURGERY     LEFT BR LUMP-Benign    There were no vitals filed for this visit.      Subjective Assessment - 01/13/17 1603    Subjective Pt reports that she has been pretty good since her last session. Whatever we did seemed to help alot and none of the pain seemed to wake her up. She feels that she is ~50% improved overall since beginning PT.     Pertinent History Raynauds, Reiters syndrome, fibromyalgia, OA, osteopenia    How long can you sit comfortably? Pt feels this is not limited as long as she gets up to stretch or move around    How long can you stand comfortably? Pt feels this is much better, can stand longer than she can sit    Patient Stated Goals reduce pain, return to PLOF, be able to go on a long trip in June    Currently in Pain? No/denies   Pain Onset More than a month ago            [facility] PT Assessment - 01/13/17 0001      Assessment   Medical Diagnosis back pain    Referring Provider Magnus Sinning    Onset Date/Surgical Date --  February 2017   Next MD Visit Only if needed   Prior Therapy none      Precautions   Precaution Comments caution with heavy lifting, no quick twisting      Balance Screen   Has the patient fallen in the past 6 months No   Has the patient had a decrease in activity level because of a fear of falling?  No   Is the patient reluctant to leave their home because of a fear of falling?  No     Prior Function   Level of Independence Independent;Independent with basic ADLs;Independent with gait;Independent with transfers   Vocation Full time employment   Higher education careers adviser for [employer]      Observation/Other Assessments   Focus on Therapeutic Outcomes (FOTO)  30% limited      AROM   Lumbar Flexion WFL; RFIS exacerbated symptoms    Lumbar Extension moderate limitation; REIS no change  in radicular symptoms    Lumbar - Right Side Bend WFL    Lumbar - Left Side Bend WFL    Lumbar - Right Rotation [facility]    Lumbar - Left Rotation mild limitation      Strength   Right Hip Flexion 4+/5   Right Hip Extension 3+/5   Right Hip ABduction 4+/5   Left Hip Flexion 4+/5   Left Hip Extension 3/5   Left Hip ABduction 3+/5   Right Knee Flexion 4+/5   Right Knee Extension 5/5   Left Knee Flexion 4-/5   Left Knee Extension 4+/5   Right Ankle Dorsiflexion 5/5   Left Ankle Dorsiflexion 4+/5                     [facility] Adult PT Treatment/Exercise - 01/13/17 0001      Exercises   Exercises Other Exercises   Other Exercises  ankle 4-way on the Lt x5 reps each with blue TB for HEP demo                PT Education - 01/13/17 [place]    Education provided Yes   Education Details noted improvements in strength, activity tolerance; importance of pt  allowing herself to pace daily activity/chores; discussed updated to HEP for improved ankle strengthening.    Person(s) Educated Patient   Methods Explanation;Demonstration;Handout;Verbal cues   Comprehension Verbalized understanding;Returned demonstration          PT Short Term Goals - 12/23/16 1751      PT SHORT TERM GOAL #1   Title Patient to demonstrate resolution of L hamstring/piriformis impairment as well as lumbar ROM being WFL on all planes with no pain exacerbation in order to show improvement of condition    Time 3   Period Weeks   Status New     PT SHORT TERM GOAL #2   Title Patient to report that her radicular symptoms only reach her L knee in order to show improvement of condition    Time 3   Period Weeks   Status New     PT SHORT TERM GOAL #3   Title Patient to report she has been able to sleep through the night without waking due to pain in order to improve QOL    Time 3   Period Weeks   Status New     PT SHORT TERM GOAL #4   Title Patient to be independent in correctly and consistently performing targeted HEP, to be updated as appropriate    Time 1   Period Weeks   Status New           PT Long Term Goals - 12/23/16 1753      PT LONG TERM GOAL #1   Title Patient to demonstrate functional strength 5/5 in all tested muscle groups in order to improve function and reduce pain    Time 6   Period Weeks   Status New     PT LONG TERM GOAL #2   Title Patient to report resolution of radicular symptoms in order to show overall improvement of condition and improve functional task tolerance    Time 6   Period Weeks   Status New     PT LONG TERM GOAL #3   Title Patient to experience lumbar pain no more than 1/10 in order to improve QOL and improve functional task tolerance    Time 6   Period Weeks   Status New  PT LONG TERM GOAL #4   Title Patient to be participatory in regular aerobic exercise program, at least 3 days per week and 20 minutes in  duration, in order to maintain functional gains and assist in preventing recurrence of symptoms    Time 6   Period Weeks   Status New               Plan - 2017/02/09 1831    Clinical Impression Statement Pt continues to make steady progress with decreased overall pain report and improved activity tolerance since beginning her PT. She continues to perform her HEP as instructed and reports that she has been having a good past couple of days following the manual techniques performed at her last session. She does continue to present with limitations in BLE strength, notably greater on the Lt. This is contributing to her decreased activity tolerance overall. Pt reporting increased ankle instability with activity at times, so therapist added ankle therex to address this and reduce risk of falls. She was able to demonstrate proper technique by the end of the session. Will continue with current POC.   Rehab Potential Good   Clinical Impairments Affecting Rehab Potential (+) appears motivated to participate in skilled PT services, fairly good prior health status; (-) has had back problems in the past, sedentary lifestyle    PT Frequency 2x / week   PT Duration 6 weeks   PT Treatment/Interventions ADLs/Self Care Home Management;Biofeedback;Cryotherapy;Moist Heat;Gait training;Stair training;Functional mobility training;Therapeutic activities;Therapeutic exercise;Balance training;Neuromuscular re-education;Patient/family education;Manual techniques;Passive range of motion;Dry needling;Energy conservation;Taping   PT Next Visit Plan follow up with ankle 4-way technique; continue with trunk stability and hip strengthening Lt>Rt   PT Home Exercise Plan piriformis stretch avoiding lumbar flexion, sciatic flossing; repeated extension in prone and standing, ankle 4-way with blue band     Consulted and Agree with Plan of Care Patient      Patient will benefit from skilled therapeutic intervention in order to  improve the following deficits and impairments:  Improper body mechanics, Pain, Decreased coordination, Decreased mobility, Increased muscle spasms, Postural dysfunction, Decreased activity tolerance, Decreased range of motion, Decreased strength, Hypomobility, Impaired perceived functional ability, Difficulty walking, Impaired flexibility  Visit Diagnosis: Acute left-sided low back pain with left-sided sciatica  Muscle weakness (generalized)  Difficulty in walking, not elsewhere classified  Other symptoms and signs involving the musculoskeletal system       G-Codes - 09-Feb-2017 1835    Functional Assessment Tool Used (Outpatient Only) Based on skilled clinical assessment of strength, and activity tolerance   Functional Limitation Mobility: Walking and moving around   Mobility: Walking and Moving Around Current Status (P8099) At least 20 percent but less than 40 percent impaired, limited or restricted   Mobility: Walking and Moving Around Goal Status [phone]) At least 1 percent but less than 20 percent impaired, limited or restricted      Problem List Patient Active Problem List   Diagnosis Date Noted  . Leg weakness 07/08/2014  . Difficulty walking 07/08/2014  . Vaginal dryness   . Reiter's syndrome ([place])   . Raynaud disease   . Fibromyalgia   . Inflammatory arthritis   . Osteopenia    6:38 PM,02/09/17 Elly Modena PT, DPT [facility] [place] [address] [place], [place], [address] Phone: [phone]   Fax:  [phone]  Name: AMAURI MEDELLIN MRN: [id number] Date of Birth: 1956-01-10

## 2017-01-17 ENCOUNTER — Ambulatory Visit (INDEPENDENT_AMBULATORY_CARE_PROVIDER_SITE_OTHER): Payer: 59 | Admitting: Rheumatology

## 2017-01-17 ENCOUNTER — Encounter: Payer: Self-pay | Admitting: Rheumatology

## 2017-01-17 VITALS — BP 118/72 | HR 72 | Resp 14 | Wt 137.0 lb

## 2017-01-17 DIAGNOSIS — M8580 Other specified disorders of bone density and structure, unspecified site: Secondary | ICD-10-CM | POA: Diagnosis not present

## 2017-01-17 DIAGNOSIS — M1990 Unspecified osteoarthritis, unspecified site: Secondary | ICD-10-CM

## 2017-01-17 DIAGNOSIS — M47816 Spondylosis without myelopathy or radiculopathy, lumbar region: Secondary | ICD-10-CM

## 2017-01-17 DIAGNOSIS — M797 Fibromyalgia: Secondary | ICD-10-CM | POA: Diagnosis not present

## 2017-01-17 DIAGNOSIS — I7300 Raynaud's syndrome without gangrene: Secondary | ICD-10-CM | POA: Diagnosis not present

## 2017-01-17 DIAGNOSIS — Z79899 Other long term (current) drug therapy: Secondary | ICD-10-CM

## 2017-01-17 DIAGNOSIS — I10 Essential (primary) hypertension: Secondary | ICD-10-CM

## 2017-01-17 DIAGNOSIS — H3530 Unspecified macular degeneration: Secondary | ICD-10-CM

## 2017-01-17 DIAGNOSIS — Z8669 Personal history of other diseases of the nervous system and sense organs: Secondary | ICD-10-CM

## 2017-01-17 NOTE — Progress Notes (Signed)
Rheumatology Medication Review by a Pharmacist Does the patient feel that his/her medications are working for him/her?  Yes Has the patient been experiencing any side effects to the medications prescribed?  No Does the patient have any problems obtaining medications?  No  Issues to address at subsequent visits: None   Pharmacist comments:  Monica [name] is a pleasant 61 yo F who presents for follow up of inflammatory arthritis.  She is currently taking sulfasalazine 1000 mg BID.  She is due for standing labs today.  She prefers to get standing labs at [facility].  Will place standing orders for [facility].  Patient denies any questions or concerns regarding her medications at this time.    Elisabeth Most, Pharm.D., BCPS, CPP Clinical Pharmacist Pager: [phone] Phone: [phone] 01/17/2017 11:46 AM

## 2017-01-18 ENCOUNTER — Ambulatory Visit (HOSPITAL_COMMUNITY): Payer: 59 | Admitting: Physical Therapy

## 2017-01-18 DIAGNOSIS — M6281 Muscle weakness (generalized): Secondary | ICD-10-CM

## 2017-01-18 DIAGNOSIS — R29898 Other symptoms and signs involving the musculoskeletal system: Secondary | ICD-10-CM

## 2017-01-18 DIAGNOSIS — M5442 Lumbago with sciatica, left side: Secondary | ICD-10-CM

## 2017-01-18 DIAGNOSIS — R262 Difficulty in walking, not elsewhere classified: Secondary | ICD-10-CM

## 2017-01-18 LAB — CMP14+EGFR
ALK PHOS: 81 IU/L (ref 39–117)
ALT: 28 IU/L (ref 0–32)
AST: 32 IU/L (ref 0–40)
Albumin/Globulin Ratio: 2 (ref 1.2–2.2)
Albumin: 4.3 g/dL (ref 3.6–4.8)
BUN/Creatinine Ratio: 11 — ABNORMAL LOW (ref 12–28)
BUN: 9 mg/dL (ref 8–27)
Bilirubin Total: <0.2 mg/dL (ref 0.0–1.2)
CO2: 27 mmol/L (ref 18–29)
CREATININE: 0.83 mg/dL (ref 0.57–1.00)
Calcium: 9.2 mg/dL (ref 8.7–10.3)
Chloride: 101 mmol/L (ref 96–106)
GFR calc Af Amer: 89 mL/min/{1.73_m2} (ref >59)
GFR calc non Af Amer: 77 mL/min/{1.73_m2} (ref >59)
GLUCOSE: 116 mg/dL — AB (ref 65–99)
Globulin, Total: 2.2 g/dL (ref 1.5–4.5)
Potassium: 4 mmol/L (ref 3.5–5.2)
Sodium: 142 mmol/L (ref 134–144)
Total Protein: 6.5 g/dL (ref 6.0–8.5)

## 2017-01-18 LAB — CBC WITH DIFFERENTIAL/PLATELET
Basophils Absolute: 0 10*3/uL (ref 0.0–0.2)
Basos: 0 %
EOS (ABSOLUTE): 0 10*3/uL (ref 0.0–0.4)
Eos: 0 %
Hematocrit: 41.7 % (ref 34.0–46.6)
Hemoglobin: 13.5 g/dL (ref 11.1–15.9)
Immature Grans (Abs): 0 10*3/uL (ref 0.0–0.1)
Immature Granulocytes: 0 %
LYMPHS ABS: 1.8 10*3/uL (ref 0.7–3.1)
Lymphs: 35 %
MCH: 29.7 pg (ref 26.6–33.0)
MCHC: 32.4 g/dL (ref 31.5–35.7)
MCV: 92 fL (ref 79–97)
MONOCYTES: 8 %
MONOS ABS: 0.4 10*3/uL (ref 0.1–0.9)
Neutrophils Absolute: 2.9 10*3/uL (ref 1.4–7.0)
Neutrophils: 57 %
PLATELETS: 300 10*3/uL (ref 150–379)
RBC: 4.55 x10E6/uL (ref 3.77–5.28)
RDW: 13.8 % (ref 12.3–15.4)
WBC: 5.2 10*3/uL (ref 3.4–10.8)

## 2017-01-18 NOTE — Progress Notes (Signed)
wnl

## 2017-01-18 NOTE — Therapy (Signed)
[place], [place], [address] Phone: [phone]   Fax:  [phone]  Physical Therapy Treatment  Patient Details  Name: Monica [name] MRN: [id number] Date of Birth: [birth year] Referring Provider: Magnus Sinning   Encounter Date: 01/18/2017      PT End of Session - 01/18/17 1613    Visit Number 6   Number of Visits 13   Date for PT Re-Evaluation 01/13/17   Authorization Type [employer]    Authorization Time Period 12/23/16 to 02/03/17   Authorization - Visit Number 6   Authorization - Number of Visits 10   PT Start Time 1829   PT Stop Time 9371   PT Time Calculation (min) 45 min   Activity Tolerance Patient tolerated treatment well;No increased pain   Behavior During Therapy WFL for tasks assessed/performed      Past Medical History:  Diagnosis Date  . Arthritis   . Fibromyalgia   . Macular degeneration    Left eye  . Osteopenia 12/2014   T score -2.1 FRAX 8.6%/1.1%  . Raynaud disease   . Reiters syndrome   . Sleep disturbance   . Vaginal dryness     Past Surgical History:  Procedure Laterality Date  . BREAST SURGERY     LEFT BR LUMP-Benign    There were no vitals filed for this visit.      Subjective Assessment - 01/18/17 1610    Subjective Pt states she is not having any pain currently and doing her HEP. States it was nagging all day long yesterday and thinks it was due to the way she slept.   Currently in Pain? No/denies                         [facility] Adult PT Treatment/Exercise - 01/18/17 0001      Lumbar Exercises: Stretches   Piriformis Stretch 2 reps;30 seconds   Piriformis Stretch Limitations seated      Lumbar Exercises: Supine   Bent Knee Raise 20 reps   Bent Knee Raise Limitations red TB around knees, ball squeeze between ankles with UE rotations using small weighted ball   Bridge 15 reps   Bridge Limitations x1 set both, 1 set of singles      Manual Therapy   Manual Therapy Myofascial release   Manual therapy comments separate rest of session   Myofascial Release Lt piriformis                   PT Short Term Goals - 12/23/16 1751      PT SHORT TERM GOAL #1   Title Patient to demonstrate resolution of L hamstring/piriformis impairment as well as lumbar ROM being WFL on all planes with no pain exacerbation in order to show improvement of condition    Time 3   Period Weeks   Status New     PT SHORT TERM GOAL #2   Title Patient to report that her radicular symptoms only reach her L knee in order to show improvement of condition    Time 3   Period Weeks   Status New     PT SHORT TERM GOAL #3   Title Patient to report she has been able to sleep through the night without waking due to pain in order to improve QOL    Time 3   Period Weeks   Status New     PT SHORT TERM  GOAL #4   Title Patient to be independent in correctly and consistently performing targeted HEP, to be updated as appropriate    Time 1   Period Weeks   Status New           PT Long Term Goals - 12/23/16 1753      PT LONG TERM GOAL #1   Title Patient to demonstrate functional strength 5/5 in all tested muscle groups in order to improve function and reduce pain    Time 6   Period Weeks   Status New     PT LONG TERM GOAL #2   Title Patient to report resolution of radicular symptoms in order to show overall improvement of condition and improve functional task tolerance    Time 6   Period Weeks   Status New     PT LONG TERM GOAL #3   Title Patient to experience lumbar pain no more than 1/10 in order to improve QOL and improve functional task tolerance    Time 6   Period Weeks   Status New     PT LONG TERM GOAL #4   Title Patient to be participatory in regular aerobic exercise program, at least 3 days per week and 20 minutes in duration, in order to maintain functional gains and assist in preventing recurrence of symptoms    Time 6    Period Weeks   Status New               Plan - 01/18/17 1614    Clinical Impression Statement Continued progress and adherence to HEP.  Progressed to active hip abduction against the wall.  Added additional core stabiltiy and hip abductor strengthening in side lying.  Also shown 4 way ankle strengthening with theraband in seated position.  Manual completed to LT piriformis, however with decreased tightness, tenderness and discomfort.  Instructed with ITB stretch  in sidelying as noted tightness in this area as well.  Pt without pain voiced at end of session.     Rehab Potential Good   Clinical Impairments Affecting Rehab Potential (+) appears motivated to participate in skilled PT services, fairly good prior health status; (-) has had back problems in the past, sedentary lifestyle    PT Frequency 2x / week   PT Duration 6 weeks   PT Treatment/Interventions ADLs/Self Care Home Management;Biofeedback;Cryotherapy;Moist Heat;Gait training;Stair training;Functional mobility training;Therapeutic activities;Therapeutic exercise;Balance training;Neuromuscular re-education;Patient/family education;Manual techniques;Passive range of motion;Dry needling;Energy conservation;Taping   PT Next Visit Plan Continue to increase trunk stability and hip strengthening Lt>Rt.  Add addiitional stretches as needed.   PT Home Exercise Plan piriformis stretch avoiding lumbar flexion, sciatic flossing; repeated extension in prone and standing, ankle 4-way with blue band     Consulted and Agree with Plan of Care Patient      Patient will benefit from skilled therapeutic intervention in order to improve the following deficits and impairments:  Improper body mechanics, Pain, Decreased coordination, Decreased mobility, Increased muscle spasms, Postural dysfunction, Decreased activity tolerance, Decreased range of motion, Decreased strength, Hypomobility, Impaired perceived functional ability, Difficulty walking,  Impaired flexibility  Visit Diagnosis: Acute left-sided low back pain with left-sided sciatica  Muscle weakness (generalized)  Difficulty in walking, not elsewhere classified  Other symptoms and signs involving the musculoskeletal system     Problem List Patient Active Problem List   Diagnosis Date Noted  . High risk medication use 01/13/2017  . History of iritis 01/13/2017  . Essential hypertension 01/13/2017  . Macular degeneration  01/13/2017  . 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 Leg weakness 07/08/2014  . Difficulty walking 07/08/2014  . Vaginal dryness   . Reiter's syndrome ([place])   . Raynaud disease   . Fibromyalgia   . Inflammatory arthritis   . Osteopenia     Teena Irani, PTA/CLT [phone]  01/18/2017, 4:53 PM  [facility] [address] [place], [place], [address] Phone: [phone]   Fax:  [phone]  Name: Monica [name] MRN: [id number] Date of Birth: January 26, 1956

## 2017-01-20 ENCOUNTER — Ambulatory Visit (HOSPITAL_COMMUNITY): Payer: 59

## 2017-01-20 DIAGNOSIS — R29898 Other symptoms and signs involving the musculoskeletal system: Secondary | ICD-10-CM

## 2017-01-20 DIAGNOSIS — M5442 Lumbago with sciatica, left side: Secondary | ICD-10-CM | POA: Diagnosis not present

## 2017-01-20 DIAGNOSIS — R262 Difficulty in walking, not elsewhere classified: Secondary | ICD-10-CM

## 2017-01-20 DIAGNOSIS — M6281 Muscle weakness (generalized): Secondary | ICD-10-CM

## 2017-01-20 NOTE — Therapy (Signed)
[place], [place], [address] Phone: [phone]   Fax:  [phone]  Physical Therapy Treatment  Patient Details  Name: Monica [name] MRN: [id number] Date of Birth: [birth year] Referring Provider: Magnus Sinning   Encounter Date: 01/20/2017      PT End of Session - 01/20/17 1620    Visit Number 7   Number of Visits 13   Date for PT Re-Evaluation 01/13/17   Authorization Type [employer]    Authorization Time Period 12/23/16 to 02/03/17   Authorization - Visit Number 7   Authorization - Number of Visits 10   PT Start Time 8937   PT Stop Time 3428   PT Time Calculation (min) 40 min   Activity Tolerance Patient tolerated treatment well;No increased pain   Behavior During Therapy WFL for tasks assessed/performed      Past Medical History:  Diagnosis Date  . Arthritis   . Fibromyalgia   . Macular degeneration    Left eye  . Osteopenia 12/2014   T score -2.1 FRAX 8.6%/1.1%  . Raynaud disease   . Reiters syndrome   . Sleep disturbance   . Vaginal dryness     Past Surgical History:  Procedure Laterality Date  . BREAST SURGERY     LEFT BR LUMP-Benign    There were no vitals filed for this visit.      Subjective Assessment - 01/20/17 1610    Subjective Pt reports she is feeling good today, no reports of pain today.  Reports compliance with HEP including ankle exercises with theraband.   Pertinent History Raynauds, Reiters syndrome, fibromyalgia, OA, osteopenia    Patient Stated Goals reduce pain, return to PLOF, be able to go on a long trip in June    Currently in Pain? No/denies               [facility] Adult PT Treatment/Exercise - 01/20/17 0001      Lumbar Exercises: Stretches   Piriformis Stretch 2 reps;30 seconds   Piriformis Stretch Limitations seated and supine figure 4     Lumbar Exercises: Supine   Bent Knee Raise 20 reps   Bent Knee Raise Limitations 90/90 positon with toe  tapping'   Bridge 15 reps   Bridge Limitations 1 set of singles   Other Supine Lumbar Exercises UE rotation: red TB around knees, ball squeeze between ankles with UE rotations using small weighted ball   Other Supine Lumbar Exercises Trunk rotation with LE 90/90: red TB around knees, ball squeeze between ankles with UE rotations using small weighted ball     Lumbar Exercises: Prone   Opposite Arm/Leg Raise Left arm/Right leg;Right arm/Left leg;10 reps     Manual Therapy   Manual Therapy Myofascial release   Manual therapy comments separate rest of session   Myofascial Release Lt piriformis trigger point release, contract, relax and stretch x 5 reps 2sets                  PT Short Term Goals - 12/23/16 1751      PT SHORT TERM GOAL #1   Title Patient to demonstrate resolution of L hamstring/piriformis impairment as well as lumbar ROM being WFL on all planes with no pain exacerbation in order to show improvement of condition    Time 3   Period Weeks   Status New     PT SHORT TERM GOAL #2   Title Patient to report that her radicular  symptoms only reach her L knee in order to show improvement of condition    Time 3   Period Weeks   Status New     PT SHORT TERM GOAL #3   Title Patient to report she has been able to sleep through the night without waking due to pain in order to improve QOL    Time 3   Period Weeks   Status New     PT SHORT TERM GOAL #4   Title Patient to be independent in correctly and consistently performing targeted HEP, to be updated as appropriate    Time 1   Period Weeks   Status New           PT Long Term Goals - 12/23/16 1753      PT LONG TERM GOAL #1   Title Patient to demonstrate functional strength 5/5 in all tested muscle groups in order to improve function and reduce pain    Time 6   Period Weeks   Status New     PT LONG TERM GOAL #2   Title Patient to report resolution of radicular symptoms in order to show overall improvement  of condition and improve functional task tolerance    Time 6   Period Weeks   Status New     PT LONG TERM GOAL #3   Title Patient to experience lumbar pain no more than 1/10 in order to improve QOL and improve functional task tolerance    Time 6   Period Weeks   Status New     PT LONG TERM GOAL #4   Title Patient to be participatory in regular aerobic exercise program, at least 3 days per week and 20 minutes in duration, in order to maintain functional gains and assist in preventing recurrence of symptoms    Time 6   Period Weeks   Status New               Plan - 01/20/17 1648    Clinical Impression Statement Session focus on trunk and hip stability with stretches and manual techniques to address piriformis tightness.  Progressed intensity with therex core strengthening exercises with noted visible fatigue with task.  Pt able to complete all exercises with good form with min cueing for stability.  Reports of some tingling during session with reports of radicular symptoms resolved following manual trigger point.  No reports of pain through session.     Rehab Potential Good   Clinical Impairments Affecting Rehab Potential (+) appears motivated to participate in skilled PT services, fairly good prior health status; (-) has had back problems in the past, sedentary lifestyle    PT Frequency 2x / week   PT Duration 6 weeks   PT Treatment/Interventions ADLs/Self Care Home Management;Biofeedback;Cryotherapy;Moist Heat;Gait training;Stair training;Functional mobility training;Therapeutic activities;Therapeutic exercise;Balance training;Neuromuscular re-education;Patient/family education;Manual techniques;Passive range of motion;Dry needling;Energy conservation;Taping   PT Next Visit Plan Continue to increase trunk stability and hip strengthening Lt>Rt.  Add addiitional stretches as needed.   PT Home Exercise Plan piriformis stretch avoiding lumbar flexion, sciatic flossing; repeated  extension in prone and standing, ankle 4-way with blue band        Patient will benefit from skilled therapeutic intervention in order to improve the following deficits and impairments:  Improper body mechanics, Pain, Decreased coordination, Decreased mobility, Increased muscle spasms, Postural dysfunction, Decreased activity tolerance, Decreased range of motion, Decreased strength, Hypomobility, Impaired perceived functional ability, Difficulty walking, Impaired flexibility  Visit Diagnosis: Acute left-sided low  back pain with left-sided sciatica  Muscle weakness (generalized)  Difficulty in walking, not elsewhere classified  Other symptoms and signs involving the musculoskeletal system     Problem List Patient Active Problem List   Diagnosis Date Noted  . High risk medication use 01/13/2017  . History of iritis 01/13/2017  . Essential hypertension 01/13/2017  . Macular degeneration 01/13/2017  . 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 Leg weakness 07/08/2014  . Difficulty walking 07/08/2014  . Vaginal dryness   . Reiter's syndrome ([place])   . Raynaud disease   . Fibromyalgia   . Inflammatory arthritis   . Osteopenia    Ihor Austin, LPTA; CBIS [phone]  Aldona Lento 01/20/2017, 5:12 PM  [facility] [address] [place], [place], [address] Phone: [phone]   Fax:  [phone]  Name: ZAYDAH NAWABI MRN: [id number] Date of Birth: [birth year]

## 2017-01-25 ENCOUNTER — Ambulatory Visit (HOSPITAL_COMMUNITY): Payer: 59 | Admitting: Physical Therapy

## 2017-01-25 DIAGNOSIS — M6281 Muscle weakness (generalized): Secondary | ICD-10-CM

## 2017-01-25 DIAGNOSIS — R29898 Other symptoms and signs involving the musculoskeletal system: Secondary | ICD-10-CM

## 2017-01-25 DIAGNOSIS — R262 Difficulty in walking, not elsewhere classified: Secondary | ICD-10-CM

## 2017-01-25 DIAGNOSIS — M5442 Lumbago with sciatica, left side: Secondary | ICD-10-CM | POA: Diagnosis not present

## 2017-01-25 NOTE — Therapy (Signed)
[place], [place], [address] Phone: [phone]   Fax:  [phone]  Physical Therapy Treatment  Patient Details  Name: Monica [name] MRN: [id number] Date of Birth: 1956-02-13 Referring Provider: Magnus Sinning   Encounter Date: 01/25/2017      PT End of Session - 01/25/17 1719    Visit Number 8   Number of Visits 13   Date for PT Re-Evaluation 02/03/17   Authorization Type [employer]    Authorization Time Period 12/23/16 to 02/03/17   Authorization - Visit Number 8   Authorization - Number of Visits 10   PT Start Time 0923   PT Stop Time 1704   PT Time Calculation (min) 40 min   Activity Tolerance Patient tolerated treatment well   Behavior During Therapy [facility] for tasks assessed/performed      Past Medical History:  Diagnosis Date  . Arthritis   . Fibromyalgia   . Macular degeneration    Left eye  . Osteopenia 12/2014   T score -2.1 FRAX 8.6%/1.1%  . Raynaud disease   . Reiters syndrome   . Sleep disturbance   . Vaginal dryness     Past Surgical History:  Procedure Laterality Date  . BREAST SURGERY     LEFT BR LUMP-Benign    There were no vitals filed for this visit.      Subjective Assessment - 01/25/17 1626    Subjective Patient states taht she is having some pain today, about 3/10 but this seems to be because she worked all day and forgot her OA medicine    Pertinent History Raynauds, Reiters syndrome, fibromyalgia, OA, osteopenia    Patient Stated Goals reduce pain, return to PLOF, be able to go on a long trip in June    Currently in Pain? Yes   Pain Score 3    Pain Location Back   Pain Orientation Left   Pain Descriptors / Indicators Aching;Dull   Pain Type Chronic pain   Pain Radiating Towards at times it will radiate down L LE    Pain Onset More than a month ago   Pain Frequency Intermittent   Aggravating Factors  moving wrong, lifting heavy or awkward objects    Pain  Relieving Factors piriformis release    Effect of Pain on Daily Activities moderate impact                          [facility] Adult PT Treatment/Exercise - 01/25/17 0001      Lumbar Exercises: Standing   Other Standing Lumbar Exercises planks against wall 5x10 second holds      Lumbar Exercises: Seated   Hip Flexion on Ball Limitations seated marches on dynadisk 1x10; on flat mat table: side crunches elbow to talbe 1x10      Lumbar Exercises: Supine   Bridge 20 reps   Bridge Limitations singles    Straight Leg Raise 10 reps   Straight Leg Raises Limitations 2# B      Lumbar Exercises: Sidelying   Hip Abduction 10 reps   Hip Abduction Weights (lbs) 2     Lumbar Exercises: Prone   Straight Leg Raise 10 reps   Straight Leg Raises Limitations 2#      Lumbar Exercises: Quadruped   Single Arm Raise 10 reps   Straight Leg Raise 5 reps   Opposite Arm/Leg Raise Right arm/Left leg;Left arm/Right leg   Opposite Arm/Leg Raise  Limitations 3 reps each      Manual Therapy   Manual Therapy Soft tissue mobilization   Manual therapy comments separate rest of session   Myofascial Release ball assisted L piriformis                 PT Education - 01/25/17 1719    Education provided Yes   Education Details possible DOMS    Person(s) Educated Patient   Methods Explanation   Comprehension Verbalized understanding          PT Short Term Goals - 12/23/16 1751      PT SHORT TERM GOAL #1   Title Patient to demonstrate resolution of L hamstring/piriformis impairment as well as lumbar ROM being WFL on all planes with no pain exacerbation in order to show improvement of condition    Time 3   Period Weeks   Status New     PT SHORT TERM GOAL #2   Title Patient to report that her radicular symptoms only reach her L knee in order to show improvement of condition    Time 3   Period Weeks   Status New     PT SHORT TERM GOAL #3   Title Patient to report she has been  able to sleep through the night without waking due to pain in order to improve QOL    Time 3   Period Weeks   Status New     PT SHORT TERM GOAL #4   Title Patient to be independent in correctly and consistently performing targeted HEP, to be updated as appropriate    Time 1   Period Weeks   Status New           PT Long Term Goals - 12/23/16 1753      PT LONG TERM GOAL #1   Title Patient to demonstrate functional strength 5/5 in all tested muscle groups in order to improve function and reduce pain    Time 6   Period Weeks   Status New     PT LONG TERM GOAL #2   Title Patient to report resolution of radicular symptoms in order to show overall improvement of condition and improve functional task tolerance    Time 6   Period Weeks   Status New     PT LONG TERM GOAL #3   Title Patient to experience lumbar pain no more than 1/10 in order to improve QOL and improve functional task tolerance    Time 6   Period Weeks   Status New     PT LONG TERM GOAL #4   Title Patient to be participatory in regular aerobic exercise program, at least 3 days per week and 20 minutes in duration, in order to maintain functional gains and assist in preventing recurrence of symptoms    Time 6   Period Weeks   Status New               Plan - 01/25/17 1720    Clinical Impression Statement Continued manual/release of L piriformis muscle due to patient reported relief with this intervention; then progressed to core and hip strengthening as indicated in POC at this time. Progressed reps and difficulty of all interventions today with good tolerance by patient, cues for form provided PRN. Patient appears to continue to do well with skilled PT services at this point.    Rehab Potential Good   Clinical Impairments Affecting Rehab Potential (+) appears motivated to participate in skilled  PT services, fairly good prior health status; (-) has had back problems in the past, sedentary lifestyle    PT  Frequency 2x / week   PT Duration 6 weeks   PT Treatment/Interventions ADLs/Self Care Home Management;Biofeedback;Cryotherapy;Moist Heat;Gait training;Stair training;Functional mobility training;Therapeutic activities;Therapeutic exercise;Balance training;Neuromuscular re-education;Patient/family education;Manual techniques;Passive range of motion;Dry needling;Energy conservation;Taping   PT Next Visit Plan continue progressing core and trunk stability/hip strength. Continue manual/stretching as needed.    PT Home Exercise Plan piriformis stretch avoiding lumbar flexion, sciatic flossing; repeated extension in prone and standing, ankle 4-way with blue band     Consulted and Agree with Plan of Care Patient      Patient will benefit from skilled therapeutic intervention in order to improve the following deficits and impairments:  Improper body mechanics, Pain, Decreased coordination, Decreased mobility, Increased muscle spasms, Postural dysfunction, Decreased activity tolerance, Decreased range of motion, Decreased strength, Hypomobility, Impaired perceived functional ability, Difficulty walking, Impaired flexibility  Visit Diagnosis: Acute left-sided low back pain with left-sided sciatica  Muscle weakness (generalized)  Difficulty in walking, not elsewhere classified  Other symptoms and signs involving the musculoskeletal system     Problem List Patient Active Problem List   Diagnosis Date Noted  . High risk medication use 01/13/2017  . History of iritis 01/13/2017  . Essential hypertension 01/13/2017  . Macular degeneration 01/13/2017  . 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 Leg weakness 07/08/2014  . Difficulty walking 07/08/2014  . Vaginal dryness   . Reiter's syndrome ([place])   . Raynaud disease   . Fibromyalgia   . Inflammatory arthritis   . Osteopenia     Deniece Ree PT, DPT [facility] [address] [place], [place], [address] Phone: [phone]   Fax:  [phone]  Name: VERLENA MARLETTE MRN: [id number] Date of Birth: 1956/01/26

## 2017-01-27 ENCOUNTER — Ambulatory Visit (HOSPITAL_COMMUNITY): Payer: 59

## 2017-01-27 DIAGNOSIS — M6281 Muscle weakness (generalized): Secondary | ICD-10-CM

## 2017-01-27 DIAGNOSIS — M5442 Lumbago with sciatica, left side: Secondary | ICD-10-CM | POA: Diagnosis not present

## 2017-01-27 DIAGNOSIS — R29898 Other symptoms and signs involving the musculoskeletal system: Secondary | ICD-10-CM

## 2017-01-27 DIAGNOSIS — R262 Difficulty in walking, not elsewhere classified: Secondary | ICD-10-CM

## 2017-01-27 NOTE — Therapy (Signed)
[place], [place], [address] Phone: [phone]   Fax:  [phone]  Physical Therapy Treatment  Patient Details  Name: Monica [name] MRN: [id number] Date of Birth: 10/19/1955 Referring Provider: Magnus Sinning   Encounter Date: 01/27/2017      PT End of Session - 01/27/17 1611    Visit Number 9   Number of Visits 13   Date for PT Re-Evaluation 02/03/17   Authorization Type [employer]    Authorization Time Period 12/23/16 to 02/03/17   Authorization - Visit Number 9   Authorization - Number of Visits 10   PT Start Time 2263   PT Stop Time 3354   PT Time Calculation (min) 38 min   Activity Tolerance Patient tolerated treatment well;No increased pain   Behavior During Therapy WFL for tasks assessed/performed      Past Medical History:  Diagnosis Date  . Arthritis   . Fibromyalgia   . Macular degeneration    Left eye  . Osteopenia 12/2014   T score -2.1 FRAX 8.6%/1.1%  . Raynaud disease   . Reiters syndrome   . Sleep disturbance   . Vaginal dryness     Past Surgical History:  Procedure Laterality Date  . BREAST SURGERY     LEFT BR LUMP-Benign    There were no vitals filed for this visit.      Subjective Assessment - 01/27/17 1605    Subjective Pt stated she is feeling good today, no reports of pain today.  Reports most difficulty with house cleaning partically cleaning bathtub, bending up and down.     Patient Stated Goals reduce pain, return to PLOF, be able to go on a long trip in June    Currently in Pain? No/denies              [facility] Adult PT Treatment/Exercise - 01/27/17 0001      Lumbar Exercises: Stretches   Quadruped Mid Back Stretch 2 reps;30 seconds   Quadruped Mid Back Stretch Limitations child's pose   Piriformis Stretch 2 reps;30 seconds   Piriformis Stretch Limitations supine figure 4     Lumbar Exercises: Standing   Functional Squats 10 reps   Functional  Squats Limitations good mechanics following initial cueing   Other Standing Lumbar Exercises isometric abduction 15x 3" against wall     Lumbar Exercises: Supine   Bridge 20 reps   Bridge Limitations singles    Other Supine Lumbar Exercises Trunk rotation with LE 90/90: red TB around knees, ball squeeze between ankles with UE rotations using small weighted ball     Lumbar Exercises: Sidelying   Hip Abduction 15 reps   Hip Abduction Weights (lbs) 2     Lumbar Exercises: Prone   Opposite Arm/Leg Raise Left arm/Right leg;Right arm/Left leg;10 reps;5 seconds  resumed prone following c/o wrist pain; increased hold time      Lumbar Exercises: Quadruped   Opposite Arm/Leg Raise Right arm/Left leg;Left arm/Right leg   Opposite Arm/Leg Raise Limitations reports of wrist pain, resumed prone exercises     Manual Therapy   Manual Therapy Myofascial release   Manual therapy comments separate rest of session   Myofascial Release Lt piriformis with PROM to improve mobilty                  PT Short Term Goals - 12/23/16 1751      PT SHORT TERM GOAL #1   Title Patient to  demonstrate resolution of L hamstring/piriformis impairment as well as lumbar ROM being WFL on all planes with no pain exacerbation in order to show improvement of condition    Time 3   Period Weeks   Status New     PT SHORT TERM GOAL #2   Title Patient to report that her radicular symptoms only reach her L knee in order to show improvement of condition    Time 3   Period Weeks   Status New     PT SHORT TERM GOAL #3   Title Patient to report she has been able to sleep through the night without waking due to pain in order to improve QOL    Time 3   Period Weeks   Status New     PT SHORT TERM GOAL #4   Title Patient to be independent in correctly and consistently performing targeted HEP, to be updated as appropriate    Time 1   Period Weeks   Status New           PT Long Term Goals - 12/23/16 1753       PT LONG TERM GOAL #1   Title Patient to demonstrate functional strength 5/5 in all tested muscle groups in order to improve function and reduce pain    Time 6   Period Weeks   Status New     PT LONG TERM GOAL #2   Title Patient to report resolution of radicular symptoms in order to show overall improvement of condition and improve functional task tolerance    Time 6   Period Weeks   Status New     PT LONG TERM GOAL #3   Title Patient to experience lumbar pain no more than 1/10 in order to improve QOL and improve functional task tolerance    Time 6   Period Weeks   Status New     PT LONG TERM GOAL #4   Title Patient to be participatory in regular aerobic exercise program, at least 3 days per week and 20 minutes in duration, in order to maintain functional gains and assist in preventing recurrence of symptoms    Time 6   Period Weeks   Status New               Plan - 01/27/17 1611    Clinical Impression Statement Pt reports compliance with HEP and pain free at entrance.  Reviewed functional difficulties with reports of increased difficulty with cleaning house.  Added functional squats as progressing to standing functional activities and gluteal strengthening.  Pt able to participate with appropriate form following initial introduction.  Continued with trunk and hip strengthening exercises with ability to increase reps with noted fatigue from weak abdominal/gluteal musculature.  Pt able to complete all exercises with no reports of pain.  EOS with manual trigger point release to Lt piriformis with ability to IR/ER Lt hip with no pain and reports of decreased tingling with IR/ER of Lt hip.     Rehab Potential Good   Clinical Impairments Affecting Rehab Potential (+) appears motivated to participate in skilled PT services, fairly good prior health status; (-) has had back problems in the past, sedentary lifestyle    PT Frequency 2x / week   PT Duration 6 weeks   PT  Treatment/Interventions ADLs/Self Care Home Management;Biofeedback;Cryotherapy;Moist Heat;Gait training;Stair training;Functional mobility training;Therapeutic activities;Therapeutic exercise;Balance training;Neuromuscular re-education;Patient/family education;Manual techniques;Passive range of motion;Dry needling;Energy conservation;Taping   PT Next Visit Plan continue progressing core and  trunk stability/hip strength. Continue manual/stretching as needed. Progress to standing functional activities to improve confidence and reduce fear of certain movements.   PT Home Exercise Plan piriformis stretch avoiding lumbar flexion, sciatic flossing; repeated extension in prone and standing, ankle 4-way with blue band        Patient will benefit from skilled therapeutic intervention in order to improve the following deficits and impairments:  Improper body mechanics, Pain, Decreased coordination, Decreased mobility, Increased muscle spasms, Postural dysfunction, Decreased activity tolerance, Decreased range of motion, Decreased strength, Hypomobility, Impaired perceived functional ability, Difficulty walking, Impaired flexibility  Visit Diagnosis: Acute left-sided low back pain with left-sided sciatica  Muscle weakness (generalized)  Difficulty in walking, not elsewhere classified  Other symptoms and signs involving the musculoskeletal system     Problem List Patient Active Problem List   Diagnosis Date Noted  . High risk medication use 01/13/2017  . History of iritis 01/13/2017  . Essential hypertension 01/13/2017  . Macular degeneration 01/13/2017  . 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 Leg weakness 07/08/2014  . Difficulty walking 07/08/2014  . Vaginal dryness   . Reiter's syndrome ([place])   . Raynaud disease   . Fibromyalgia   . Inflammatory arthritis   . Osteopenia    Ihor Austin, LPTA; [facility]  Aldona Lento 01/27/2017,  5:33 PM  [place] [address] [place], [place], [address] Phone: [phone]   Fax:  [phone]  Name: Monica [name] MRN: [id number] Date of Birth: Nov 24, 1955

## 2017-02-01 ENCOUNTER — Ambulatory Visit (HOSPITAL_COMMUNITY): Payer: 59 | Admitting: Physical Therapy

## 2017-02-01 DIAGNOSIS — R29898 Other symptoms and signs involving the musculoskeletal system: Secondary | ICD-10-CM

## 2017-02-01 DIAGNOSIS — M5442 Lumbago with sciatica, left side: Secondary | ICD-10-CM

## 2017-02-01 DIAGNOSIS — R262 Difficulty in walking, not elsewhere classified: Secondary | ICD-10-CM

## 2017-02-01 DIAGNOSIS — M6281 Muscle weakness (generalized): Secondary | ICD-10-CM

## 2017-02-01 NOTE — Therapy (Signed)
[place], [place], [address] Phone: [phone]   Fax:  [phone]  Physical Therapy Treatment  Patient Details  Name: Monica [name] MRN: [id number] Date of Birth: 1956-09-10 Referring Provider: Magnus Sinning  Encounter Date: 02/01/2017      PT End of Session - 02/01/17 1632    Visit Number 10   Number of Visits 13   Date for PT Re-Evaluation 02/03/17   Authorization Type [employer]    Authorization Time Period 12/23/16 to 02/03/17   Authorization - Visit Number 10   Authorization - Number of Visits 10   PT Start Time 1600   PT Stop Time 6962   PT Time Calculation (min) 43 min   Activity Tolerance Patient tolerated treatment well;No increased pain   Behavior During Therapy WFL for tasks assessed/performed      Past Medical History:  Diagnosis Date  . Arthritis   . Fibromyalgia   . Macular degeneration    Left eye  . Osteopenia 12/2014   T score -2.1 FRAX 8.6%/1.1%  . Raynaud disease   . Reiters syndrome   . Sleep disturbance   . Vaginal dryness     Past Surgical History:  Procedure Laterality Date  . BREAST SURGERY     LEFT BR LUMP-Benign    There were no vitals filed for this visit.      Subjective Assessment - 02/01/17 1602    Subjective Pt reports that she was very sore after her last session for about 1-2 days. She is doing ok today however, just tired. She tried to take things easy just in case she was having a flare-up of RA.    Patient Stated Goals reduce pain, return to PLOF, be able to go on a long trip in June    Currently in Pain? No/denies                         [facility] Adult PT Treatment/Exercise - 02/01/17 0001      Self-Care   Self-Care ADL's   ADL's Lifting small box from chair height x10 reps with proper demonstration of technique. Addition of 8# x5 reps, addition of another 8# x5 reps.     Lumbar Exercises: Standing   Other Standing Lumbar  Exercises tandem hold with trunk rotation Lt and Rt 2x10 each direction with each LE forward    Other Standing Lumbar Exercises squats in // bars 2x10 reps      Manual Therapy   Manual Therapy Myofascial release   Manual therapy comments separate rest of session   Myofascial Release Lt piriformis trigger point release in addition to contract/relax x5 reps                 PT Education - 02/01/17 1651    Education provided Yes   Education Details lifting mechanics; encouraged pt to begin daily walking program to build up stamina and endurance for upcoming trip with her family in June; discussed some goals    Person(s) Educated Patient   Methods Explanation;Demonstration;Verbal cues   Comprehension Verbalized understanding;Returned demonstration          PT Short Term Goals - 12/23/16 1751      PT SHORT TERM GOAL #1   Title Patient to demonstrate resolution of L hamstring/piriformis impairment as well as lumbar ROM being WFL on all planes with no pain exacerbation in order to show improvement of condition  Time 3   Period Weeks   Status New     PT SHORT TERM GOAL #2   Title Patient to report that her radicular symptoms only reach her L knee in order to show improvement of condition    Time 3   Period Weeks   Status New     PT SHORT TERM GOAL #3   Title Patient to report she has been able to sleep through the night without waking due to pain in order to improve QOL    Time 3   Period Weeks   Status New     PT SHORT TERM GOAL #4   Title Patient to be independent in correctly and consistently performing targeted HEP, to be updated as appropriate    Time 1   Period Weeks   Status New           PT Long Term Goals - 12/23/16 1753      PT LONG TERM GOAL #1   Title Patient to demonstrate functional strength 5/5 in all tested muscle groups in order to improve function and reduce pain    Time 6   Period Weeks   Status New     PT LONG TERM GOAL #2   Title  Patient to report resolution of radicular symptoms in order to show overall improvement of condition and improve functional task tolerance    Time 6   Period Weeks   Status New     PT LONG TERM GOAL #3   Title Patient to experience lumbar pain no more than 1/10 in order to improve QOL and improve functional task tolerance    Time 6   Period Weeks   Status New     PT LONG TERM GOAL #4   Title Patient to be participatory in regular aerobic exercise program, at least 3 days per week and 20 minutes in duration, in order to maintain functional gains and assist in preventing recurrence of symptoms    Time 6   Period Weeks   Status New               Plan - 02/01/17 1654    Clinical Impression Statement Pt continues to make good progress overall with reported improvements in her symptoms and activity tolerance. She was able to perform housework activities over the weekend with some minor adjustments and without difficulty. Therapist spent a portion of the session educating pt on proper lifting mechanics in addition to encouraging pt to implement daily walking program for improved endurance and tolerance to activity. Ended session with MFR to Lt piriformis with noted improvements in flexibility following this. Pt reporting no increase in pain by the end of the session.    Rehab Potential Good   Clinical Impairments Affecting Rehab Potential (+) appears motivated to participate in skilled PT services, fairly good prior health status; (-) has had back problems in the past, sedentary lifestyle    PT Frequency 2x / week   PT Duration 6 weeks   PT Treatment/Interventions ADLs/Self Care Home Management;Biofeedback;Cryotherapy;Moist Heat;Gait training;Stair training;Functional mobility training;Therapeutic activities;Therapeutic exercise;Balance training;Neuromuscular re-education;Patient/family education;Manual techniques;Passive range of motion;Dry needling;Energy conservation;Taping   PT Next  Visit Plan Progress to standing functional activities to improve confidence and reduce fear of certain movements; follow up on walking program    PT Home Exercise Plan piriformis stretch avoiding lumbar flexion, sciatic flossing; repeated extension in prone and standing, ankle 4-way with blue band     Consulted and Agree with Plan  of Care Patient      Patient will benefit from skilled therapeutic intervention in order to improve the following deficits and impairments:  Improper body mechanics, Pain, Decreased coordination, Decreased mobility, Increased muscle spasms, Postural dysfunction, Decreased activity tolerance, Decreased range of motion, Decreased strength, Hypomobility, Impaired perceived functional ability, Difficulty walking, Impaired flexibility  Visit Diagnosis: Acute left-sided low back pain with left-sided sciatica  Muscle weakness (generalized)  Difficulty in walking, not elsewhere classified  Other symptoms and signs involving the musculoskeletal system     Problem List Patient Active Problem List   Diagnosis Date Noted  . High risk medication use 01/13/2017  . History of iritis 01/13/2017  . Essential hypertension 01/13/2017  . Macular degeneration 01/13/2017  . 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 Leg weakness 07/08/2014  . Difficulty walking 07/08/2014  . Vaginal dryness   . Reiter's syndrome ([place])   . Raynaud disease   . Fibromyalgia   . Inflammatory arthritis   . Osteopenia    5:12 PM,02/01/17 Elly Modena PT, DPT [facility] [place] [address] [place], [place], [address] Phone: [phone]   Fax:  [phone]  Name: Monica [name] MRN: [id number] Date of Birth: [birth year]

## 2017-02-03 ENCOUNTER — Ambulatory Visit (HOSPITAL_COMMUNITY): Payer: 59

## 2017-02-03 DIAGNOSIS — M5442 Lumbago with sciatica, left side: Secondary | ICD-10-CM | POA: Diagnosis not present

## 2017-02-03 DIAGNOSIS — M6281 Muscle weakness (generalized): Secondary | ICD-10-CM

## 2017-02-03 DIAGNOSIS — R29898 Other symptoms and signs involving the musculoskeletal system: Secondary | ICD-10-CM

## 2017-02-03 DIAGNOSIS — R262 Difficulty in walking, not elsewhere classified: Secondary | ICD-10-CM

## 2017-02-03 NOTE — Therapy (Signed)
[place], [place], [address] Phone: [phone]   Fax:  [phone]  Physical Therapy Treatment  Patient Details  Name: TAMORAH HADA MRN: [id number] Date of Birth: 1956/01/02 Referring Provider: Magnus Sinning  Encounter Date: 02/03/2017      PT End of Session - 02/03/17 1527    Visit Number 11   Number of Visits 13   Date for PT Re-Evaluation 02/03/17   Authorization Type [employer]    Authorization Time Period 12/23/16 to 02/03/17   Authorization - Visit Number 11   Authorization - Number of Visits 20   PT Start Time 2774   PT Stop Time 1287   PT Time Calculation (min) 38 min   Activity Tolerance Patient tolerated treatment well;No increased pain   Behavior During Therapy WFL for tasks assessed/performed      Past Medical History:  Diagnosis Date  . Arthritis   . Fibromyalgia   . Macular degeneration    Left eye  . Osteopenia 12/2014   T score -2.1 FRAX 8.6%/1.1%  . Raynaud disease   . Reiters syndrome   . Sleep disturbance   . Vaginal dryness     Past Surgical History:  Procedure Laterality Date  . BREAST SURGERY     LEFT BR LUMP-Benign    There were no vitals filed for this visit.      Subjective Assessment - 02/03/17 1526    Subjective Pt stated she is feeling good today, no reoprts of pain today.  Reports complaince with HEP wihtout questions.     Pertinent History Raynauds, Reiters syndrome, fibromyalgia, OA, osteopenia    Patient Stated Goals reduce pain, return to PLOF, be able to go on a long trip in June    Currently in Pain? No/denies             [facility] Adult PT Treatment/Exercise - 02/03/17 0001      Lumbar Exercises: Standing   Functional Squats 10 reps  2 sets without HHA   Functional Squats Limitations good mechanics following initial cueing   Lifting From 12";From waist;10 reps   Lifting Weights (lbs) --  8 then 16lb from chair; 0# lifting from floor,  8#from 12in   Other Standing Lumbar Exercises tandem hold with trunk rotation Lt and Rt 2x10 each direction with each LE forward    Other Standing Lumbar Exercises sidestep RTB 2RT; SLS Lt 13", Rt 15" max of 3; vector stance 3x 5" intermittent HHA                  PT Short Term Goals - 02/03/17 1541      PT SHORT TERM GOAL #2   Title Patient to report that her radicular symptoms only reach her L knee in order to show improvement of condition    Baseline 04/26: Reports minimal radicular symptoms      PT SHORT TERM GOAL #3   Title Patient to report she has been able to sleep through the night without waking due to pain in order to improve QOL    Baseline 04/26: Reports no longer waking due to pain     PT SHORT TERM GOAL #4   Title Patient to be independent in correctly and consistently performing targeted HEP, to be updated as appropriate    Baseline 02/03/17:  Reports compliance daily   Status Achieved           PT Long Term Goals - 02/03/17 1549  PT LONG TERM GOAL #1   Title Patient to demonstrate functional strength 5/5 in all tested muscle groups in order to improve function and reduce pain      PT LONG TERM GOAL #2   Title Patient to report resolution of radicular symptoms in order to show overall improvement of condition and improve functional task tolerance      PT LONG TERM GOAL #3   Title Patient to experience lumbar pain no more than 1/10 in order to improve QOL and improve functional task tolerance      PT LONG TERM GOAL #4   Title Patient to be participatory in regular aerobic exercise program, at least 3 days per week and 20 minutes in duration, in order to maintain functional gains and assist in preventing recurrence of symptoms                Plan - 02/03/17 1600    Clinical Impression Statement Pt making great progress overall with no reports of radicular symptoms and pain free.  Reports increased housework activities and improved  confidence with assistance grandchildren.  Reviewed proper lifting mechanics with ability to demonstrate proper lifting mechanics.  Pt able to complete whole session with no reports of pain.  Added glut med strengthening and SLS to improve core stability.   Rehab Potential Good   Clinical Impairments Affecting Rehab Potential (+) appears motivated to participate in skilled PT services, fairly good prior health status; (-) has had back problems in the past, sedentary lifestyle    PT Frequency 2x / week   PT Duration 6 weeks   PT Treatment/Interventions ADLs/Self Care Home Management;Biofeedback;Cryotherapy;Moist Heat;Gait training;Stair training;Functional mobility training;Therapeutic activities;Therapeutic exercise;Balance training;Neuromuscular re-education;Patient/family education;Manual techniques;Passive range of motion;Dry needling;Energy conservation;Taping   PT Next Visit Plan Reassess next session.     PT Home Exercise Plan piriformis stretch avoiding lumbar flexion, sciatic flossing; repeated extension in prone and standing, ankle 4-way with blue band        Patient will benefit from skilled therapeutic intervention in order to improve the following deficits and impairments:  Improper body mechanics, Pain, Decreased coordination, Decreased mobility, Increased muscle spasms, Postural dysfunction, Decreased activity tolerance, Decreased range of motion, Decreased strength, Hypomobility, Impaired perceived functional ability, Difficulty walking, Impaired flexibility  Visit Diagnosis: Acute left-sided low back pain with left-sided sciatica  Muscle weakness (generalized)  Difficulty in walking, not elsewhere classified  Other symptoms and signs involving the musculoskeletal system     Problem List Patient Active Problem List   Diagnosis Date Noted  . High risk medication use 01/13/2017  . History of iritis 01/13/2017  . Essential hypertension 01/13/2017  . Macular degeneration  01/13/2017  . 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 Leg weakness 07/08/2014  . Difficulty walking 07/08/2014  . Vaginal dryness   . Reiter's syndrome ([place])   . Raynaud disease   . Fibromyalgia   . Inflammatory arthritis   . Osteopenia    Ihor Austin, LPTA; [place]  Aldona Lento 02/03/2017, 5:11 PM  [place], [place], [address] Phone: [phone]   Fax:  [phone]  Name: JOE TANNEY MRN: [id number] Date of Birth: [birth year]

## 2017-02-03 NOTE — Patient Instructions (Signed)
Standing on Left leg bring Rt leg forward, to the side and behind holding for 5 seconds each direction.  Repeat 3 times.

## 2017-02-09 ENCOUNTER — Ambulatory Visit (HOSPITAL_COMMUNITY): Payer: 59 | Attending: Physical Medicine and Rehabilitation | Admitting: Physical Therapy

## 2017-02-09 DIAGNOSIS — R29898 Other symptoms and signs involving the musculoskeletal system: Secondary | ICD-10-CM

## 2017-02-09 DIAGNOSIS — M6281 Muscle weakness (generalized): Secondary | ICD-10-CM | POA: Diagnosis present

## 2017-02-09 DIAGNOSIS — R262 Difficulty in walking, not elsewhere classified: Secondary | ICD-10-CM | POA: Insufficient documentation

## 2017-02-09 DIAGNOSIS — M5442 Lumbago with sciatica, left side: Secondary | ICD-10-CM | POA: Insufficient documentation

## 2017-02-09 NOTE — Therapy (Signed)
[place], [place], [address] Phone: [phone]   Fax:  [phone]  Physical Therapy Treatment (Discharge)  Patient Details  Name: Monica [name] MRN: [id number] Date of Birth: November 30, 1955 Referring Provider: Magnus Sinning   Encounter Date: 02/09/2017      PT End of Session - 02/09/17 1534    Visit Number 12   Number of Visits [place] Time Period 12/23/16 to 02/03/17   Authorization - Visit Number 12   Authorization - Number of Visits 20   PT Start Time 6811   PT Stop Time 1420   PT Time Calculation (min) 32 min   Activity Tolerance Patient tolerated treatment well   Behavior During Therapy [facility] for tasks assessed/performed      Past Medical History:  Diagnosis Date  . Arthritis   . Fibromyalgia   . Macular degeneration    Left eye  . Osteopenia 12/2014   T score -2.1 FRAX 8.6%/1.1%  . Raynaud disease   . Reiters syndrome   . Sleep disturbance   . Vaginal dryness     Past Surgical History:  Procedure Laterality Date  . BREAST SURGERY     LEFT BR LUMP-Benign    There were no vitals filed for this visit.      Subjective Assessment - 02/09/17 1350    Subjective Patient arrives stating she had some increased pain this past weekend  and had to take some extra strength tylenol. She feels like she can do everything as long as she is careful. She states that she might have flared up her back when trying the [employer] exercises for OA.  She grades herself as being a C.    Pertinent History Raynauds, Reiters syndrome, fibromyalgia, OA, osteopenia    How long can you sit comfortably? 5/2- 60 minutes before she needs to get up and move around but sometimes needs to do longer at work    How long can you stand comfortably? 5/2- not sure but standing is easier than sitting sometimes    How long can you walk comfortably? 5/2- 30 minutes    Patient Stated Goals  reduce pain, return to PLOF, be able to go on a long trip in June    Currently in Pain? Yes   Pain Score 1    Pain Location Back   Pain Orientation Left   Pain Descriptors / Indicators Aching;Dull   Pain Type Chronic pain   Pain Radiating Towards somtimes radiates down L LE    Pain Onset More than a month ago   Pain Frequency Intermittent   Aggravating Factors  Maybe OA exercises from [employer]    Pain Relieving Factors piriformist releease    Effect of Pain on Daily Activities minor impact             [facility] PT Assessment - 02/09/17 0001      Assessment   Medical Diagnosis back pain    Referring Provider Magnus Sinning    Onset Date/Surgical Date --  February 2017   Next MD Visit Only if needed   Prior Therapy none      Precautions   Precaution Comments caution with heavy lifting, no quick twisting      Balance Screen   Has the patient fallen in the past 6 months No   Has the patient had a decrease in activity level because of a fear of falling?  No   Is the patient reluctant to leave their home because of a fear of falling?  No     Prior Function   Level of Independence Independent;Independent with basic ADLs;Independent with gait;Independent with transfers   Vocation Full time employment   Higher education careers adviser for [employer]      AROM   Lumbar Flexion [facility]    Lumbar Extension [facility]    Lumbar - Right Side Bend WFL    Lumbar - Left Side [facility]      Strength   Right Hip Flexion 5/5   Right Hip Extension 4+/5   Right Hip ABduction 4/5   Left Hip Flexion 5/5   Left Hip Extension 4-/5   Left Hip ABduction 4/5   Right Knee Flexion 5/5   Right Knee Extension 5/5   Left Knee Flexion 4/5   Left Knee Extension 5/5   Right Ankle Dorsiflexion 5/5   Left Ankle Dorsiflexion 5/5     Flexibility   Hamstrings moderate tightness L LE    Piriformis moderate tigtnesss L LE                              PT Education - 02/09/17  1532    Education provided Yes   Education Details [employer], senior center, water exercise; DC today; local massage therapists    Person(s) Educated Patient   Methods Explanation   Comprehension Verbalized understanding          PT Short Term Goals - 02/09/17 1407      PT SHORT TERM GOAL #1   Title Patient to demonstrate resolution of L hamstring/piriformis impairment as well as lumbar ROM being WFL on all planes with no pain exacerbation in order to show improvement of condition    Baseline 5/2- lumbar WFL, L HS/piriformis are both tight    Time 3   Period Weeks   Status Partially Met     PT SHORT TERM GOAL #2   Title Patient to report that her radicular symptoms only reach her L knee in order to show improvement of condition    Baseline 5/2- sometimes occasional symtpoms down to foot    Time 3   Period Weeks   Status Partially Met     PT SHORT TERM GOAL #3   Title Patient to report she has been able to sleep through the night without waking due to pain in order to improve QOL    Baseline 5/2- usually can sleep except occasionally    Time 3   Period Weeks   Status Partially Met     PT SHORT TERM GOAL #4   Title Patient to be independent in correctly and consistently performing targeted HEP, to be updated as appropriate    Baseline 02/03/17:  Reports compliance daily   Time 1   Period Weeks   Status Achieved           PT Long Term Goals - 02/09/17 1409      PT LONG TERM GOAL #1   Title Patient to demonstrate functional strength 5/5 in all tested muscle groups in order to improve function and reduce pain    Baseline 5/2- improved    Time 6   Period Weeks   Status On-going     PT LONG TERM GOAL #2   Title Patient to report resolution of radicular symptoms in order to show overall improvement of condition and  improve functional task tolerance    Baseline 5/2- still occasionally has them    Time 6   Period Weeks   Status Partially Met     PT LONG TERM GOAL #3    Title Patient to experience lumbar pain no more than 1/10 in order to improve QOL and improve functional task tolerance    Baseline 5/2- at worst can get to 3/10   Time 6   Period Weeks   Status On-going     PT LONG TERM GOAL #4   Title Patient to be participatory in regular aerobic exercise program, at least 3 days per week and 20 minutes in duration, in order to maintain functional gains and assist in preventing recurrence of symptoms    Time 6   Period Weeks   Status On-going               Plan - 2017/02/22 1536    Clinical Impression Statement Re-assessment performed today. Patient appears to have improved with skilled PT services, and states that while she feels better and feels she can do mostly everything as long as she is careful, she does continue to have pain at this point although it is fairly low level. She feels that she will likely be able to manage her symptoms moving forward. Discussed and recommended exercise at [facility] including water based activities, silver sneakers, and regular activity in general moving forward. Recommend DC today however patient may return to skilled PT services with new MD order if an acute problem comes up.    Rehab Potential Good   Clinical Impairments Affecting Rehab Potential (+) appears motivated to participate in skilled PT services, fairly good prior health status; (-) has had back problems in the past, sedentary lifestyle    PT Next Visit Plan DC today    Consulted and Agree with Plan of Care Patient      Patient will benefit from skilled therapeutic intervention in order to improve the following deficits and impairments:  Improper body mechanics, Pain, Decreased coordination, Decreased mobility, Increased muscle spasms, Postural dysfunction, Decreased activity tolerance, Decreased range of motion, Decreased strength, Hypomobility, Impaired perceived functional ability, Difficulty walking, Impaired flexibility  Visit Diagnosis: Acute  left-sided low back pain with left-sided sciatica - Plan: PT plan of care cert/re-cert  Muscle weakness (generalized) - Plan: PT plan of care cert/re-cert  Difficulty in walking, not elsewhere classified - Plan: PT plan of care cert/re-cert  Other symptoms and signs involving the musculoskeletal system - Plan: PT plan of care cert/re-cert       G-Codes - 02-22-17 1539    Functional Assessment Tool Used (Outpatient Only) Based on skilled clinical assessment of strength, and activity tolerance   Functional Limitation Mobility: Walking and moving around   Mobility: Walking and Moving Around Goal Status [phone]) At least 1 percent but less than 20 percent impaired, limited or restricted   Mobility: Walking and Moving Around Discharge Status [phone]) At least 1 percent but less than 20 percent impaired, limited or restricted      Problem List Patient Active Problem List   Diagnosis Date Noted  . High risk medication use 01/13/2017  . History of iritis 01/13/2017  . Essential hypertension 01/13/2017  . Macular degeneration 01/13/2017  . 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 Leg weakness 07/08/2014  . Difficulty walking 07/08/2014  . Vaginal dryness   . Reiter's syndrome ([place])   . Raynaud disease   . Fibromyalgia   .  Inflammatory arthritis   . Osteopenia      PHYSICAL THERAPY DISCHARGE SUMMARY  Visits from Start of Care: 12  Current functional level related to goals / functional outcomes: Patient has improved with skilled PT services, however appears to have likely maximized benefit from skilled services at this time. Recommend DC to independent exercise/activity program at this point.    Remaining deficits: Mild lumbar pain, mild functional weakness, reduced core strength, functional activity tolerance    Education / Equipment: [employer], senior center, water exercise; DC today; local massage therapists  Plan: Patient agrees to discharge.   Patient goals were partially met. Patient is being discharged due to being pleased with the current functional level.  ?????     Deniece Ree PT, DPT [place] [address] [place], [place], [address] Phone: [phone]   Fax:  [phone]  Name: Monica [name] MRN: [id number] Date of Birth: Dec 26, 1955

## 2017-02-15 ENCOUNTER — Other Ambulatory Visit: Payer: Self-pay | Admitting: Rheumatology

## 2017-02-15 NOTE — Telephone Encounter (Signed)
Last Visit: 01/17/17 Next Visit: 06/15/17 Labs: 01/17/17 WNL  Okay to refill Voltaren Gel?

## 2017-02-15 NOTE — Telephone Encounter (Signed)
Last Visit: 01/17/17 Next Visit: 06/15/17 Labs: 01/17/17 WNL  Okay to refill SSZ?

## 2017-02-15 NOTE — Telephone Encounter (Signed)
ok 

## 2017-02-15 NOTE — Telephone Encounter (Signed)
Last Visit: 01/17/17 Next Visit: 06/15/17 Labs: 01/17/17 WNL  Okay to refill Celebrex?

## 2017-03-08 ENCOUNTER — Other Ambulatory Visit: Payer: Self-pay | Admitting: Rheumatology

## 2017-03-08 NOTE — Telephone Encounter (Signed)
ok 

## 2017-03-08 NOTE — Telephone Encounter (Signed)
Last Visit: 01/17/17 Next Visit: 06/15/17  Okay to refill FABB?

## 2017-04-01 ENCOUNTER — Other Ambulatory Visit: Payer: Self-pay | Admitting: Rheumatology

## 2017-04-01 NOTE — Telephone Encounter (Signed)
Last Visit: 01/17/17 Next Visit: 06/15/17 Labs: 01/17/17 WNL  Okay to refill Celebrex and SSZ?

## 2017-04-01 NOTE — Telephone Encounter (Signed)
ok 

## 2017-06-06 ENCOUNTER — Other Ambulatory Visit: Payer: Self-pay | Admitting: Rheumatology

## 2017-06-06 NOTE — Telephone Encounter (Signed)
Last Visit: 01/17/17 Next Visit: 06/15/17 Labs: 01/17/17 WNL  Left message to advise patient she is due to update labs.  Okay to refill 30 day supply Celebrex and  SSZ?

## 2017-06-06 NOTE — Telephone Encounter (Signed)
ok 

## 2017-06-07 ENCOUNTER — Telehealth: Payer: Self-pay | Admitting: Rheumatology

## 2017-06-07 ENCOUNTER — Other Ambulatory Visit: Payer: Self-pay | Admitting: *Deleted

## 2017-06-07 DIAGNOSIS — Z79899 Other long term (current) drug therapy: Secondary | ICD-10-CM

## 2017-06-07 NOTE — Telephone Encounter (Signed)
[facility] in [place] requesting orders for patient. Patient there now. Fax [phone]

## 2017-06-07 NOTE — Telephone Encounter (Signed)
Labs released and faxed  

## 2017-06-08 LAB — CBC WITH DIFFERENTIAL/PLATELET
BASOS ABS: 0 10*3/uL (ref 0.0–0.2)
Basos: 0 %
EOS (ABSOLUTE): 0.1 10*3/uL (ref 0.0–0.4)
EOS: 1 %
HEMATOCRIT: 40.6 % (ref 34.0–46.6)
Hemoglobin: 13 g/dL (ref 11.1–15.9)
IMMATURE GRANULOCYTES: 0 %
Immature Grans (Abs): 0 10*3/uL (ref 0.0–0.1)
LYMPHS ABS: 1.7 10*3/uL (ref 0.7–3.1)
Lymphs: 36 %
MCH: 28.2 pg (ref 26.6–33.0)
MCHC: 32 g/dL (ref 31.5–35.7)
MCV: 88 fL (ref 79–97)
MONOS ABS: 0.4 10*3/uL (ref 0.1–0.9)
Monocytes: 9 %
NEUTROS ABS: 2.6 10*3/uL (ref 1.4–7.0)
Neutrophils: 54 %
PLATELETS: 287 10*3/uL (ref 150–379)
RBC: 4.61 x10E6/uL (ref 3.77–5.28)
RDW: 14 % (ref 12.3–15.4)
WBC: 4.7 10*3/uL (ref 3.4–10.8)

## 2017-06-08 LAB — CMP14+EGFR
A/G RATIO: 1.8 (ref 1.2–2.2)
ALK PHOS: 92 IU/L (ref 39–117)
ALT: 23 IU/L (ref 0–32)
AST: 29 IU/L (ref 0–40)
Albumin: 4.4 g/dL (ref 3.6–4.8)
BUN/Creatinine Ratio: 16 (ref 12–28)
BUN: 12 mg/dL (ref 8–27)
CALCIUM: 9.5 mg/dL (ref 8.7–10.3)
CHLORIDE: 100 mmol/L (ref 96–106)
CO2: 23 mmol/L (ref 20–29)
Creatinine, Ser: 0.77 mg/dL (ref 0.57–1.00)
GFR calc Af Amer: 97 mL/min/{1.73_m2} (ref >59)
GFR, EST NON AFRICAN AMERICAN: 84 mL/min/{1.73_m2} (ref >59)
GLOBULIN, TOTAL: 2.5 g/dL (ref 1.5–4.5)
Glucose: 95 mg/dL (ref 65–99)
POTASSIUM: 4.3 mmol/L (ref 3.5–5.2)
SODIUM: 141 mmol/L (ref 134–144)
Total Protein: 6.9 g/dL (ref 6.0–8.5)

## 2017-06-08 NOTE — Progress Notes (Signed)
WNLs

## 2017-06-10 ENCOUNTER — Other Ambulatory Visit: Payer: Self-pay | Admitting: Rheumatology

## 2017-06-10 MED ORDER — DICLOFENAC SODIUM 1 % TD GEL: TRANSDERMAL | 0 refills | Status: DC

## 2017-06-10 NOTE — Telephone Encounter (Signed)
Last Visit: 01/17/17 Next Visit: 06/15/17  Okay to refill per Dr. Estanislado Pandy

## 2017-06-10 NOTE — Progress Notes (Signed)
Office Visit Note  Patient: Monica [name]             Date of Birth: 1955-11-04           MRN: [id number]             PCP: Lemmie Evens, MD Referring: Lemmie Evens, MD Visit Date: 06/15/2017 Occupation: @GUAROCC @    Subjective:  Medication Management   History of Present Illness: Monica [name] is a 61 y.o. female with history of inflammatory arthritis. She states she has not had any flares since the last visit. Her knee joints are doing well. She has had no flares of iritis as well. She missed a few doses of tophus of was seen but did not have a flare. She had a mild flare in her left wrist about 3 months ago after increased activity. She has some generalized pain from fibromyalgia but is tolerable. She has not had any flare of Raynaud's phenomenon.  Activities of Daily Living:  Patient reports morning stiffness for 45 minutes.   Patient Denies nocturnal pain.  Difficulty dressing/grooming: Denies Difficulty climbing stairs: Denies Difficulty getting out of chair: Denies Difficulty using hands for taps, buttons, cutlery, and/or writing: Denies   Review of Systems  Constitutional: Negative for fatigue, night sweats, weight gain, weight loss and weakness.  HENT: Negative for mouth sores, trouble swallowing, trouble swallowing, mouth dryness and nose dryness.   Eyes: Negative for pain, redness, visual disturbance and dryness.  Respiratory: Negative for cough, shortness of breath and difficulty breathing.   Cardiovascular: Positive for hypertension. Negative for chest pain, palpitations, irregular heartbeat and swelling in legs/feet.  Gastrointestinal: Negative for blood in stool, constipation and diarrhea.  Endocrine: Negative for increased urination.  Genitourinary: Negative for vaginal dryness.  Musculoskeletal: Negative for arthralgias, joint pain, joint swelling, myalgias, muscle weakness, morning stiffness, muscle tenderness and myalgias.  Skin: Negative for color  change, rash, hair loss, skin tightness, ulcers and sensitivity to sunlight.  Allergic/Immunologic: Negative for susceptible to infections.  Neurological: Negative for dizziness, memory loss and night sweats.  Hematological: Negative for swollen glands.  Psychiatric/Behavioral: Negative for depressed mood and sleep disturbance. The patient is not nervous/anxious.     PMFS History:  Patient Active Problem List   Diagnosis Date Noted  . High risk medication use 01/13/2017  . History of iritis 01/13/2017  . Essential hypertension 01/13/2017  . Macular degeneration 01/13/2017  . 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 Leg weakness 07/08/2014  . Difficulty walking 07/08/2014  . Vaginal dryness   . 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 Raynaud disease   . Fibromyalgia   . Inflammatory arthritis   . Osteopenia     Past Medical History:  Diagnosis Date  . Arthritis   . Fibromyalgia   . Macular degeneration    Left eye  . Osteopenia 12/2014   T score -2.1 FRAX 8.6%/1.1%  . Raynaud disease   . Reiters syndrome   . Sleep disturbance   . Vaginal dryness     Family History  Problem Relation Age of Onset  . Hypertension Sister   . Hyperlipidemia Sister   . Heart disease Brother   . Hyperlipidemia Brother   . Alzheimer's disease Mother    Past Surgical History:  Procedure Laterality Date  . BREAST SURGERY     LEFT BR LUMP-Benign   Social History   Social History Narrative  . No narrative on file     Objective: Vital  Signs: BP 124/68   Pulse 74   Resp 14   Ht 5\' 4"  (1.626 m)   Wt 142 lb (64.4 kg)   BMI 24.37 kg/m    Physical Exam  Constitutional: She is oriented to person, place, and time. She appears well-developed and well-nourished.  HENT:  Head: Normocephalic and atraumatic.  Eyes: Conjunctivae and EOM are normal.  Neck: Normal range of motion.  Cardiovascular: Normal rate, regular rhythm, normal heart sounds and intact distal  pulses.   Pulmonary/Chest: Effort normal and breath sounds normal.  Abdominal: Soft. Bowel sounds are normal.  Lymphadenopathy:    She has no cervical adenopathy.  Neurological: She is alert and oriented to person, place, and time.  Skin: Skin is warm and dry. Capillary refill takes less than 2 seconds.  Psychiatric: She has a normal mood and affect. Her behavior is normal.  Nursing note and vitals reviewed.    Musculoskeletal Exam: C-spine and thoracic lumbar spine good range of motion. Shoulder joints elbow joints wrist joint MCPs PIPs DIPs with good range of motion with no synovitis. Hip joints knee joints ankles MTPs PIPs DIPs with good range of motion with no synovitis. She did not have any tender points.  CDAI Exam: CDAI Homunculus Exam:   Joint Counts:  CDAI Tender Joint count: 0 CDAI Swollen Joint count: 0  Global Assessments:  Patient Global Assessment: 5 Provider Global Assessment: 2  CDAI Calculated Score: 7    Investigation: No additional findings. CBC Latest Ref Rng & Units 06/07/2017 01/17/2017 10/26/2016  WBC 3.4 - 10.8 x10E3/uL 4.7 5.2 8.5  Hemoglobin 11.1 - 15.9 g/dL 13.0 13.5 13.0  Hematocrit 34.0 - 46.6 % 40.6 41.7 40.1  Platelets 150 - 379 x10E3/uL 287 300 307   CMP Latest Ref Rng & Units 06/07/2017 01/17/2017 10/26/2016  Glucose 65 - 99 mg/dL 95 116(H) 110(H)  BUN 8 - 27 mg/dL 12 9 8   Creatinine 0.57 - 1.00 mg/dL 0.77 0.83 0.81  Sodium 134 - 144 mmol/L 141 142 140  Potassium 3.5 - 5.2 mmol/L 4.3 4.0 3.6  Chloride 96 - 106 mmol/L 100 101 105  CO2 20 - 29 mmol/L 23 27 30   Calcium 8.7 - 10.3 mg/dL 9.5 9.2 9.0  Total Protein 6.0 - 8.5 g/dL 6.9 6.5 -  Total Bilirubin 0.0 - 1.2 mg/dL <0.2 <0.2 -  Alkaline Phos 39 - 117 IU/L 92 81 -  AST 0 - 40 IU/L 29 32 -  ALT 0 - 32 IU/L 23 28 -    Imaging: No results found.  Speciality Comments: No specialty comments available.    Procedures:  No procedures performed Allergies: Patient has no known allergies.    Assessment / Plan:     Visit Diagnoses: Inflammatory arthritis: Patient has no active synovitis on examination today. She states she had a mild flare about 3 months ago which got better by itself.  High risk medication use - Sulfasalazine 500 mg 2 tabs po bid, Celebrex 200mg  po qd // patient has had nausea in the past with Methotrexate. Her labs have been stable. We will continue to monitor labs every 3 months. I've advised her to discontinue Celebrex and only take it on when necessary basis. A list of natural anti-inflammatories was given to try.  History of iritis: No recurrence  Fibromyalgia: She continues to have some generalized pain.  Raynaud's disease without gangrene: She is not having any active problems with Raynauds.  Osteopenia of multiple sites - -2.1 lowest measured T score,  AP Lumbar spine March 2016 DEXA ordered by Dr Phineas Real   History of macular degeneration  History of hypertension    Orders: No orders of the defined types were placed in this encounter.  No orders of the defined types were placed in this encounter.     Follow-Up Instructions: Return in about 5 months (around 11/15/2017) for Inflammatory arthritis, FMS.   Bo Merino, MD  Note - This record has been created using Editor, commissioning.  Chart creation errors have been sought, but may not always  have been located. Such creation errors do not reflect on  the standard of medical care.

## 2017-06-15 ENCOUNTER — Ambulatory Visit (INDEPENDENT_AMBULATORY_CARE_PROVIDER_SITE_OTHER): Payer: 59 | Admitting: Rheumatology

## 2017-06-15 ENCOUNTER — Encounter: Payer: Self-pay | Admitting: Rheumatology

## 2017-06-15 VITALS — BP 124/68 | HR 74 | Resp 14 | Ht 64.0 in | Wt 142.0 lb

## 2017-06-15 DIAGNOSIS — I7300 Raynaud's syndrome without gangrene: Secondary | ICD-10-CM | POA: Diagnosis not present

## 2017-06-15 DIAGNOSIS — M1990 Unspecified osteoarthritis, unspecified site: Secondary | ICD-10-CM | POA: Diagnosis not present

## 2017-06-15 DIAGNOSIS — Z8669 Personal history of other diseases of the nervous system and sense organs: Secondary | ICD-10-CM | POA: Diagnosis not present

## 2017-06-15 DIAGNOSIS — Z79899 Other long term (current) drug therapy: Secondary | ICD-10-CM

## 2017-06-15 DIAGNOSIS — Z8679 Personal history of other diseases of the circulatory system: Secondary | ICD-10-CM

## 2017-06-15 DIAGNOSIS — M797 Fibromyalgia: Secondary | ICD-10-CM

## 2017-06-15 DIAGNOSIS — M8589 Other specified disorders of bone density and structure, multiple sites: Secondary | ICD-10-CM

## 2017-06-15 NOTE — Patient Instructions (Addendum)
Standing Labs We placed an order today for your standing lab work.    Please come back and get your standing labs in November and every 3 months.  We have open lab Monday through Friday from 8:30-11:30 AM and 1:30-4 PM at the office of Dr. Bo Merino.   The office is located at [address], [place], [place], [place] [address] No appointment is necessary.   Labs are drawn by [facility].  You may receive a bill from [facility] for your lab work. If you have any questions regarding directions or hours of operation,  please call [phone].     Natural anti-inflammatories  You can purchase these at [employer], [employer] or online.  . Turmeric (capsules)  . Ginger (ginger root or capsules)  . Omega 3 (Fish, flax seeds, chia seeds, walnuts, almonds)  . Tart cherry (dried or extract)   Patient should be under the care of a physician while taking these supplements. This may not be reproduced without the permission of Dr. Bo Merino.

## 2017-06-23 ENCOUNTER — Other Ambulatory Visit: Payer: Self-pay | Admitting: Rheumatology

## 2017-06-23 NOTE — Telephone Encounter (Signed)
Last Visit: 06/15/17 Next Visit: 12/15/17  Okay to refill per Dr. Estanislado Pandy

## 2017-07-06 ENCOUNTER — Encounter: Payer: Self-pay | Admitting: Rheumatology

## 2017-07-06 NOTE — Telephone Encounter (Signed)
Ok to go back on Celebrex.

## 2017-07-14 ENCOUNTER — Other Ambulatory Visit: Payer: Self-pay | Admitting: Rheumatology

## 2017-07-14 NOTE — Telephone Encounter (Signed)
Last Visit: 06/15/17 Next Visit: 12/15/17 Labs: 06/07/17 WNL  Okay to refill per Dr. Estanislado Pandy

## 2017-08-15 ENCOUNTER — Other Ambulatory Visit: Payer: Self-pay | Admitting: Rheumatology

## 2017-08-15 NOTE — Telephone Encounter (Signed)
Last Visit: 06/15/17 Next Visit: 12/15/17 Labs: 06/07/17 WNL  Okay to refill per Dr. Estanislado Pandy

## 2017-09-16 ENCOUNTER — Other Ambulatory Visit: Payer: Self-pay | Admitting: Rheumatology

## 2017-09-21 NOTE — Telephone Encounter (Addendum)
Last Visit: 06/15/17 Next Visit: 12/15/17 Labs: 06/07/17 WNL  Left message for patient to advise she is due to update labs.   Okay to refill 30 day supply per Dr. Estanislado Pandy

## 2017-09-26 ENCOUNTER — Other Ambulatory Visit: Payer: Self-pay

## 2017-09-26 DIAGNOSIS — Z79899 Other long term (current) drug therapy: Secondary | ICD-10-CM

## 2017-09-27 LAB — CMP14+EGFR
ALT: 20 IU/L (ref 0–32)
AST: 21 IU/L (ref 0–40)
Albumin/Globulin Ratio: 1.9 (ref 1.2–2.2)
Albumin: 4.3 g/dL (ref 3.6–4.8)
Alkaline Phosphatase: 105 IU/L (ref 39–117)
BUN/Creatinine Ratio: 15 (ref 12–28)
BUN: 13 mg/dL (ref 8–27)
CALCIUM: 9 mg/dL (ref 8.7–10.3)
CHLORIDE: 102 mmol/L (ref 96–106)
CO2: 25 mmol/L (ref 20–29)
Creatinine, Ser: 0.89 mg/dL (ref 0.57–1.00)
GFR calc non Af Amer: 71 mL/min/{1.73_m2} (ref >59)
GFR, EST AFRICAN AMERICAN: 81 mL/min/{1.73_m2} (ref >59)
GLUCOSE: 118 mg/dL — AB (ref 65–99)
Globulin, Total: 2.3 g/dL (ref 1.5–4.5)
Potassium: 4 mmol/L (ref 3.5–5.2)
Sodium: 141 mmol/L (ref 134–144)
TOTAL PROTEIN: 6.6 g/dL (ref 6.0–8.5)

## 2017-09-27 LAB — CBC WITH DIFFERENTIAL/PLATELET
BASOS ABS: 0 10*3/uL (ref 0.0–0.2)
Basos: 0 %
EOS (ABSOLUTE): 0.1 10*3/uL (ref 0.0–0.4)
EOS: 1 %
HEMATOCRIT: 39.2 % (ref 34.0–46.6)
HEMOGLOBIN: 12.7 g/dL (ref 11.1–15.9)
IMMATURE GRANULOCYTES: 0 %
Immature Grans (Abs): 0 10*3/uL (ref 0.0–0.1)
Lymphocytes Absolute: 2.2 10*3/uL (ref 0.7–3.1)
Lymphs: 28 %
MCH: 29.6 pg (ref 26.6–33.0)
MCHC: 32.4 g/dL (ref 31.5–35.7)
MCV: 91 fL (ref 79–97)
MONOCYTES: 8 %
MONOS ABS: 0.6 10*3/uL (ref 0.1–0.9)
NEUTROS PCT: 63 %
Neutrophils Absolute: 5.1 10*3/uL (ref 1.4–7.0)
Platelets: 358 10*3/uL (ref 150–379)
RBC: 4.29 x10E6/uL (ref 3.77–5.28)
RDW: 13.5 % (ref 12.3–15.4)
WBC: 8.1 10*3/uL (ref 3.4–10.8)

## 2017-09-27 NOTE — Progress Notes (Signed)
WNL

## 2017-09-28 ENCOUNTER — Other Ambulatory Visit: Payer: Self-pay | Admitting: Rheumatology

## 2017-09-28 ENCOUNTER — Encounter: Payer: Self-pay | Admitting: Rheumatology

## 2017-10-20 ENCOUNTER — Encounter: Payer: Self-pay | Admitting: Rheumatology

## 2017-10-21 MED ORDER — PREDNISONE 5 MG PO TABS: ORAL_TABLET | ORAL | 0 refills | Status: DC

## 2017-10-21 NOTE — Telephone Encounter (Signed)
I spoke with patient and discussed at length. At this point she would like to try a prednisone taper. We also discussed possible additional DMARD's in the future. She wants to discuss that at the follow-up visit in March. Please call in a prednisone taper starting at 5 mg tablet 20 mg for 4 days and then taper by 5 mg every 4 days.

## 2017-11-03 ENCOUNTER — Other Ambulatory Visit: Payer: Self-pay | Admitting: *Deleted

## 2017-11-03 DIAGNOSIS — M8580 Other specified disorders of bone density and structure, unspecified site: Secondary | ICD-10-CM

## 2017-11-07 ENCOUNTER — Other Ambulatory Visit: Payer: Self-pay | Admitting: Rheumatology

## 2017-11-07 NOTE — Telephone Encounter (Signed)
Last Visit: 06/15/17 Next Visit: 12/15/17 Labs: 09/26/17 WNL  Okay to refill per Dr. Estanislado Pandy

## 2017-11-14 ENCOUNTER — Other Ambulatory Visit: Payer: Self-pay | Admitting: Gynecology

## 2017-11-14 DIAGNOSIS — Z1231 Encounter for screening mammogram for malignant neoplasm of breast: Secondary | ICD-10-CM

## 2017-11-16 ENCOUNTER — Ambulatory Visit (HOSPITAL_COMMUNITY)
Admission: RE | Admit: 2017-11-16 | Discharge: 2017-11-16 | Disposition: A | Discharge status: Home / Self Care | Payer: 59 | Source: Ambulatory Visit | Attending: Gynecology | Admitting: Gynecology

## 2017-11-16 DIAGNOSIS — Z1231 Encounter for screening mammogram for malignant neoplasm of breast: Secondary | ICD-10-CM

## 2017-12-01 NOTE — Progress Notes (Deleted)
Office Visit Note  Patient: Monica [name]             Date of Birth: [birth year]           MRN: [id number]             PCP: Lemmie Evens, MD Referring: Lemmie Evens, MD Visit Date: 12/15/2017 Occupation: @GUAROCC @    Subjective:  No chief complaint on file.   History of Present Illness: Monica [name] is a 62 y.o. female ***   Activities of Daily Living:  Patient reports morning stiffness for *** {minute/hour:19697}.   Patient {ACTIONS;DENIES/REPORTS:21021675::"Denies"} nocturnal pain.  Difficulty dressing/grooming: {ACTIONS;DENIES/REPORTS:21021675::"Denies"} Difficulty climbing stairs: {ACTIONS;DENIES/REPORTS:21021675::"Denies"} Difficulty getting out of chair: {ACTIONS;DENIES/REPORTS:21021675::"Denies"} Difficulty using hands for taps, buttons, cutlery, and/or writing: {ACTIONS;DENIES/REPORTS:21021675::"Denies"}   No Rheumatology ROS completed.   PMFS History:  Patient Active Problem List   Diagnosis Date Noted  . High risk medication use 01/13/2017  . History of iritis 01/13/2017  . Essential hypertension 01/13/2017  . Macular degeneration 01/13/2017  . 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 Leg weakness 07/08/2014  . Difficulty walking 07/08/2014  . Vaginal dryness   . 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 Raynaud disease   . Fibromyalgia   . Inflammatory arthritis   . Osteopenia     Past Medical History:  Diagnosis Date  . Arthritis   . Fibromyalgia   . Macular degeneration    Left eye  . Osteopenia 12/2014   T score -2.1 FRAX 8.6%/1.1%  . Raynaud disease   . Reiters syndrome   . Sleep disturbance   . Vaginal dryness     Family History  Problem Relation Age of Onset  . Hypertension Sister   . Hyperlipidemia Sister   . Heart disease Brother   . Hyperlipidemia Brother   . Alzheimer's disease Mother    Past Surgical History:  Procedure Laterality Date  . BREAST SURGERY     LEFT BR LUMP-Benign   Social  History   Social History Narrative  . Not on file     Objective: Vital Signs: There were no vitals taken for this visit.   Physical Exam   Musculoskeletal Exam: ***  CDAI Exam: No CDAI exam completed.    Investigation: No additional findings. CBC Latest Ref Rng & Units 09/26/2017 06/07/2017 01/17/2017  WBC 3.4 - 10.8 x10E3/uL 8.1 4.7 5.2  Hemoglobin 11.1 - 15.9 g/dL 12.7 13.0 13.5  Hematocrit 34.0 - 46.6 % 39.2 40.6 41.7  Platelets 150 - 379 x10E3/uL 358 287 300   CMP Latest Ref Rng & Units 09/26/2017 06/07/2017 01/17/2017  Glucose 65 - 99 mg/dL 118(H) 95 116(H)  BUN 8 - 27 mg/dL 13 12 9   Creatinine 0.57 - 1.00 mg/dL 0.89 0.77 0.83  Sodium 134 - 144 mmol/L 141 141 142  Potassium 3.5 - 5.2 mmol/L 4.0 4.3 4.0  Chloride 96 - 106 mmol/L 102 100 101  CO2 20 - 29 mmol/L 25 23 27   Calcium 8.7 - 10.3 mg/dL 9.0 9.5 9.2  Total Protein 6.0 - 8.5 g/dL 6.6 6.9 6.5  Total Bilirubin 0.0 - 1.2 mg/dL <0.2 <0.2 <0.2  Alkaline Phos 39 - 117 IU/L 105 92 81  AST 0 - 40 IU/L 21 29 32  ALT 0 - 32 IU/L 20 23 28     Imaging: Mm Screening Breast Tomo Bilateral  Result Date: 11/16/2017 CLINICAL DATA:  Screening. EXAM: 2D DIGITAL SCREENING BILATERAL MAMMOGRAM WITH 3D TOMO WITH CAD COMPARISON:  Previous exam(s).  ACR Breast Density Category c: The breast tissue is heterogeneously dense, which may obscure small masses. FINDINGS: There are no findings suspicious for malignancy. Images were processed with CAD. IMPRESSION: No mammographic evidence of malignancy. A result letter of this screening mammogram will be mailed directly to the patient. RECOMMENDATION: Screening mammogram in one year. (Code:SM-B-01Y) BI-RADS CATEGORY  1: Negative. Electronically Signed   By: Franki Cabot M.D.   On: 11/16/2017 07:56    Speciality Comments: No specialty comments available.    Procedures:  No procedures performed Allergies: Patient has no known allergies.   Assessment / Plan:     Visit Diagnoses: No diagnosis  found.    Orders: No orders of the defined types were placed in this encounter.  No orders of the defined types were placed in this encounter.   Face-to-face time spent with patient was *** minutes. 50% of time was spent in counseling and coordination of care.  Follow-Up Instructions: No Follow-up on file.   Earnestine Mealing, CMA  Note - This record has been created using Editor, commissioning.  Chart creation errors have been sought, but may not always  have been located. Such creation errors do not reflect on  the standard of medical care.

## 2017-12-09 NOTE — Progress Notes (Addendum)
Office Visit Note  Patient: Monica [name]             Date of Birth: [birth year]           MRN: [id number]             PCP: Lemmie Evens, MD Referring: Lemmie Evens, MD Visit Date: 12/15/2017 Occupation: _0 @    Subjective:  Left [place] joint pain   History of Present Illness: Monica [name] is a 62 y.o. female with history of inflammatory arthritis iritis, raynaud's, and fibromyalgia.  Patient states she continues to take sulfasalazine 500 mg 2 tablets p.o. twice daily.  She states she has missed a few doses in the past few weeks.  She states she often forgets her evening dose.  She states she averages missing 2-3 doses a week.  She states she has been having increased pain in multiple joints.  She has noticed pain in her left [facility] joint that is radiating up her forearm.  She also has been having right wrist pain.  She denies any overuse activities or change in her activity level.  She states last night she took Tylenol and Celebrex for pain relief.  She also was icing her right wrist.  States her right hand became numb and was tingling last night.  She continues to wear her bilateral carpal tunnel splints at night.  She says she occasionally has pain in bilateral feet.  States her lower back pain has been improving.  She continues to have some stiffness in her lumbar spine.  She states her fibromyalgia has been well controlled.  She denies any muscle tenderness or muscle aches at this time.  She continues to have significant fatigue but she states her insomnia has been improving.  She states that yesterday she woke up with right groin pain that last until around lunchtime.  She denies any hip or groin pain today.  She states she thinks the pain is due to the way she was positioned while sleeping.  She states her Raynaud's is not as severe as it used to be. She wears gloves regularly. She denies any digital ulcerations.  She states on Sunday she woke up with bilateral eye redness and a  gritty feeling in her eyes.  She used her Xiidra drops more regularly since then and her eye dryness and redness have improved.  Her next appointment is in July 2019.   Activities of Daily Living:  Patient reports morning stiffness for 45  minutes.   Patient Reports nocturnal pain.  Difficulty dressing/grooming: Denies Difficulty climbing stairs: Reports Difficulty getting out of chair: Reports Difficulty using hands for taps, buttons, cutlery, and/or writing: Reports   Review of Systems  Constitutional: Positive for fatigue. Negative for weakness.  HENT: Positive for mouth sores. Negative for trouble swallowing, trouble swallowing, mouth dryness and nose dryness.   Eyes: Positive for dryness. Negative for pain, redness and visual disturbance.  Respiratory: Negative for cough, hemoptysis, shortness of breath and difficulty breathing.   Cardiovascular: Positive for hypertension. Negative for chest pain, palpitations, irregular heartbeat and swelling in legs/feet.  Gastrointestinal: Negative for blood in stool, constipation and diarrhea.  Endocrine: Negative for increased urination.  Genitourinary: Negative for painful urination.  Musculoskeletal: Positive for arthralgias, joint pain, joint swelling and morning stiffness. Negative for myalgias, muscle weakness, muscle tenderness and myalgias.  Skin: Negative for color change, rash, hair loss, nodules/bumps, redness, skin tightness, ulcers and sensitivity to sunlight.  Allergic/Immunologic: Negative for susceptible to infections.  Neurological: Negative for dizziness, numbness and headaches.  Hematological: Negative for swollen glands.  Psychiatric/Behavioral: Negative for depressed mood and sleep disturbance. The patient is not nervous/anxious.     PMFS History:  Patient Active Problem List   Diagnosis Date Noted  . High risk medication use 01/13/2017  . History of iritis 01/13/2017  . Essential hypertension 01/13/2017  . Macular  degeneration 01/13/2017  . 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 Leg weakness 07/08/2014  . Difficulty walking 07/08/2014  . Vaginal dryness   . 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 Raynaud disease   . Fibromyalgia   . Inflammatory arthritis   . Osteopenia     Past Medical History:  Diagnosis Date  . Arthritis   . Fibromyalgia   . Macular degeneration    Left eye  . Osteopenia 12/2014   T score -2.1 FRAX 8.6%/1.1%  . Raynaud disease   . Reiters syndrome   . Sleep disturbance   . Vaginal dryness     Family History  Problem Relation Age of Onset  . Hypertension Sister   . Heart disease Brother   . Hyperlipidemia Brother   . Alzheimer's disease Mother   . Osteoarthritis Mother   . High Cholesterol Sister   . Healthy Son   . Healthy Son    Past Surgical History:  Procedure Laterality Date  . BREAST SURGERY     LEFT BR LUMP-Benign   Social History   Social History Narrative  . Not on file     Objective: Vital Signs: BP 131/85 (BP Location: Left Arm, Patient Position: Sitting, Cuff Size: Normal)   Pulse 99   Resp 16   Ht _0  (1.626 m)   Wt 143 lb 8 oz (65.1 kg)   BMI 24.63 kg/m    Physical Exam  Constitutional: She is oriented to person, place, and time. She appears well-developed and well-nourished.  HENT:  Head: Normocephalic and atraumatic.  Eyes: Conjunctivae and EOM are normal.  Neck: Normal range of motion.  Cardiovascular: Normal rate, regular rhythm, normal heart sounds and intact distal pulses.  Pulmonary/Chest: Effort normal and breath sounds normal.  Abdominal: Soft. Bowel sounds are normal.  Lymphadenopathy:    She has no cervical adenopathy.  Neurological: She is alert and oriented to person, place, and time.  Skin: Skin is warm and dry. Capillary refill takes less than 2 seconds.  Psychiatric: She has a normal mood and affect. Her behavior is normal.  Nursing note and vitals reviewed.    Musculoskeletal  Exam: C-spine, thoracic spine, lumbar spine good range of motion.  No midline spinal tenderness.  No SI joint tenderness.  Shoulder joints, elbow joints, MCPs, PIPs, DIPs good range of motion with no synovitis.  She has tenosynovitis of the right wrist.  Tenderness of left CMC joint.  Hip joints, knee joints, ankle joints, MTPs, PIPs, DIPs good range of motion with no synovitis.  Bilateral knee crepitus.  No warmth or effusion.  She has no tenderness of trochanteric bursa bilaterally.  CDAI Exam: No CDAI exam completed.    Investigation: No additional findings. CBC Latest Ref Rng & Units 09/26/2017 06/07/2017 01/17/2017  WBC 3.4 - 10.8 x10E3/uL 8.1 4.7 5.2  Hemoglobin 11.1 - 15.9 g/dL 12.7 13.0 13.5  Hematocrit 34.0 - 46.6 % 39.2 40.6 41.7  Platelets 150 - 379 x10E3/uL 358 287 300   CMP Latest Ref Rng & Units 09/26/2017 06/07/2017 01/17/2017  Glucose 65 - 99 mg/dL 118(H) 95  116(H)  BUN 8 - 27 mg/dL _0 Creatinine 0.57 - 1.00 mg/dL 0.89 0.77 0.83  Sodium 134 - 144 mmol/L 141 141 142  Potassium 3.5 - 5.2 mmol/L 4.0 4.3 4.0  Chloride 96 - 106 mmol/L 102 100 101  CO2 20 - 29 mmol/L _1 Calcium 8.7 - 10.3 mg/dL 9.0 9.5 9.2  Total Protein 6.0 - 8.5 g/dL 6.6 6.9 6.5  Total Bilirubin 0.0 - 1.2 mg/dL <0.2 <0.2 <0.2  Alkaline Phos 39 - 117 IU/L 105 92 81  AST 0 - 40 IU/L 21 29 32  ALT 0 - 32 IU/L _2 Imaging: Mm Screening Breast Tomo Bilateral  Result Date: 11/16/2017 CLINICAL DATA:  Screening. EXAM: 2D DIGITAL SCREENING BILATERAL MAMMOGRAM WITH 3D TOMO WITH CAD COMPARISON:  Previous exam(s). ACR Breast Density Category c: The breast tissue is heterogeneously dense, which may obscure small masses. FINDINGS: There are no findings suspicious for malignancy. Images were processed with CAD. IMPRESSION: No mammographic evidence of malignancy. A result letter of this screening mammogram will be mailed directly to the patient. RECOMMENDATION: Screening mammogram in one year.  (Code:SM-B-01Y) BI-RADS CATEGORY  1: Negative. Electronically Signed   By: Franki Cabot M.D.   On: 11/16/2017 07:56    Speciality Comments: No specialty comments available.    Procedures:  No procedures performed Allergies: Patient has no known allergies.   Assessment / Plan:     Visit Diagnoses: Inflammatory arthritis: She has tenosynovitis of the right wrist.  She has tenderness of the left CMC joint.  She likely has left de Quervain's due to overuse activities.  She is going to continue to wear her bilateral carpal tunnel braces at night.  She was given a prescription for left [facility] joint brace that she can wear during the day.  She is also given a prescription for Voltaren gel which she can use topically in this region.  She will continue on sulfasalazine 500 mg 2 tablets p.o. twice daily.  She would not like to consider adding any other medications at this time.  She tried methotrexate in the past but experienced increased fatigue. Refills of Fabb, SSZ, and Celebrex were sent to the pharmacy.   High risk medication use -  Sulfasalazine 500 mg 2 tabs po bid, Celebrex 251m po qd. CBC/CMP: 09/26/17.  She was given an order for CBC and CMP that she will take [facility] to [facility]- Plan: CMP14+EGFR, CBC with Differential/Platelet, CBC with Differential/Platelet, CMP14+EGFR, CBC with Differential/Platelet  History of iritis: She had an episode 4 days ago where she experienced increased bilateral eye redness and eye dryness.  She has been using his eyedrops more frequently.  Her dryness and eye redness have improved.  She has a follow-up visit with her ophthalmologist in July 2019.  Raynaud's disease without gangrene: improved.  No signs of digital ulcerations or gangrene.  She wears gloves on a regular basis.  Fibromyalgia: Her myalgia has been well controlled.  She has no muscle tenderness or muscle tension on exam.  Osteopenia of multiple sites - -2.1 lowest measured T score, AP Lumbar spine  March 2016 DEXA ordered by Dr FPhineas Real Spondylosis of lumbar region without myelopathy or radiculopathy: Improved.  She has no midline spinal tenderness or radiation of pain.  She reports stiffness in the mornings.   Other medical conditions are listed as follows:  Essential hypertension  History of macular degeneration    Orders: Orders Placed This  Encounter  Procedures  . CMP14+EGFR  . CBC with Differential/Platelet  . CBC with Differential/Platelet   Meds ordered this encounter  Medications  . diclofenac sodium (VOLTAREN) 1 % GEL    Sig: Apply 3 grams to 3 large joints up to 3 times daily    Dispense:  3 Tube    Refill:  3    Face-to-face time spent with patient was 79mnutes. >50% of time was spent in counseling and coordination of care.  Follow-Up Instructions: Return in about 5 months (around 05/17/2018) for Inflammatory arthritis .   TOfilia Neas PA-C   I examined and evaluated the patient with THazel SamsPA. The plan of care was discussed as noted above.  SBo Merino MD  Note - This record has been created using DEditor, commissioning  Chart creation errors have been sought, but may not always  have been located. Such creation errors do not reflect on  the standard of medical care.

## 2017-12-15 ENCOUNTER — Ambulatory Visit: Payer: 59 | Admitting: Rheumatology

## 2017-12-15 ENCOUNTER — Ambulatory Visit: Payer: 59 | Admitting: Physician Assistant

## 2017-12-15 ENCOUNTER — Encounter: Payer: Self-pay | Admitting: Physician Assistant

## 2017-12-15 VITALS — BP 131/85 | HR 99 | Resp 16 | Ht 64.0 in | Wt 143.5 lb

## 2017-12-15 DIAGNOSIS — Z8669 Personal history of other diseases of the nervous system and sense organs: Secondary | ICD-10-CM | POA: Diagnosis not present

## 2017-12-15 DIAGNOSIS — M8589 Other specified disorders of bone density and structure, multiple sites: Secondary | ICD-10-CM

## 2017-12-15 DIAGNOSIS — M47816 Spondylosis without myelopathy or radiculopathy, lumbar region: Secondary | ICD-10-CM

## 2017-12-15 DIAGNOSIS — M797 Fibromyalgia: Secondary | ICD-10-CM

## 2017-12-15 DIAGNOSIS — I7300 Raynaud's syndrome without gangrene: Secondary | ICD-10-CM

## 2017-12-15 DIAGNOSIS — M1990 Unspecified osteoarthritis, unspecified site: Secondary | ICD-10-CM

## 2017-12-15 DIAGNOSIS — I10 Essential (primary) hypertension: Secondary | ICD-10-CM

## 2017-12-15 DIAGNOSIS — Z79899 Other long term (current) drug therapy: Secondary | ICD-10-CM | POA: Diagnosis not present

## 2017-12-15 MED ORDER — SULFASALAZINE 500 MG PO TBEC: DELAYED_RELEASE_TABLET | ORAL | 0 refills | Status: DC

## 2017-12-15 MED ORDER — CELECOXIB 200 MG PO CAPS: ORAL_CAPSULE | ORAL | 0 refills | Status: DC

## 2017-12-15 MED ORDER — DICLOFENAC SODIUM 1 % TD GEL: TRANSDERMAL | 3 refills | Status: DC

## 2017-12-15 MED ORDER — FOLIC ACID-VIT B6-VIT B12 2.2-25-1 MG PO TABS: 1.0000 | ORAL_TABLET | Freq: Every day | ORAL | 0 refills | Status: DC

## 2017-12-15 NOTE — Patient Instructions (Signed)
Natural anti-inflammatories  You can purchase these at [employer], [employer] or online.  . Turmeric (capsules)  . Ginger (ginger root or capsules)  . Omega 3 (Fish, flax seeds, chia seeds, walnuts, almonds)  . Tart cherry (dried or extract)   Patient should be under the care of a physician while taking these supplements. This may not be reproduced without the permission of Dr. Shaili Deveshwar.  

## 2017-12-26 ENCOUNTER — Encounter: Payer: Self-pay | Admitting: Gynecology

## 2017-12-26 ENCOUNTER — Ambulatory Visit: Payer: 59 | Admitting: Gynecology

## 2017-12-26 VITALS — BP 124/74 | Ht 65.0 in | Wt 144.0 lb

## 2017-12-26 DIAGNOSIS — Z1151 Encounter for screening for human papillomavirus (HPV): Secondary | ICD-10-CM | POA: Diagnosis not present

## 2017-12-26 DIAGNOSIS — Z01411 Encounter for gynecological examination (general) (routine) with abnormal findings: Secondary | ICD-10-CM

## 2017-12-26 DIAGNOSIS — M8580 Other specified disorders of bone density and structure, unspecified site: Secondary | ICD-10-CM | POA: Diagnosis not present

## 2017-12-26 DIAGNOSIS — N952 Postmenopausal atrophic vaginitis: Secondary | ICD-10-CM

## 2017-12-26 MED ORDER — ESTRADIOL 10 MCG VA TABS: 1.0000 | ORAL_TABLET | VAGINAL | 12 refills | Status: DC

## 2017-12-26 NOTE — Addendum Note (Signed)
Addended by: Nelva Nay on: 12/26/2017 03:18 PM   Modules accepted: Orders

## 2017-12-26 NOTE — Patient Instructions (Signed)
Follow-up in 1 year for annual exam, sooner if any issues. 

## 2017-12-26 NOTE — Progress Notes (Signed)
    Monica [name] Jan 07, 1956 [id number]        62 y.o.  [id number] for annual gynecologic exam.  Doing well without gynecologic complaints.  Past medical history,surgical history, problem list, medications, allergies, family history and social history were all reviewed and documented as reviewed in the EPIC chart.  ROS:  Performed with pertinent positives and negatives included in the history, assessment and plan.   Additional significant findings : None   Exam: [name] assistant Vitals:   12/26/17 1429  BP: 124/74  Weight: 144 lb (65.3 kg)  Height: 5\' 5"  (1.651 m)   Body mass index is 23.96 kg/m.  General appearance:  Normal affect, orientation and appearance. Skin: Grossly normal HEENT: Without gross lesions.  No cervical or supraclavicular adenopathy. Thyroid normal.  Lungs:  Clear without wheezing, rales or rhonchi Cardiac: RR, without RMG Abdominal:  Soft, nontender, without masses, guarding, rebound, organomegaly or hernia Breasts:  Examined lying and sitting without masses, retractions, discharge or axillary adenopathy. Pelvic:  Ext, BUS, Vagina: With atrophic changes  Cervix: With atrophic changes.  Pap smear/HPV  Uterus: Anteverted, normal size, shape and contour, midline and mobile nontender   Adnexa: Without masses or tenderness    Anus and perineum: Normal   Rectovaginal: Normal sphincter tone without palpated masses or tenderness.    Assessment/Plan:  62 y.o. [id number] female for annual gynecologic exam.   1. Postmenopausal/atrophic genital changes.  Using Vagifem 10 mcg twice weekly doing well with this.  We again reviewed risks of absorption and systemic effects to include breast, thrombosis and endometrial stimulation.  She is comfortable with this and refill times 1 year provided.  Otherwise doing well without significant hot flushes sweats or any vaginal bleeding. 2. Osteopenia.  DEXA 2016 T score -2.1 FRAX 8.6% / 1.1%.  Was to do DEXA last year but did  not schedule this.  Has scheduled now and will follow-up for this.  Check vitamin D level, TSH. 3. Mammography 11/2017.  Continue with annual mammography next year.  Breast exam normal today. 4. Due for colonoscopy now and patient is going to call and schedule. 5. Pap smear 2016.  Pap smear/HPV today.  No history of significant abnormal Pap smears. 6. Health maintenance.  Has CBC and comprehensive metabolic panel checked at her rheumatologist's office.  Written prescription to add fasting lipid profile, TSH and vitamin D to the blood work given.  Patient knows to call after having drawn to make sure I see the results.  Follow-up in 1 year, sooner as needed.   [name] MD, 3:09 PM 12/26/2017

## 2017-12-29 LAB — PAP IG AND HPV HIGH-RISK: HPV DNA HIGH RISK: NOT DETECTED

## 2018-01-03 ENCOUNTER — Other Ambulatory Visit: Payer: Self-pay | Admitting: Gynecology

## 2018-01-04 ENCOUNTER — Encounter: Payer: Self-pay | Admitting: Gynecology

## 2018-01-04 LAB — CBC WITH DIFFERENTIAL/PLATELET
BASOS: 0 %
Basophils Absolute: 0 10*3/uL (ref 0.0–0.2)
EOS (ABSOLUTE): 0.1 10*3/uL (ref 0.0–0.4)
Eos: 2 %
Hematocrit: 40.5 % (ref 34.0–46.6)
Hemoglobin: 13.3 g/dL (ref 11.1–15.9)
IMMATURE GRANS (ABS): 0 10*3/uL (ref 0.0–0.1)
IMMATURE GRANULOCYTES: 0 %
Lymphocytes Absolute: 1.1 10*3/uL (ref 0.7–3.1)
Lymphs: 27 %
MCH: 29.2 pg (ref 26.6–33.0)
MCHC: 32.8 g/dL (ref 31.5–35.7)
MCV: 89 fL (ref 79–97)
Monocytes Absolute: 0.5 10*3/uL (ref 0.1–0.9)
Monocytes: 11 %
NEUTROS PCT: 60 %
Neutrophils Absolute: 2.5 10*3/uL (ref 1.4–7.0)
PLATELETS: 327 10*3/uL (ref 150–379)
RBC: 4.56 x10E6/uL (ref 3.77–5.28)
RDW: 13.6 % (ref 12.3–15.4)
WBC: 4.2 10*3/uL (ref 3.4–10.8)

## 2018-01-04 LAB — CMP14+EGFR
A/G RATIO: 1.5 (ref 1.2–2.2)
ALT: 21 IU/L (ref 0–32)
AST: 23 IU/L (ref 0–40)
Albumin: 4 g/dL (ref 3.6–4.8)
Alkaline Phosphatase: 92 IU/L (ref 39–117)
BUN/Creatinine Ratio: 19 (ref 12–28)
BUN: 15 mg/dL (ref 8–27)
Bilirubin Total: 0.3 mg/dL (ref 0.0–1.2)
CALCIUM: 9.2 mg/dL (ref 8.7–10.3)
CO2: 22 mmol/L (ref 20–29)
Chloride: 102 mmol/L (ref 96–106)
Creatinine, Ser: 0.78 mg/dL (ref 0.57–1.00)
GFR calc Af Amer: 95 mL/min/{1.73_m2} (ref >59)
GFR, EST NON AFRICAN AMERICAN: 82 mL/min/{1.73_m2} (ref >59)
Globulin, Total: 2.6 g/dL (ref 1.5–4.5)
Glucose: 87 mg/dL (ref 65–99)
POTASSIUM: 4.8 mmol/L (ref 3.5–5.2)
Sodium: 139 mmol/L (ref 134–144)
Total Protein: 6.6 g/dL (ref 6.0–8.5)

## 2018-01-04 LAB — LIPID PANEL W/O CHOL/HDL RATIO
Cholesterol, Total: 204 mg/dL — ABNORMAL HIGH (ref 100–199)
HDL: 51 mg/dL (ref >39)
LDL Calculated: 134 mg/dL — ABNORMAL HIGH (ref 0–99)
TRIGLYCERIDES: 97 mg/dL (ref 0–149)
VLDL Cholesterol Cal: 19 mg/dL (ref 5–40)

## 2018-01-04 LAB — VITAMIN D 25 HYDROXY (VIT D DEFICIENCY, FRACTURES): VIT D 25 HYDROXY: 31.6 ng/mL (ref 30.0–100.0)

## 2018-01-04 LAB — TSH: TSH: 3.98 u[IU]/mL (ref 0.450–4.500)

## 2018-01-04 NOTE — Progress Notes (Signed)
Labs are WNL.

## 2018-01-16 ENCOUNTER — Encounter (HOSPITAL_COMMUNITY): Payer: 59 | Admitting: Physical Therapy

## 2018-01-19 ENCOUNTER — Encounter (HOSPITAL_COMMUNITY): Payer: 59 | Admitting: Physical Therapy

## 2018-01-24 ENCOUNTER — Encounter: Payer: Self-pay | Admitting: Rheumatology

## 2018-01-25 ENCOUNTER — Ambulatory Visit: Payer: 59 | Admitting: Rheumatology

## 2018-01-25 ENCOUNTER — Telehealth: Payer: Self-pay | Admitting: *Deleted

## 2018-01-25 ENCOUNTER — Encounter: Payer: Self-pay | Admitting: Physician Assistant

## 2018-01-25 ENCOUNTER — Ambulatory Visit (INDEPENDENT_AMBULATORY_CARE_PROVIDER_SITE_OTHER): Payer: Self-pay

## 2018-01-25 ENCOUNTER — Telehealth: Payer: Self-pay | Admitting: Rheumatology

## 2018-01-25 VITALS — BP 142/89 | HR 91 | Resp 14 | Ht 64.0 in | Wt 141.0 lb

## 2018-01-25 DIAGNOSIS — M533 Sacrococcygeal disorders, not elsewhere classified: Secondary | ICD-10-CM | POA: Diagnosis not present

## 2018-01-25 DIAGNOSIS — M47816 Spondylosis without myelopathy or radiculopathy, lumbar region: Secondary | ICD-10-CM

## 2018-01-25 DIAGNOSIS — I10 Essential (primary) hypertension: Secondary | ICD-10-CM | POA: Diagnosis not present

## 2018-01-25 DIAGNOSIS — I7300 Raynaud's syndrome without gangrene: Secondary | ICD-10-CM

## 2018-01-25 DIAGNOSIS — M79641 Pain in right hand: Secondary | ICD-10-CM | POA: Diagnosis not present

## 2018-01-25 DIAGNOSIS — G8929 Other chronic pain: Secondary | ICD-10-CM

## 2018-01-25 DIAGNOSIS — Z79899 Other long term (current) drug therapy: Secondary | ICD-10-CM | POA: Diagnosis not present

## 2018-01-25 DIAGNOSIS — M1990 Unspecified osteoarthritis, unspecified site: Secondary | ICD-10-CM

## 2018-01-25 DIAGNOSIS — M8589 Other specified disorders of bone density and structure, multiple sites: Secondary | ICD-10-CM | POA: Diagnosis not present

## 2018-01-25 DIAGNOSIS — M797 Fibromyalgia: Secondary | ICD-10-CM | POA: Diagnosis not present

## 2018-01-25 DIAGNOSIS — M542 Cervicalgia: Secondary | ICD-10-CM | POA: Diagnosis not present

## 2018-01-25 DIAGNOSIS — Z8669 Personal history of other diseases of the nervous system and sense organs: Secondary | ICD-10-CM | POA: Diagnosis not present

## 2018-01-25 DIAGNOSIS — M79642 Pain in left hand: Secondary | ICD-10-CM

## 2018-01-25 DIAGNOSIS — M1380 Other specified arthritis, unspecified site: Secondary | ICD-10-CM

## 2018-01-25 NOTE — Telephone Encounter (Signed)
spoke  with patient and schedule her an appointment today

## 2018-01-25 NOTE — Patient Instructions (Signed)
Adalimumab Injection What is this medicine? ADALIMUMAB (a dal AYE mu mab) is used to treat rheumatoid and psoriatic arthritis. It is also used to treat ankylosing spondylitis, Crohn's disease, ulcerative colitis, plaque psoriasis, hidradenitis suppurativa, and uveitis. This medicine may be used for other purposes; ask your health care provider or pharmacist if you have questions. COMMON BRAND NAME(S): CYLTEZO, Humira What should I tell my health care provider before I take this medicine? They need to know if you have any of these conditions: -diabetes -heart disease -hepatitis B or history of hepatitis B infection -immune system problems -infection or history of infections -multiple sclerosis -recently received or scheduled to receive a vaccine -scheduled to have surgery -tuberculosis, a positive skin test for tuberculosis or have recently been in close contact with someone who has tuberculosis -an unusual reaction to adalimumab, other medicines, mannitol, latex, rubber, foods, dyes, or preservatives -pregnant or trying to get pregnant -breast-feeding How should I use this medicine? This medicine is for injection under the skin. You will be taught how to prepare and give this medicine. Use exactly as directed. Take your medicine at regular intervals. Do not take your medicine more often than directed. A special MedGuide will be given to you by the pharmacist with each prescription and refill. Be sure to read this information carefully each time. It is important that you put your used needles and syringes in a special sharps container. Do not put them in a trash can. If you do not have a sharps container, call your pharmacist or healthcare provider to get one. Talk to your pediatrician regarding the use of this medicine in children. While this drug may be prescribed for children as young as 2 years for selected conditions, precautions do apply. The manufacturer of the medicine offers free  information to patients and their health care partners. Call 1-800-448-6472 for more information. Overdosage: If you think you have taken too much of this medicine contact a poison control center or emergency room at once. NOTE: This medicine is only for you. Do not share this medicine with others. What if I miss a dose? If you miss a dose, take it as soon as you can. If it is almost time for your next dose, take only that dose. Do not take double or extra doses. Give the next dose when your next scheduled dose is due. Call your doctor or health care professional if you are not sure how to handle a missed dose. What may interact with this medicine? Do not take this medicine with any of the following medications: -abatacept -anakinra -etanercept -infliximab -live virus vaccines -rilonacept This medicine may also interact with the following medications: -vaccines This list may not describe all possible interactions. Give your health care provider a list of all the medicines, herbs, non-prescription drugs, or dietary supplements you use. Also tell them if you smoke, drink alcohol, or use illegal drugs. Some items may interact with your medicine. What should I watch for while using this medicine? Visit your doctor or health care professional for regular checks on your progress. Tell your doctor or healthcare professional if your symptoms do not start to get better or if they get worse. You will be tested for tuberculosis (TB) before you start this medicine. If your doctor prescribes any medicine for TB, you should start taking the TB medicine before starting this medicine. Make sure to finish the full course of TB medicine. Call your doctor or health care professional if you get a cold   or other infection while receiving this medicine. Do not treat yourself. This medicine may decrease your body's ability to fight infection. Talk to your doctor about your risk of cancer. You may be more at risk for  certain types of cancers if you take this medicine. What side effects may I notice from receiving this medicine? Side effects that you should report to your doctor or health care professional as soon as possible: -allergic reactions like skin rash, itching or hives, swelling of the face, lips, or tongue -breathing problems -changes in vision -chest pain -fever, chills, or any other sign of infection -numbness or tingling -red, scaly patches or raised bumps on the skin -swelling of the ankles -swollen lymph nodes in the neck, underarm, or groin areas -unexplained weight loss -unusual bleeding or bruising -unusually weak or tired Side effects that usually do not require medical attention (report to your doctor or health care professional if they continue or are bothersome): -headache -nausea -redness, itching, swelling, or bruising at site where injected This list may not describe all possible side effects. Call your doctor for medical advice about side effects. You may report side effects to FDA at 1-800-FDA-1088. Where should I keep my medicine? Keep out of the reach of children. Store in the original container and in the refrigerator between 2 and 8 degrees C (36 and 46 degrees F). Do not freeze. The product may be stored in a cool carrier with an ice pack, if needed. Protect from light. Throw away any unused medicine after the expiration date. NOTE: This sheet is a summary. It may not cover all possible information. If you have questions about this medicine, talk to your doctor, pharmacist, or health care provider.  2018 Elsevier/Gold Standard (2015-04-16 11:11:43)  

## 2018-01-25 NOTE — Progress Notes (Signed)
Office Visit Note  Patient: Monica [name]             Date of Birth: [birth year]           MRN: [id number]             PCP: Lemmie Evens, MD Referring: Lemmie Evens, MD Visit Date: 01/25/2018 Occupation: '@GUAROCC'$ @    Subjective:  Other of the Neck (Stiffness) and Edema and Pain of the Right Wrist   History of Present Illness: Monica [name] is a 62 y.o. female with history of inflammatory arthritis.  She states she had a flare with increased pain and stiffness in her C-spine yesterday.  She tried Celebrex without much relief.  She woke up with right wrist joint pain swelling and redness today.  She has been having intermittent flares of arthritis and tenosynovitis in her wrist joints off and on.  She had 2 bouts of diarrhea and vomiting one was in February and another one in March.  She denies any recent episodes of iritis although her eyes have been red in the last 2 days.  The cold temperature makes her fingers cold.  Activities of Daily Living:  Patient reports morning stiffness for 5 minute.   Patient Reports nocturnal pain.  back pain Difficulty dressing/grooming: Denies Difficulty climbing stairs: Denies Difficulty getting out of chair: Reports Difficulty using hands for taps, buttons, cutlery, and/or writing: Denies   Review of Systems  Constitutional: Negative for fatigue, night sweats, weight gain and weight loss.  HENT: Negative for mouth sores, trouble swallowing, trouble swallowing, mouth dryness and nose dryness.   Eyes: Positive for redness and dryness. Negative for pain and visual disturbance.  Respiratory: Positive for shortness of breath. Negative for cough and difficulty breathing.   Cardiovascular: Negative for chest pain, palpitations, hypertension, irregular heartbeat and swelling in legs/feet.  Gastrointestinal: Negative for blood in stool, constipation and diarrhea.  Endocrine: Negative for increased urination.  Genitourinary: Negative for vaginal  dryness.  Musculoskeletal: Positive for arthralgias, joint pain, joint swelling and morning stiffness. Negative for myalgias, muscle weakness, muscle tenderness and myalgias.  Skin: Positive for color change. Negative for rash, hair loss, skin tightness, ulcers and sensitivity to sunlight.  Allergic/Immunologic: Negative for susceptible to infections.  Neurological: Negative for dizziness, memory loss, night sweats and weakness.  Hematological: Negative for swollen glands.  Psychiatric/Behavioral: Positive for sleep disturbance. Negative for depressed mood. The patient is not nervous/anxious.     PMFS History:  Patient Active Problem List   Diagnosis Date Noted  . High risk medication use 01/13/2017  . History of iritis 01/13/2017  . Essential hypertension 01/13/2017  . Macular degeneration 01/13/2017  . 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 Leg weakness 07/08/2014  . Difficulty walking 07/08/2014  . Vaginal dryness   . 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 Raynaud disease   . Fibromyalgia   . Inflammatory arthritis   . Osteopenia     Past Medical History:  Diagnosis Date  . Arthritis   . Fibromyalgia   . Macular degeneration    Left eye  . Osteopenia 12/2014   T score -2.1 FRAX 8.6%/1.1%  . Raynaud disease   . Reiters syndrome   . Sleep disturbance   . Vaginal dryness     Family History  Problem Relation Age of Onset  . Hypertension Sister   . Heart disease Brother   . Hyperlipidemia Brother   . Alzheimer's disease Mother   . Osteoarthritis  Mother   . High Cholesterol Sister   . Healthy Son   . Healthy Son    Past Surgical History:  Procedure Laterality Date  . BREAST SURGERY     LEFT BR LUMP-Benign   Social History   Social History Narrative  . Not on file     Objective: Vital Signs: BP (!) 142/89 (BP Location: Left Arm, Patient Position: Sitting, Cuff Size: Small)   Pulse 91   Resp 14   Ht '5\' 4"'$  (1.626 m)   Wt 141 lb  (64 kg)   BMI 24.20 kg/m    Physical Exam  Constitutional: She is oriented to person, place, and time. She appears well-developed and well-nourished.  HENT:  Head: Normocephalic and atraumatic.  Eyes: Conjunctivae and EOM are normal.  Neck: Normal range of motion.  Cardiovascular: Normal rate, regular rhythm, normal heart sounds and intact distal pulses.  Pulmonary/Chest: Effort normal and breath sounds normal.  Abdominal: Soft. Bowel sounds are normal.  Lymphadenopathy:    She has no cervical adenopathy.  Neurological: She is alert and oriented to person, place, and time.  Skin: Skin is warm and dry. Capillary refill takes less than 2 seconds.  Psychiatric: She has a normal mood and affect. Her behavior is normal.  Nursing note and vitals reviewed.    Musculoskeletal Exam: She has limited range of motion of her C-spine with stiffness.  Shoulder joints elbow joints with good range of motion.  She is synovitis over the right wrist joint with limited range of motion.  She has some tenosynovitis in the left wrist flexor tendons.  Hip joints: Good range of motion.  She is tenderness over left SI joint.  Knee joints ankles MTPs.  Good range of motion with no synovitis.  CDAI Exam: CDAI Homunculus Exam:   Tenderness:  RUE: wrist  Swelling:  RUE: wrist  Joint Counts:  CDAI Tender Joint count: 1 CDAI Swollen Joint count: 1  Global Assessments:  Patient Global Assessment: 4 Provider Global Assessment: 4  CDAI Calculated Score: 10    Investigation: No additional findings. CBC Latest Ref Rng & Units 01/03/2018 09/26/2017 06/07/2017  WBC 3.4 - 10.8 x10E3/uL 4.2 8.1 4.7  Hemoglobin 11.1 - 15.9 g/dL 13.3 12.7 13.0  Hematocrit 34.0 - 46.6 % 40.5 39.2 40.6  Platelets 150 - 379 x10E3/uL 327 358 287   CMP Latest Ref Rng & Units 01/03/2018 09/26/2017 06/07/2017  Glucose 65 - 99 mg/dL 87 118(H) 95  BUN 8 - 27 mg/dL '15 13 12  '$ Creatinine 0.57 - 1.00 mg/dL 0.78 0.89 0.77  Sodium 134 -  144 mmol/L 139 141 141  Potassium 3.5 - 5.2 mmol/L 4.8 4.0 4.3  Chloride 96 - 106 mmol/L 102 102 100  CO2 20 - 29 mmol/L '22 25 23  '$ Calcium 8.7 - 10.3 mg/dL 9.2 9.0 9.5  Total Protein 6.0 - 8.5 g/dL 6.6 6.6 6.9  Total Bilirubin 0.0 - 1.2 mg/dL 0.3 <0.2 <0.2  Alkaline Phos 39 - 117 IU/L 92 105 92  AST 0 - 40 IU/L '23 21 29  '$ ALT 0 - 32 IU/L '21 20 23    '$ Imaging: Xr Cervical Spine 2 Or 3 Views  Result Date: 01/25/2018 C5-6 and C6-7 narrowing was noted.  Facet joint arthropathy was noted. Impression: These findings are consistent with spondylosis and facet joint arthropathy.  Xr Hand 2 View Left  Result Date: 01/25/2018 PIP and DIP narrowing was noted.  First CMC narrowing was noted.  No erosive changes were noted.  Impression: Findings are consistent with mild osteoarthritis.  Xr Hand 2 View Right  Result Date: 01/25/2018 PIP, DIP, CMC narrowing was noted.  No MCP, intercarpal radiocarpal joint space narrowing was noted.  No erosive changes were noted. Impression: These findings are consistent with osteoarthritis.  Xr Pelvis 1-2 Views  Result Date: 01/25/2018 Sclerosis was noted. Impression: Unremarkable x-ray of SI joints.   Speciality Comments: No specialty comments available.    Procedures:  No procedures performed Allergies: Patient has no known allergies.   Assessment / Plan:     Visit Diagnoses: Inflammatory arthritis -patient has been having increased pain and discomfort in multiple joints.  She had warmth and swelling in the right wrist joint.  She has been having increased neck stiffness and left SI joint pain.  She is doing quite well on sulfasalazine for a long time.  She had intermittent tenosynovitis in her hands.  She is also had knee joint effusions in the beginning when she presented with inflammatory arthritis.  She was treated with methotrexate in the past but it was discontinued due to GI intolerance.  I discussed the option of possibly using handout was given for  her to review.  I will also obtain following labs today.  High risk medication use - Sulfasalazine 500 mg 2 tabs po bid, Celebrex '200mg'$  po qd - Plan: Hepatitis B core antibody, IgM, Hepatitis B surface antigen, Hepatitis C antibody, HIV antibody, QuantiFERON-TB Gold Plus, Serum protein electrophoresis with reflex, IgG, IgA, IgM,    History of iritis-she has not had recent flare of iritis.  Neck pain - Plan: XR Cervical Spine 2 or 3 views.  Her C-spine x-rays showed multilevel spondylosis with narrowing at C5-6 and C6-7 facet joint arthropathy.  Pain in both hands - Plan: XR Hand 2 View Right, XR Hand 2 View Left, x-ray of bilateral hands were unremarkable except for mild osteoarthritic changes.  She did have inflammatory arthritis in her right wrist joint.  She also has persistent tenosynovitis in her bilateral wrists.  Will obtain the following labs today rheumatoid factor, Cyclic citrul peptide antibody, IgG, Sedimentation rate.  Chronic SI joint pain - Plan: XR Pelvis 1-2 Views, x-ray of the pelvis was unremarkable.  I will obtain, HLA-B27 antigen  Raynaud's disease without gangrene-especially with the cold weather.  Fibromyalgia-she continues to have some generalized pain.  Osteopenia of multiple sites-  Spondylosis of lumbar region without myelopathy or radiculopathy-  Essential hypertension  History of macular degeneration    Orders: Orders Placed This Encounter  Procedures  . XR Hand 2 View Right  . XR Hand 2 View Left  . XR Pelvis 1-2 Views  . XR Cervical Spine 2 or 3 views  . Angiotensin converting enzyme  . HLA-B27 antigen  . Hepatitis B core antibody, IgM  . Hepatitis B surface antigen  . Hepatitis C antibody  . HIV antibody  . QuantiFERON-TB Gold Plus  . Serum protein electrophoresis with reflex  . IgG, IgA, IgM  . Rheumatoid factor  . Cyclic citrul peptide antibody, IgG  . Sedimentation rate   No orders of the defined types were placed in this  encounter.   Face-to-face time spent with patient was 30 minutes. >50% of time was spent in counseling and coordination of care.  Follow-Up Instructions: No follow-ups on file.   Bo Merino, MD  Note - This record has been created using Editor, commissioning.  Chart creation errors have been sought, but may not always  have been located. Such  creation errors do not reflect on  the standard of medical care.

## 2018-01-25 NOTE — Telephone Encounter (Signed)
Attempted to contact the patient and left message for patient to call the office.  

## 2018-01-25 NOTE — Telephone Encounter (Signed)
Patient left a voicemail stating that she was returning your call. °

## 2018-01-26 ENCOUNTER — Other Ambulatory Visit: Payer: Self-pay | Admitting: Physician Assistant

## 2018-01-26 ENCOUNTER — Telehealth: Payer: Self-pay | Admitting: Physician Assistant

## 2018-01-26 NOTE — Telephone Encounter (Signed)
Patient has a new diagnosis of seropositive rheumatoid arthritis.  She is currently taking Sulfasalazine 500 mg 2 tablets BID.  She had GI intolerance to MTX.  Please apply for Humira 40 mg/0.4 ml PNKT every 14 days.    Thanks!

## 2018-01-26 NOTE — Telephone Encounter (Signed)
Attempted to contact patient and left message for patient to call the office.  

## 2018-01-26 NOTE — Telephone Encounter (Signed)
Submitted a prior authorization for Humira to pts insurance via cover my meds. Received a response from insurance stating that [employer] [place] unable to respond with clinical questions. Called insurance to clarify. Spoke with Corrine who states that a PA for Humira is not required with pts plan. She must use [facility].   Reference number: [id number] Phone: [phone]  Called pt to update. She will have to be consented on medication. We will send her Rx to [facility]. Her first fill will come to the clinic. Once received, we will schedule a nursing visit for administration. Gave her information about co-pay assistance to use towards her co-pay. Patient voices understanding and denies any questions at this time.   Please send Rx for Humira to [facility]. Call pt to discuss lab results and schedule a time to be consented on mediation. Thanks!  Alton Bouknight, [place], CPhT 9:52 AM

## 2018-01-27 ENCOUNTER — Encounter: Payer: Self-pay | Admitting: Rheumatology

## 2018-01-30 ENCOUNTER — Telehealth: Payer: Self-pay | Admitting: Rheumatology

## 2018-01-30 MED ORDER — ADALIMUMAB 40 MG/0.4ML ~~LOC~~ AJKT: 40.0000 mg | AUTO-INJECTOR | SUBCUTANEOUS | 0 refills | Status: DC

## 2018-01-30 NOTE — Telephone Encounter (Signed)
Patient left a voicemail stating she received a call from our office stating that she needed to sign a release for medication form.  Patient wants to know if the release has to be signed before before the medication is ordered or can she sign it the same day she gets the injection.  Patient is asking so she doesn't have to make two separate trips to the office.

## 2018-01-30 NOTE — Telephone Encounter (Signed)
Last Visit: 01/25/18 Next Visit: 05/17/18 Labs: 01/03/18 WNL  Okay to refill per Dr. Estanislado Pandy

## 2018-01-30 NOTE — Progress Notes (Signed)
RF positive.  CCP was not completed by [facility].  Please inform her that her clinical features and positive RF are consistent with rheumatoid arthritis.  She is currently on SSZ.  We would like to apply for Humira if she will return to sign the consent form. We discussed the indications, contraindications, and side effects of Humira at her last visit.

## 2018-01-30 NOTE — Telephone Encounter (Signed)
Prescription sent to the pharmacy. Patient advised prescription sent to the pharmacy. Patient advised she may consent when she comes in for her nurse visit.

## 2018-01-31 LAB — HEPATITIS B SURFACE ANTIGEN: Hepatitis B Surface Ag: NEGATIVE

## 2018-01-31 LAB — SEDIMENTATION RATE: Sed Rate: 18 mm/hr (ref 0–40)

## 2018-01-31 LAB — QUANTIFERON-TB GOLD PLUS
QUANTIFERON NIL VALUE: 0.04 [IU]/mL
QUANTIFERON TB2 AG VALUE: 0.02 [IU]/mL
QuantiFERON Mitogen Value: >10 IU/mL
QuantiFERON TB1 Ag Value: 0.03 IU/mL
QuantiFERON-TB Gold Plus: NEGATIVE

## 2018-01-31 LAB — PROTEIN ELECTROPHORESIS, SERUM, WITH REFLEX
A/G RATIO SPE: 1.2 (ref 0.7–1.7)
Albumin ELP: 3.8 g/dL (ref 2.9–4.4)
Alpha 1: 0.2 g/dL (ref 0.0–0.4)
Alpha 2: 0.8 g/dL (ref 0.4–1.0)
Beta: 1 g/dL (ref 0.7–1.3)
Gamma Globulin: 1.1 g/dL (ref 0.4–1.8)
Globulin, Total: 3.1 g/dL (ref 2.2–3.9)
TOTAL PROTEIN: 6.9 g/dL (ref 6.0–8.5)

## 2018-01-31 LAB — HIV ANTIBODY (ROUTINE TESTING W REFLEX): HIV Screen 4th Generation wRfx: NONREACTIVE

## 2018-01-31 LAB — IGG, IGA, IGM
IGG (IMMUNOGLOBIN G), SERUM: 1152 mg/dL (ref 700–1600)
IgA/Immunoglobulin A, Serum: 117 mg/dL (ref 87–352)
IgM (Immunoglobulin M), Srm: 57 mg/dL (ref 26–217)

## 2018-01-31 LAB — HLA-B27 ANTIGEN: HLA B27: NEGATIVE

## 2018-01-31 LAB — HEPATITIS B CORE ANTIBODY, IGM: Hep B C IgM: NEGATIVE

## 2018-01-31 LAB — RHEUMATOID FACTOR: Rhuematoid fact SerPl-aCnc: 77.4 IU/mL — ABNORMAL HIGH (ref 0.0–13.9)

## 2018-01-31 LAB — ANGIOTENSIN CONVERTING ENZYME: Angio Convert Enzyme: 33 U/L (ref 14–82)

## 2018-01-31 LAB — HEPATITIS C ANTIBODY: Hep C Virus Ab: <0.1 s/co ratio (ref 0.0–0.9)

## 2018-02-06 ENCOUNTER — Encounter: Payer: Self-pay | Admitting: Rheumatology

## 2018-02-07 ENCOUNTER — Encounter: Payer: Self-pay | Admitting: Rheumatology

## 2018-02-07 ENCOUNTER — Telehealth: Payer: Self-pay | Admitting: Rheumatology

## 2018-02-07 MED ORDER — FOLIC ACID-VIT B6-VIT B12 2.2-25-1 MG PO TABS: 1.0000 | ORAL_TABLET | Freq: Every day | ORAL | 0 refills | Status: DC

## 2018-02-07 NOTE — Telephone Encounter (Signed)
Monica [name] from [facility] called to notify the office that patient's Humira pen will be delivered tomorrow 02/08/18.

## 2018-02-07 NOTE — Telephone Encounter (Signed)
Last Visit: 01/25/18 Next Visit: 05/17/18  Okay to refill per Dr. Estanislado Pandy

## 2018-02-07 NOTE — Telephone Encounter (Signed)
Will contact patient and schedule nurse visit once medication is received.

## 2018-02-08 NOTE — Telephone Encounter (Signed)
Patient has been scheduled for 02/09/18.

## 2018-02-08 NOTE — Telephone Encounter (Signed)
Received Humira from pharmacy. Medication has been place in the fridge. Please schedule nursing visit.   Monica [name], [place], CPhT 1:51 PM

## 2018-02-08 NOTE — Telephone Encounter (Signed)
Attempted to contact the patient and left message for patient to call the office.  

## 2018-02-09 ENCOUNTER — Encounter: Payer: Self-pay | Admitting: *Deleted

## 2018-02-09 ENCOUNTER — Ambulatory Visit (INDEPENDENT_AMBULATORY_CARE_PROVIDER_SITE_OTHER): Payer: 59 | Admitting: *Deleted

## 2018-02-09 VITALS — BP 133/80 | HR 80

## 2018-02-09 DIAGNOSIS — M1990 Unspecified osteoarthritis, unspecified site: Secondary | ICD-10-CM

## 2018-02-09 MED ORDER — ADALIMUMAB 40 MG/0.4ML ~~LOC~~ PSKT
40.0000 mg | PREFILLED_SYRINGE | Freq: Once | SUBCUTANEOUS | Status: AC
  Administered 2018-02-09: 40 mg via SUBCUTANEOUS

## 2018-02-09 NOTE — Patient Instructions (Signed)
Standing Labs We placed an order today for your standing lab work.    Please come back and get your standing labs in 1 month and every 3 months  We have open lab Monday through Friday from 8:30-11:30 AM and 1:30-4:00 PM  at the office of Dr. Bo Merino.   You may experience shorter wait times on Monday and Friday afternoons. The office is located at [address], [place], [place], [place] [address] No appointment is necessary.   Labs are drawn by [facility].  You may receive a bill from [facility] for your lab work. If you have any questions regarding directions or hours of operation,  please call [phone].    If you have an injection site reaction you may use Hydrocortisone on the area.  Then with next injection and every one thereafter use Zyrtec the day before injection the day of injection and the day after injection.   Next Injection due Feb 23, 2018 Then Mar 09, 2018 Then March 23, 2018 Then April 06, 2018

## 2018-02-09 NOTE — Progress Notes (Signed)
Administrations This Visit    Adalimumab PSKT 40 mg    Admin Date 02/09/2018 Action Given Dose 40 mg Route Subcutaneous Administered By Carole Binning, LPN        Patient in office for a new start to Humira. Patient was given a demonstration to self administer medication. Patient was able demonstrate to the proper technique. Patient was given injection in her left thigh and tolerated injection well. Patient was monitored for 30 minutes after injection for adverse reactions. No adverse reactions noted.

## 2018-02-19 ENCOUNTER — Other Ambulatory Visit: Payer: Self-pay | Admitting: Rheumatology

## 2018-02-20 MED ORDER — FOLIC ACID-VIT B6-VIT B12 2.2-25-1 MG PO TABS: 1.0000 | ORAL_TABLET | Freq: Every day | ORAL | 0 refills | Status: DC

## 2018-02-20 NOTE — Telephone Encounter (Signed)
Last Visit: 01/25/18 Next Visit: 05/17/18  Okay to refill per Dr. Estanislado Pandy

## 2018-03-09 ENCOUNTER — Encounter: Payer: Self-pay | Admitting: Rheumatology

## 2018-04-03 ENCOUNTER — Other Ambulatory Visit: Payer: Self-pay

## 2018-04-03 ENCOUNTER — Other Ambulatory Visit: Payer: Self-pay | Admitting: *Deleted

## 2018-04-03 DIAGNOSIS — Z79899 Other long term (current) drug therapy: Secondary | ICD-10-CM

## 2018-04-04 LAB — COMPLETE METABOLIC PANEL WITH GFR
AG Ratio: 1.7 (calc) (ref 1.0–2.5)
ALBUMIN MSPROF: 4.2 g/dL (ref 3.6–5.1)
ALKALINE PHOSPHATASE (APISO): 76 U/L (ref 33–130)
ALT: 17 U/L (ref 6–29)
AST: 22 U/L (ref 10–35)
BUN: 11 mg/dL (ref 7–25)
CO2: 31 mmol/L (ref 20–32)
CREATININE: 0.87 mg/dL (ref 0.50–0.99)
Calcium: 9.6 mg/dL (ref 8.6–10.4)
Chloride: 105 mmol/L (ref 98–110)
GFR, Est African American: 83 mL/min/{1.73_m2} (ref >=60)
GFR, Est Non African American: 72 mL/min/{1.73_m2} (ref >=60)
GLUCOSE: 81 mg/dL (ref 65–99)
Globulin: 2.5 g/dL (calc) (ref 1.9–3.7)
Potassium: 4.4 mmol/L (ref 3.5–5.3)
Sodium: 141 mmol/L (ref 135–146)
Total Bilirubin: 0.3 mg/dL (ref 0.2–1.2)
Total Protein: 6.7 g/dL (ref 6.1–8.1)

## 2018-04-04 LAB — CBC WITH DIFFERENTIAL/PLATELET
BASOS ABS: 28 {cells}/uL (ref 0–200)
Basophils Relative: 0.6 %
EOS ABS: 71 {cells}/uL (ref 15–500)
Eosinophils Relative: 1.5 %
HCT: 40.2 % (ref 35.0–45.0)
Hemoglobin: 13.3 g/dL (ref 11.7–15.5)
Lymphs Abs: 1781 cells/uL (ref 850–3900)
MCH: 29.3 pg (ref 27.0–33.0)
MCHC: 33.1 g/dL (ref 32.0–36.0)
MCV: 88.5 fL (ref 80.0–100.0)
MONOS PCT: 10.9 %
MPV: 8.8 fL (ref 7.5–12.5)
Neutro Abs: 2308 cells/uL (ref 1500–7800)
Neutrophils Relative %: 49.1 %
PLATELETS: 275 10*3/uL (ref 140–400)
RBC: 4.54 10*6/uL (ref 3.80–5.10)
RDW: 12.9 % (ref 11.0–15.0)
TOTAL LYMPHOCYTE: 37.9 %
WBC mixed population: 512 cells/uL (ref 200–950)
WBC: 4.7 10*3/uL (ref 3.8–10.8)

## 2018-04-04 NOTE — Progress Notes (Signed)
CBC WNL

## 2018-04-07 ENCOUNTER — Other Ambulatory Visit: Payer: Self-pay | Admitting: Rheumatology

## 2018-04-07 NOTE — Telephone Encounter (Signed)
Last Visit: 01/25/18 Next Visit: 05/17/18 Labs: 04/03/18 WNL  Okay to refill per Dr. Estanislado Pandy

## 2018-04-11 ENCOUNTER — Telehealth: Payer: Self-pay | Admitting: Rheumatology

## 2018-04-11 MED ORDER — ADALIMUMAB 40 MG/0.4ML ~~LOC~~ AJKT: 40.0000 mg | AUTO-INJECTOR | SUBCUTANEOUS | 0 refills | Status: DC

## 2018-04-11 NOTE — Telephone Encounter (Signed)
Patient called stating that she was told to contact the office when she needed a refill of her Humira.  Patient states the medication was sent to the office and she is requesting that it be sent to her home address.

## 2018-04-11 NOTE — Telephone Encounter (Signed)
Last Visit: 01/25/18 Next Visit: 05/17/18 Labs: 04/03/18 WNL TB Gold: 01/25/18 Neg   Okay to refill per Dr. Estanislado Pandy

## 2018-04-19 ENCOUNTER — Ambulatory Visit: Payer: 59

## 2018-05-03 NOTE — Progress Notes (Deleted)
Office Visit Note  Patient: Monica [name]             Date of Birth: 1956-04-13           MRN: [id number]             PCP: Lemmie Evens, MD Referring: Lemmie Evens, MD Visit Date: 05/17/2018 Occupation: @GUAROCC @  Subjective:  No chief complaint on file.   History of Present Illness: Monica [name] is a 62 y.o. female ***   Activities of Daily Living:  Patient reports morning stiffness for *** {minute/hour:19697}.   Patient {ACTIONS;DENIES/REPORTS:21021675::"Denies"} nocturnal pain.  Difficulty dressing/grooming: {ACTIONS;DENIES/REPORTS:21021675::"Denies"} Difficulty climbing stairs: {ACTIONS;DENIES/REPORTS:21021675::"Denies"} Difficulty getting out of chair: {ACTIONS;DENIES/REPORTS:21021675::"Denies"} Difficulty using hands for taps, buttons, cutlery, and/or writing: {ACTIONS;DENIES/REPORTS:21021675::"Denies"}  No Rheumatology ROS completed.   PMFS History:  Patient Active Problem List   Diagnosis Date Noted  . High risk medication use 01/13/2017  . History of iritis 01/13/2017  . Essential hypertension 01/13/2017  . Macular degeneration 01/13/2017  . 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 Leg weakness 07/08/2014  . Difficulty walking 07/08/2014  . Vaginal dryness   . 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 Raynaud disease   . Fibromyalgia   . Inflammatory arthritis   . Osteopenia     Past Medical History:  Diagnosis Date  . Arthritis   . Fibromyalgia   . Macular degeneration    Left eye  . Osteopenia 12/2014   T score -2.1 FRAX 8.6%/1.1%  . Raynaud disease   . Reiters syndrome   . Sleep disturbance   . Vaginal dryness     Family History  Problem Relation Age of Onset  . Hypertension Sister   . Heart disease Brother   . Hyperlipidemia Brother   . Alzheimer's disease Mother   . Osteoarthritis Mother   . High Cholesterol Sister   . Healthy Son   . Healthy Son    Past Surgical History:  Procedure Laterality Date   . BREAST SURGERY     LEFT BR LUMP-Benign   Social History   Social History Narrative  . Not on file    Objective: Vital Signs: There were no vitals taken for this visit.   Physical Exam   Musculoskeletal Exam: ***  CDAI Exam: No CDAI exam completed.   Investigation: No additional findings.  Imaging: No results found.  Recent Labs: Lab Results  Component Value Date   WBC 4.7 04/03/2018   HGB 13.3 04/03/2018   PLT 275 04/03/2018   NA 141 04/03/2018   K 4.4 04/03/2018   CL 105 04/03/2018   CO2 31 04/03/2018   GLUCOSE 81 04/03/2018   BUN 11 04/03/2018   CREATININE 0.87 04/03/2018   BILITOT 0.3 04/03/2018   ALKPHOS 92 01/03/2018   AST 22 04/03/2018   ALT 17 04/03/2018   PROT 6.7 04/03/2018   ALBUMIN 4.0 01/03/2018   CALCIUM 9.6 04/03/2018   GFRAA 83 04/03/2018   QFTBGOLDPLUS Negative 01/25/2018    Speciality Comments: No specialty comments available.  Procedures:  No procedures performed Allergies: Patient has no known allergies.   Assessment / Plan:     Visit Diagnoses: Inflammatory arthritis  High risk medication use - Sulfasalazine 500 mg 2 tabs po bid, Celebrex 200mg  po qd   History of iritis  Raynaud's disease without gangrene  Fibromyalgia  Osteopenia of multiple sites  Chronic SI joint pain  Spondylosis of lumbar region without myelopathy or radiculopathy  Essential hypertension  History of  macular degeneration   Orders: No orders of the defined types were placed in this encounter.  No orders of the defined types were placed in this encounter.   Face-to-face time spent with patient was *** minutes. Greater than 50% of time was spent in counseling and coordination of care.  Follow-Up Instructions: No follow-ups on file.   Ofilia Neas, PA-C  Note - This record has been created using Dragon software.  Chart creation errors have been sought, but may not always  have been located. Such creation errors do not reflect on  the  standard of medical care.

## 2018-05-10 ENCOUNTER — Ambulatory Visit: Payer: 59

## 2018-05-16 ENCOUNTER — Ambulatory Visit (INDEPENDENT_AMBULATORY_CARE_PROVIDER_SITE_OTHER): Payer: 59

## 2018-05-16 ENCOUNTER — Other Ambulatory Visit: Payer: Self-pay | Admitting: Gynecology

## 2018-05-16 DIAGNOSIS — M8589 Other specified disorders of bone density and structure, multiple sites: Secondary | ICD-10-CM | POA: Diagnosis not present

## 2018-05-16 DIAGNOSIS — Z13820 Encounter for screening for osteoporosis: Secondary | ICD-10-CM

## 2018-05-16 DIAGNOSIS — M8580 Other specified disorders of bone density and structure, unspecified site: Secondary | ICD-10-CM

## 2018-05-17 ENCOUNTER — Encounter: Payer: Self-pay | Admitting: Gynecology

## 2018-05-17 ENCOUNTER — Ambulatory Visit: Payer: 59 | Admitting: Physician Assistant

## 2018-05-22 ENCOUNTER — Ambulatory Visit (INDEPENDENT_AMBULATORY_CARE_PROVIDER_SITE_OTHER): Payer: Self-pay

## 2018-05-22 DIAGNOSIS — Z1211 Encounter for screening for malignant neoplasm of colon: Secondary | ICD-10-CM

## 2018-05-22 MED ORDER — NA SULFATE-K SULFATE-MG SULF 17.5-3.13-1.6 GM/177ML PO SOLN: 1.0000 | ORAL | 0 refills | Status: DC

## 2018-05-22 NOTE — Progress Notes (Signed)
Ok to schedule.

## 2018-05-22 NOTE — Patient Instructions (Signed)
Monica [name]  31-Jul-1956 MRN: [id number]     Procedure Date: 08/04/18 Time to register: 7:30am Place to register: [facility] Procedure Time: 8:30am Scheduled provider: Barney Drain, MD    PREPARATION FOR COLONOSCOPY WITH SUPREP BOWEL PREP KIT  Note: Suprep Bowel Prep Kit is a split-dose (2day) regimen. Consumption of BOTH 6-ounce bottles is required for a complete prep.  Please notify us immediately if you are diabetic, take iron supplements, or if you are on Coumadin or any other blood thinners.                                                                                                                                                    1 DAY BEFORE PROCEDURE:  DATE: 08/03/18  DAY: Thursday  clear liquids the entire day - NO SOLID FOOD.     At 6:00pm: Complete steps 1 through 4 below, using ONE (1) 6-ounce bottle, before going to bed. Step 1:  Pour ONE (1) 6-ounce bottle of SUPREP liquid into the mixing container.  Step 2:  Add cool drinking water to the 16 ounce line on the container and mix.  Note: Dilute the solution concentrate as directed prior to use. Step 3:  DRINK ALL the liquid in the container. Step 4:  You MUST drink an additional two (2) or more 16 ounce containers of water over the next one (1) hour.   Continue clear liquids.  DAY OF PROCEDURE:   DATE: 08/04/18   DAY: Friday If you take medications for your heart, blood pressure, or breathing, you may take these medications.    5 hours before your procedure at :3:30am Step 1:  Pour ONE (1) 6-ounce bottle of SUPREP liquid into the mixing container.  Step 2:  Add cool drinking water to the 16 ounce line on the container and mix.  Note: Dilute the solution concentrate as directed prior to use. Step 3:  DRINK ALL the liquid in the container. Step 4:  You MUST drink an additional two (2) or more 16 ounce containers of water over the next one (1) hour. You MUST complete the final glass of water  at least 3 hours before your colonoscopy.   Nothing by mouth past 5:30am  You may take your morning medications with sip of water unless we have instructed otherwise.    Please see below for Dietary Information.  CLEAR LIQUIDS INCLUDE:  Water Jello (NOT red in color)   Ice Popsicles (NOT red in color)   Tea (sugar ok, no milk/cream) Powdered fruit flavored drinks  Coffee (sugar ok, no milk/cream) Gatorade/ Lemonade/ Kool-Aid  (NOT red in color)   Juice: apple, white grape, white cranberry Soft drinks  Clear bullion, consomme, broth (fat free beef/chicken/vegetable)  Carbonated beverages (any kind)  Strained chicken noodle soup Hard Candy   Remember: Clear  liquids are liquids that will allow you to see your fingers on the other side of a clear glass. Be sure liquids are NOT red in color, and not cloudy, but CLEAR.  DO NOT EAT OR DRINK ANY OF THE FOLLOWING:  Dairy products of any kind   Cranberry juice Tomato juice / V8 juice   Grapefruit juice Orange juice     Red grape juice  Do not eat any solid foods, including such foods as: cereal, oatmeal, yogurt, fruits, vegetables, creamed soups, eggs, bread, crackers, pureed foods in a blender, etc.   HELPFUL HINTS FOR DRINKING PREP SOLUTION:   Make sure prep is extremely cold. Mix and refrigerate the the morning of the prep. You may also put in the freezer.   You may try mixing some Crystal Light or Country Time Lemonade if you prefer. Mix in small amounts; add more if necessary.  Try drinking through a straw  Rinse mouth with water or a mouthwash between glasses, to remove after-taste.  Try sipping on a cold beverage /ice/ popsicles between glasses of prep.  Place a piece of sugar-free hard candy in mouth between glasses.  If you become nauseated, try consuming smaller amounts, or stretch out the time between glasses. Stop for 30-60 minutes, then slowly start back drinking.     OTHER INSTRUCTIONS  You will need a responsible  adult at least 61 years of age to accompany you and drive you home. This person must remain in the waiting room during your procedure. The hospital will cancel your procedure if you do not have a responsible adult with you.   1. Wear loose fitting clothing that is easily removed. 2. Leave jewelry and other valuables at home.  3. Remove all body piercing jewelry and leave at home. 4. Total time from sign-in until discharge is approximately 2-3 hours. 5. You should go home directly after your procedure and rest. You can resume normal activities the day after your procedure. 6. The day of your procedure you should not:  Drive  Make legal decisions  Operate machinery  Drink alcohol  Return to work   You may call the office (Dept: [phone]) before 5:00pm, or page the doctor on call [phone]) after 5:00pm, for further instructions, if necessary.   Insurance Information YOU WILL NEED TO CHECK WITH YOUR INSURANCE COMPANY FOR THE BENEFITS OF COVERAGE YOU HAVE FOR THIS PROCEDURE.  UNFORTUNATELY, NOT ALL INSURANCE COMPANIES HAVE BENEFITS TO COVER ALL OR PART OF THESE TYPES OF PROCEDURES.  IT IS YOUR RESPONSIBILITY TO CHECK YOUR BENEFITS, HOWEVER, WE WILL BE GLAD TO ASSIST YOU WITH ANY CODES YOUR INSURANCE COMPANY MAY NEED.    PLEASE NOTE THAT MOST INSURANCE COMPANIES WILL NOT COVER A SCREENING COLONOSCOPY FOR PEOPLE UNDER THE AGE OF 50  IF YOU HAVE BCBS INSURANCE, YOU MAY HAVE BENEFITS FOR A SCREENING COLONOSCOPY BUT IF POLYPS ARE FOUND THE DIAGNOSIS WILL CHANGE AND THEN YOU MAY HAVE A DEDUCTIBLE THAT WILL NEED TO BE MET. SO PLEASE MAKE SURE YOU CHECK YOUR BENEFITS FOR A SCREENING COLONOSCOPY AS WELL AS A DIAGNOSTIC COLONOSCOPY.

## 2018-05-22 NOTE — Progress Notes (Addendum)
Gastroenterology Pre-Procedure Review  Request Date:05/22/18 Requesting Physician: Lenon Oms NP [facility] (last tcs 07/08/08- [facility]- no polyps)  PATIENT REVIEW QUESTIONS: The patient responded to the following health history questions as indicated:    1. Diabetes Melitis: no 2. Joint replacements in the past 12 months: no 3. Major health problems in the past 3 months: no 4. Has an artificial valve or MVP: no 5. Has a defibrillator: no 6. Has been advised in past to take antibiotics in advance of a procedure like teeth cleaning: no 7. Family history of colon cancer: no  8. Alcohol Use: no 9. History of sleep apnea: no  10. History of coronary artery or other vascular stents placed within the last 12 months: no 11. History of any prior anesthesia complications: no    MEDICATIONS & ALLERGIES:    Patient reports the following regarding taking any blood thinners:   Plavix? no Aspirin? no Coumadin? no Brilinta? no Xarelto? no Eliquis? no Pradaxa? no Savaysa? no Effient? no  Patient confirms/reports the following medications:  Current Outpatient Medications  Medication Sig Dispense Refill  . acetaminophen (TYLENOL) 500 MG tablet Take 500 mg by mouth as needed for moderate pain.    . Adalimumab (HUMIRA PEN) 40 MG/0.4ML PNKT Inject 40 mg into the skin every 14 (fourteen) days. 3 each 0  . cholecalciferol (VITAMIN D) 1000 UNITS tablet Take 3,000 Units by mouth daily.     . diclofenac sodium (VOLTAREN) 1 % GEL Apply 3 grams to 3 large joints up to 3 times daily 3 Tube 3  . Estradiol (VAGIFEM) 10 MCG TABS vaginal tablet Place 1 tablet (10 mcg total) vaginally 2 (two) times a week. 8 tablet 12  . Folic Acid-Vit D6-LOV F64 (FABB) 2.2-25-1 MG TABS Take 1 tablet by mouth daily. 90 each 0  . Lifitegrast (XIIDRA) 5 % SOLN Apply 1 drop to eye as needed (Dry eye).    Donnie Coffin POTASSIUM PO Take 25 mg by mouth daily.     Marland Kitchen sulfaSALAzine (AZULFIDINE) 500 MG EC tablet TAKE (2)  TABLETS BY MOUTH TWICE DAILY. 360 tablet 0   Current Facility-Administered Medications  Medication Dose Route Frequency Provider Last Rate Last Dose  . lidocaine (PF) (XYLOCAINE) 1 % injection 0.3 mL  0.3 mL Other Once Magnus Sinning, MD      . methylPREDNISolone acetate (DEPO-MEDROL) injection 80 mg  80 mg Other Once Magnus Sinning, MD        Patient confirms/reports the following allergies:  No Known Allergies  No orders of the defined types were placed in this encounter.   AUTHORIZATION INFORMATION Primary Insurance: [facility],  [place] #: [id number] Pre-Cert / Josem Kaufmann required: yes Pre-Cert / Auth #: [id number]   SCHEDULE INFORMATION: Procedure has been scheduled as follows:  Date: 08/04/18, Time: 8:30am Location: [facility] This Gastroenterology Pre-Precedure Review Form is being routed to the following provider(s): Neil Crouch, PA

## 2018-06-19 ENCOUNTER — Other Ambulatory Visit: Payer: Self-pay | Admitting: Rheumatology

## 2018-06-19 NOTE — Progress Notes (Deleted)
Office Visit Note  Patient: Monica [name]             Date of Birth: 25-Sep-1956           MRN: [id number]             PCP: Lemmie Evens, MD Referring: Lemmie Evens, MD Visit Date: 07/03/2018 Occupation: @GUAROCC @  Subjective:  No chief complaint on file.   History of Present Illness: Monica [name] is a 62 y.o. female ***   Activities of Daily Living:  Patient reports morning stiffness for *** {minute/hour:19697}.   Patient {ACTIONS;DENIES/REPORTS:21021675::"Denies"} nocturnal pain.  Difficulty dressing/grooming: {ACTIONS;DENIES/REPORTS:21021675::"Denies"} Difficulty climbing stairs: {ACTIONS;DENIES/REPORTS:21021675::"Denies"} Difficulty getting out of chair: {ACTIONS;DENIES/REPORTS:21021675::"Denies"} Difficulty using hands for taps, buttons, cutlery, and/or writing: {ACTIONS;DENIES/REPORTS:21021675::"Denies"}  No Rheumatology ROS completed.   PMFS History:  Patient Active Problem List   Diagnosis Date Noted  . High risk medication use 01/13/2017  . History of iritis 01/13/2017  . Essential hypertension 01/13/2017  . Macular degeneration 01/13/2017  . 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 Leg weakness 07/08/2014  . Difficulty walking 07/08/2014  . Vaginal dryness   . 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 Raynaud disease   . Fibromyalgia   . Inflammatory arthritis   . Osteopenia     Past Medical History:  Diagnosis Date  . Arthritis   . Fibromyalgia   . Macular degeneration    Left eye  . Osteopenia 05/2018   T score -2.3 wax 11% / 1.8% overall stable from prior DEXA  . Raynaud disease   . Reiters syndrome   . Sleep disturbance   . Vaginal dryness     Family History  Problem Relation Age of Onset  . Hypertension Sister   . Heart disease Brother   . Hyperlipidemia Brother   . Alzheimer's disease Mother   . Osteoarthritis Mother   . High Cholesterol Sister   . Healthy Son   . Healthy Son    Past Surgical  History:  Procedure Laterality Date  . BREAST SURGERY     LEFT BR LUMP-Benign   Social History   Social History Narrative  . Not on file    Objective: Vital Signs: There were no vitals taken for this visit.   Physical Exam   Musculoskeletal Exam: ***  CDAI Exam: CDAI Score: Not documented Patient Global Assessment: Not documented; Provider Global Assessment: Not documented Swollen: Not documented; Tender: Not documented Joint Exam   Not documented   There is currently no information documented on the homunculus. Go to the Rheumatology activity and complete the homunculus joint exam.  Investigation: No additional findings.  Imaging: No results found.  Recent Labs: Lab Results  Component Value Date   WBC 4.7 04/03/2018   HGB 13.3 04/03/2018   PLT 275 04/03/2018   NA 141 04/03/2018   K 4.4 04/03/2018   CL 105 04/03/2018   CO2 31 04/03/2018   GLUCOSE 81 04/03/2018   BUN 11 04/03/2018   CREATININE 0.87 04/03/2018   BILITOT 0.3 04/03/2018   ALKPHOS 92 01/03/2018   AST 22 04/03/2018   ALT 17 04/03/2018   PROT 6.7 04/03/2018   ALBUMIN 4.0 01/03/2018   CALCIUM 9.6 04/03/2018   GFRAA 83 04/03/2018   QFTBGOLDPLUS Negative 01/25/2018    Speciality Comments: No specialty comments available.  Procedures:  No procedures performed Allergies: Patient has no known allergies.   Assessment / Plan:     Visit Diagnoses: Inflammatory arthritis  High risk medication  use -  Sulfasalazine 500 mg 2 tabs po bid, Celebrex 200mg  po qd   History of iritis  Raynaud's disease without gangrene  Fibromyalgia  Osteopenia of multiple sites  Spondylosis of lumbar region without myelopathy or radiculopathy  Essential hypertension  History of macular degeneration  Chronic SI joint pain   Orders: No orders of the defined types were placed in this encounter.  No orders of the defined types were placed in this encounter.   Face-to-face time spent with patient was ***  minutes. Greater than 50% of time was spent in counseling and coordination of care.  Follow-Up Instructions: No follow-ups on file.   Ofilia Neas, PA-C  Note - This record has been created using Dragon software.  Chart creation errors have been sought, but may not always  have been located. Such creation errors do not reflect on  the standard of medical care.

## 2018-06-19 NOTE — Telephone Encounter (Signed)
Last Visit: 01/25/18 Next Visit: 05/17/18 Labs: 04/03/18 WNL TB Gold: 01/25/18 Neg   Okay to refill per Dr. Estanislado Pandy

## 2018-07-03 ENCOUNTER — Ambulatory Visit: Payer: 59 | Admitting: Physician Assistant

## 2018-07-03 NOTE — Progress Notes (Signed)
Office Visit Note  Patient: Monica [name]             Date of Birth: Jul 08, 1956           MRN: [id number]             PCP: Lemmie Evens, MD Referring: Lemmie Evens, MD Visit Date: 07/07/2018 Occupation: @GUAROCC @  Subjective:  Right SI joint pain   History of Present Illness: Monica [name] is a 62 y.o. female with history of inflammatory arthritis.  She is on Humira 40 mg sq once every 14 days, SSZ 500 mg 2 tablets by mouth BID and Celebrex 200 mg po daily.  She states that 3 to 4 weeks ago she developed right SI joint pain.  She states that the pain has been keeping up at night.  She states the pain is slowly been improving but is still been very uncomfortable.  She states the left-sided sciatica has resolved.  She continues to have pain in bilateral knee joints.  She states that she feels as though stiffness has been lasting longer in the mornings.  She denies any joint swelling at this time.  She has been taking Tylenol recently for bilateral SI joint pain.  She is also been using Voltaren gel as needed.    Activities of Daily Living:  Patient reports morning stiffness for several hours.   Patient Reports nocturnal pain.   Difficulty dressing/grooming: Denies Difficulty climbing stairs: Denies Difficulty getting out of chair: Reports Difficulty using hands for taps, buttons, cutlery, and/or writing: Reports  Review of Systems  Constitutional: Positive for fatigue.  HENT: Negative for mouth sores, trouble swallowing, trouble swallowing, mouth dryness and nose dryness.   Eyes: Positive for dryness. Negative for pain and visual disturbance.  Respiratory: Negative for cough, hemoptysis, shortness of breath and difficulty breathing.   Cardiovascular: Negative for chest pain, palpitations, hypertension and swelling in legs/feet.  Gastrointestinal: Negative for abdominal pain, blood in stool, constipation, diarrhea, nausea and vomiting.  Endocrine: Negative for increased  urination.  Genitourinary: Negative for painful urination, nocturia and pelvic pain.  Musculoskeletal: Positive for arthralgias, joint pain and morning stiffness. Negative for joint swelling, myalgias, muscle weakness, muscle tenderness and myalgias.  Skin: Negative for color change, pallor, rash, hair loss, nodules/bumps, skin tightness, ulcers and sensitivity to sunlight.  Allergic/Immunologic: Negative for susceptible to infections.  Neurological: Negative for dizziness, light-headedness, numbness, headaches, memory loss and weakness.  Hematological: Negative for bruising/bleeding tendency and swollen glands.  Psychiatric/Behavioral: Negative for depressed mood, confusion and sleep disturbance. The patient is not nervous/anxious.     PMFS History:  Patient Active Problem List   Diagnosis Date Noted  . High risk medication use 01/13/2017  . History of iritis 01/13/2017  . Essential hypertension 01/13/2017  . Macular degeneration 01/13/2017  . 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 Leg weakness 07/08/2014  . Difficulty walking 07/08/2014  . Vaginal dryness   . 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 Raynaud disease   . Fibromyalgia   . Inflammatory arthritis   . Osteopenia     Past Medical History:  Diagnosis Date  . Arthritis   . Fibromyalgia   . Macular degeneration    Left eye  . Osteopenia 05/2018   T score -2.3 wax 11% / 1.8% overall stable from prior DEXA  . Raynaud disease   . Reiters syndrome   . Sleep disturbance   . Vaginal dryness     Family History  Problem Relation Age of Onset  . Hypertension Sister   . Heart disease Brother   . Hyperlipidemia Brother   . Alzheimer's disease Mother   . Osteoarthritis Mother   . High Cholesterol Sister   . Heart attack Brother   . Heart disease Brother   . Healthy Son   . Healthy Son    Past Surgical History:  Procedure Laterality Date  . BREAST SURGERY     LEFT BR LUMP-Benign    Social History   Social History Narrative  . Not on file    Objective: Vital Signs: BP (!) 130/91 (BP Location: Left Arm, Patient Position: Sitting, Cuff Size: Normal)   Pulse 80   Resp 12   Ht 5\' 4"  (1.626 m)   Wt 142 lb (64.4 kg)   BMI 24.37 kg/m    Physical Exam  Constitutional: She appears well-developed and well-nourished.  HENT:  Head: Normocephalic and atraumatic.  Eyes: Conjunctivae and EOM are normal.  Neck: Normal range of motion.  Cardiovascular: Normal rate, regular rhythm, normal heart sounds and intact distal pulses.  Pulmonary/Chest: Effort normal and breath sounds normal.  Abdominal: Soft. Bowel sounds are normal.  Lymphadenopathy:    She has no cervical adenopathy.  Neurological: She is alert.  Skin: Skin is warm and dry. Capillary refill takes less than 2 seconds.  Psychiatric: She has a normal mood and affect. Her behavior is normal.  Nursing note and vitals reviewed.    Musculoskeletal Exam: C-spine, thoracic spine, lumbar spine good range of motion.  No midline spinal tenderness.  Bilateral SI joint tenderness worse on the right side.  Shoulder joints, elbow joints, wrist joints, MCPs, PIPs, DIPs good range of motion with no synovitis.  Complete fist formation bilaterally.  Hip joints, knee joints, ankle joints, MTPs, PIPs, DIPs good range of motion no synovitis.  No warmth or effusion bilateral knee joints.  She has bilateral knee crepitus.  No tenderness or swelling of ankle joints.  No Achilles tendinitis or plantar fasciitis.  CDAI Exam: CDAI Score: 1  Patient Global Assessment: 5 (mm); Provider Global Assessment: 5 (mm) Swollen: 0 ; Tender: 0  Joint Exam   Not documented   There is currently no information documented on the homunculus. Go to the Rheumatology activity and complete the homunculus joint exam.  Investigation: No additional findings.  Imaging: No results found.  Recent Labs: Lab Results  Component Value Date   WBC 4.7  04/03/2018   HGB 13.3 04/03/2018   PLT 275 04/03/2018   NA 141 04/03/2018   K 4.4 04/03/2018   CL 105 04/03/2018   CO2 31 04/03/2018   GLUCOSE 81 04/03/2018   BUN 11 04/03/2018   CREATININE 0.87 04/03/2018   BILITOT 0.3 04/03/2018   ALKPHOS 92 01/03/2018   AST 22 04/03/2018   ALT 17 04/03/2018   PROT 6.7 04/03/2018   ALBUMIN 4.0 01/03/2018   CALCIUM 9.6 04/03/2018   GFRAA 83 04/03/2018   QFTBGOLDPLUS Negative 01/25/2018    Speciality Comments: TB gold neg 01/25/18 HIV, Hep, IG, SPEP neg 01/25/18  Procedures:  Sacroiliac Joint Inj on 07/07/2018 11:55 AM Indications: pain Details: 27 G 1.5 in needle, posterior approach Medications: 1 mL lidocaine 1 %; 40 mg triamcinolone acetonide 40 MG/ML Aspirate: 0 mL Outcome: tolerated well, no immediate complications Procedure, treatment alternatives, risks and benefits explained, specific risks discussed. Consent was given by the patient. Immediately prior to procedure a time out was called to verify the correct patient, procedure, equipment, support  staff and site/side marked as required. Patient was prepped and draped in the usual sterile fashion.     Allergies: Patient has no known allergies.   Assessment / Plan:     Visit Diagnoses: Inflammatory arthritis: She has no synovitis on exam.  She presents today with bilateral SI joint pain worse on the right side.  She requested a cortisone injection today in the office.  She tolerated procedure well.  Potential side effects were discussed.  She has no Achilles tendinitis or plantar fasciitis at this time.  She continues to have discomfort in bilateral hands but no synovitis was noted.  She seems to clinically be doing well on sulfasalazine 500 mg 2 tablets by mouth twice daily and Humira 40 mg subcutaneously every 14 days.  A refill of sulfasalazine was sent to the pharmacy today.  A refill of Voltaren gel was sent to the pharmacy.  We [name] check labs today in the office.  She was advised to  notify us if she develops increased joint pain or joint swelling.  She [name] follow-up in the office in 5 months.  She was encouraged to have her yearly influenza vaccine.  High risk medication use - Sulfasalazine 500 mg 2 tabs po bid, Humira 40 mg sq every 14 days-CBC and CMP were within normal limits on 04/03/2018.  We [name] check CBC and CMP to monitor for drug toxicity.  She [name] return in December and every 3 months for lab work.  TB gold was negative on 01/25/2018.- Plan: CBC with Differential/Platelet, COMPLETE METABOLIC PANEL WITH GFR  History of iritis: She has not had any recent iritis flares.   Neck pain: She has intermittent discomfort in her neck.  She has no symptoms of radiculopathy at this time.  Chronic right SI joint pain: She has right SI joint tenderness on exam today.  She requested a right SI joint cortisone injection today in the office.  She tolerated the procedure well.  Potential side effects were discussed.  She was advised to monitor her blood pressure closely upon the cortisone injection.  Raynaud's disease without gangrene: She continues to have intermittent symptoms of Raynaud's.  No digital ulcerations or signs of gangrene were noted on exam.  Fibromyalgia: She continues to have some generalized muscle aches muscle tenderness due to fibromyalgia.  She continues to have chronic fatigue.  She was encouraged to try to stay active and exercise on a regular basis.  Spondylosis of lumbar region without myelopathy or radiculopathy: She has no midline spinal tenderness.  She is good range of motion on exam.  Osteopenia of multiple sites: She is on a vitamin D supplement daily.  Essential hypertension: She is advised to monitor her blood pressure closely upon the cortisone injection today.  History of macular degeneration   Orders: Orders Placed This Encounter  Procedures  . Sacroiliac Joint Inj  . CBC with Differential/Platelet  . COMPLETE METABOLIC PANEL WITH GFR    Meds ordered this encounter  Medications  . sulfaSALAzine (AZULFIDINE) 500 MG EC tablet    Sig: Take 2 tablets by mouth twice daily.    Dispense:  120 tablet    Refill:  2  . diclofenac sodium (VOLTAREN) 1 % GEL    Sig: Apply 3 grams to 3 large joints up to 3 times daily    Dispense:  3 Tube    Refill:  3  . Folic Acid-Vit T2-WPY K99 (FABB) 2.2-25-1 MG TABS    Sig: Take 1 tablet by mouth  daily.    Dispense:  90 each    Refill:  0    Face-to-face time spent with patient was 30 minutes. Greater than 50% of time was spent in counseling and coordination of care.  Follow-Up Instructions: Return in about 5 months (around 12/07/2018) for Inflammatory arthritis .   Ofilia Neas, PA-C  Note - This record has been created using Dragon software.  Chart creation errors have been sought, but may not always  have been located. Such creation errors do not reflect on  the standard of medical care.

## 2018-07-05 ENCOUNTER — Other Ambulatory Visit: Payer: Self-pay | Admitting: Rheumatology

## 2018-07-05 NOTE — Telephone Encounter (Signed)
Last Visit: 01/25/18 Next Visit: 05/17/18 Labs: 04/03/18 WNL  Okay to refill per Dr. Estanislado Pandy

## 2018-07-07 ENCOUNTER — Ambulatory Visit: Payer: 59 | Admitting: Physician Assistant

## 2018-07-07 ENCOUNTER — Encounter: Payer: Self-pay | Admitting: Physician Assistant

## 2018-07-07 VITALS — BP 130/91 | HR 80 | Resp 12 | Ht 64.0 in | Wt 142.0 lb

## 2018-07-07 DIAGNOSIS — M1990 Unspecified osteoarthritis, unspecified site: Secondary | ICD-10-CM | POA: Diagnosis not present

## 2018-07-07 DIAGNOSIS — M533 Sacrococcygeal disorders, not elsewhere classified: Secondary | ICD-10-CM

## 2018-07-07 DIAGNOSIS — Z79899 Other long term (current) drug therapy: Secondary | ICD-10-CM | POA: Diagnosis not present

## 2018-07-07 DIAGNOSIS — Z8669 Personal history of other diseases of the nervous system and sense organs: Secondary | ICD-10-CM

## 2018-07-07 DIAGNOSIS — I7300 Raynaud's syndrome without gangrene: Secondary | ICD-10-CM

## 2018-07-07 DIAGNOSIS — M797 Fibromyalgia: Secondary | ICD-10-CM

## 2018-07-07 DIAGNOSIS — G8929 Other chronic pain: Secondary | ICD-10-CM | POA: Diagnosis not present

## 2018-07-07 DIAGNOSIS — I10 Essential (primary) hypertension: Secondary | ICD-10-CM

## 2018-07-07 DIAGNOSIS — M8589 Other specified disorders of bone density and structure, multiple sites: Secondary | ICD-10-CM

## 2018-07-07 DIAGNOSIS — M47816 Spondylosis without myelopathy or radiculopathy, lumbar region: Secondary | ICD-10-CM

## 2018-07-07 DIAGNOSIS — M542 Cervicalgia: Secondary | ICD-10-CM

## 2018-07-07 LAB — CBC WITH DIFFERENTIAL/PLATELET
BASOS PCT: 0.4 %
Basophils Absolute: 21 cells/uL (ref 0–200)
Eosinophils Absolute: 244 cells/uL (ref 15–500)
Eosinophils Relative: 4.6 %
HEMATOCRIT: 42.7 % (ref 35.0–45.0)
HEMOGLOBIN: 14.1 g/dL (ref 11.7–15.5)
Lymphs Abs: 1850 cells/uL (ref 850–3900)
MCH: 29.1 pg (ref 27.0–33.0)
MCHC: 33 g/dL (ref 32.0–36.0)
MCV: 88 fL (ref 80.0–100.0)
MPV: 9 fL (ref 7.5–12.5)
Monocytes Relative: 9.7 %
NEUTROS ABS: 2671 {cells}/uL (ref 1500–7800)
Neutrophils Relative %: 50.4 %
PLATELETS: 321 10*3/uL (ref 140–400)
RBC: 4.85 10*6/uL (ref 3.80–5.10)
RDW: 13 % (ref 11.0–15.0)
Total Lymphocyte: 34.9 %
WBC: 5.3 10*3/uL (ref 3.8–10.8)
WBCMIX: 514 {cells}/uL (ref 200–950)

## 2018-07-07 LAB — COMPLETE METABOLIC PANEL WITH GFR
AG RATIO: 1.7 (calc) (ref 1.0–2.5)
ALBUMIN MSPROF: 4.5 g/dL (ref 3.6–5.1)
ALT: 21 U/L (ref 6–29)
AST: 23 U/L (ref 10–35)
Alkaline phosphatase (APISO): 86 U/L (ref 33–130)
BUN: 12 mg/dL (ref 7–25)
CALCIUM: 10.1 mg/dL (ref 8.6–10.4)
CO2: 28 mmol/L (ref 20–32)
Chloride: 102 mmol/L (ref 98–110)
Creat: 0.82 mg/dL (ref 0.50–0.99)
GFR, EST AFRICAN AMERICAN: 90 mL/min/{1.73_m2} (ref >=60)
GFR, EST NON AFRICAN AMERICAN: 77 mL/min/{1.73_m2} (ref >=60)
GLOBULIN: 2.6 g/dL (ref 1.9–3.7)
Glucose, Bld: 91 mg/dL (ref 65–99)
POTASSIUM: 4.1 mmol/L (ref 3.5–5.3)
Sodium: 139 mmol/L (ref 135–146)
TOTAL PROTEIN: 7.1 g/dL (ref 6.1–8.1)
Total Bilirubin: 0.4 mg/dL (ref 0.2–1.2)

## 2018-07-07 MED ORDER — LIDOCAINE HCL 1 % IJ SOLN
1.0000 mL | INTRAMUSCULAR | Status: AC | PRN
  Administered 2018-07-07: 1 mL

## 2018-07-07 MED ORDER — DICLOFENAC SODIUM 1 % TD GEL: TRANSDERMAL | 3 refills | Status: DC

## 2018-07-07 MED ORDER — SULFASALAZINE 500 MG PO TBEC: DELAYED_RELEASE_TABLET | ORAL | 2 refills | Status: DC

## 2018-07-07 MED ORDER — FOLIC ACID-VIT B6-VIT B12 2.2-25-1 MG PO TABS: 1.0000 | ORAL_TABLET | Freq: Every day | ORAL | 0 refills | Status: DC

## 2018-07-07 MED ORDER — TRIAMCINOLONE ACETONIDE 40 MG/ML IJ SUSP
40.0000 mg | INTRAMUSCULAR | Status: AC | PRN
  Administered 2018-07-07: 40 mg via INTRA_ARTICULAR

## 2018-07-07 NOTE — Patient Instructions (Signed)
Standing Labs We placed an order today for your standing lab work.    Please come back and get your standing labs in December and every 3 months   We have open lab Monday through Friday from 8:30-11:30 AM and 1:30-4:00 PM  at the office of Dr. Shaili Deveshwar.   You may experience shorter wait times on Monday and Friday afternoons. The office is located at [address], [address], [place], [place] [address] No appointment is necessary.   Labs are drawn by [facility].  You may receive a bill from [facility] for your lab work. If you have any questions regarding directions or hours of operation,  please call [phone].   Just as a reminder please drink plenty of water prior to coming for your lab work. Thanks!   

## 2018-08-04 ENCOUNTER — Encounter (HOSPITAL_COMMUNITY): Payer: Self-pay | Admitting: *Deleted

## 2018-08-04 ENCOUNTER — Ambulatory Visit (HOSPITAL_COMMUNITY)
Admission: RE | Admit: 2018-08-04 | Discharge: 2018-08-04 | Disposition: A | Discharge status: Home / Self Care | Payer: 59 | Source: Ambulatory Visit | Attending: Gastroenterology | Admitting: Gastroenterology

## 2018-08-04 ENCOUNTER — Encounter (HOSPITAL_COMMUNITY)
Admission: RE | Disposition: A | Discharge status: Home / Self Care | Payer: Self-pay | Source: Ambulatory Visit | Attending: Gastroenterology

## 2018-08-04 ENCOUNTER — Other Ambulatory Visit: Payer: Self-pay

## 2018-08-04 DIAGNOSIS — Z1211 Encounter for screening for malignant neoplasm of colon: Secondary | ICD-10-CM

## 2018-08-04 DIAGNOSIS — Q438 Other specified congenital malformations of intestine: Secondary | ICD-10-CM | POA: Insufficient documentation

## 2018-08-04 DIAGNOSIS — Z8249 Family history of ischemic heart disease and other diseases of the circulatory system: Secondary | ICD-10-CM | POA: Diagnosis not present

## 2018-08-04 DIAGNOSIS — D128 Benign neoplasm of rectum: Secondary | ICD-10-CM | POA: Diagnosis not present

## 2018-08-04 DIAGNOSIS — Z791 Long term (current) use of non-steroidal anti-inflammatories (NSAID): Secondary | ICD-10-CM | POA: Diagnosis not present

## 2018-08-04 DIAGNOSIS — M8580 Other specified disorders of bone density and structure, unspecified site: Secondary | ICD-10-CM | POA: Diagnosis not present

## 2018-08-04 DIAGNOSIS — D122 Benign neoplasm of ascending colon: Secondary | ICD-10-CM | POA: Diagnosis not present

## 2018-08-04 DIAGNOSIS — M797 Fibromyalgia: Secondary | ICD-10-CM | POA: Insufficient documentation

## 2018-08-04 DIAGNOSIS — M1990 Unspecified osteoarthritis, unspecified site: Secondary | ICD-10-CM | POA: Insufficient documentation

## 2018-08-04 DIAGNOSIS — K648 Other hemorrhoids: Secondary | ICD-10-CM | POA: Insufficient documentation

## 2018-08-04 DIAGNOSIS — I7300 Raynaud's syndrome without gangrene: Secondary | ICD-10-CM | POA: Insufficient documentation

## 2018-08-04 DIAGNOSIS — Z79890 Hormone replacement therapy (postmenopausal): Secondary | ICD-10-CM | POA: Diagnosis not present

## 2018-08-04 DIAGNOSIS — Z79899 Other long term (current) drug therapy: Secondary | ICD-10-CM | POA: Diagnosis not present

## 2018-08-04 HISTORY — PX: COLONOSCOPY: SHX5424

## 2018-08-04 HISTORY — PX: POLYPECTOMY: SHX5525

## 2018-08-04 SURGERY — COLONOSCOPY
Anesthesia: Moderate Sedation

## 2018-08-04 MED ORDER — MEPERIDINE HCL 100 MG/ML IJ SOLN
INTRAMUSCULAR | Status: DC | PRN
  Administered 2018-08-04: 25 mg
  Administered 2018-08-04: 50 mg

## 2018-08-04 MED ORDER — MEPERIDINE HCL 100 MG/ML IJ SOLN
INTRAMUSCULAR | Status: AC
  Filled 2018-08-04: qty 2

## 2018-08-04 MED ORDER — SODIUM CHLORIDE 0.9 % IV SOLN
INTRAVENOUS | Status: DC
  Administered 2018-08-04: 08:00:00 via INTRAVENOUS

## 2018-08-04 MED ORDER — MIDAZOLAM HCL 5 MG/5ML IJ SOLN
INTRAMUSCULAR | Status: DC | PRN
  Administered 2018-08-04 (×2): 2 mg via INTRAVENOUS

## 2018-08-04 MED ORDER — MIDAZOLAM HCL 5 MG/5ML IJ SOLN
INTRAMUSCULAR | Status: AC
  Filled 2018-08-04: qty 10

## 2018-08-04 NOTE — Op Note (Signed)
[facility] Patient Name: Monica [name] Procedure Date: 08/04/2018 8:08 AM MRN: [id number] Date of Birth: 08-Sep-1956 Attending MD: Barney Drain MD, MD CSN: [id number] Age: 62 Admit Type: Outpatient Procedure:                Colonoscopy WITH COLD SNARE POLYPECTOMY Indications:              Screening for colorectal malignant neoplasm Providers:                Barney Drain MD, MD, Janeece Riggers, RN, Aram Candela Referring MD:             Norwood Levo. Simpson MD, MD Medicines:                Meperidine 75 mg IV, Midazolam 4 mg IV Complications:            No immediate complications. Estimated Blood Loss:     Estimated blood loss was minimal. Procedure:                Pre-Anesthesia Assessment:                           - Prior to the procedure, a History and Physical                            was performed, and patient medications and                            allergies were reviewed. The patient's tolerance of                            previous anesthesia was also reviewed. The risks                            and benefits of the procedure and the sedation                            options and risks were discussed with the patient.                            All questions were answered, and informed consent                            was obtained. Prior Anticoagulants: The patient has                            taken aspirin, last dose was 2 days prior to                            procedure. ASA Grade Assessment: II - A patient                            with mild systemic disease. After reviewing the                            risks and benefits, the patient was deemed in  satisfactory condition to undergo the procedure.                            After obtaining informed consent, the colonoscope                            was passed under direct vision. Throughout the                            procedure, the patient's blood pressure, pulse, and                        oxygen saturations were monitored continuously. The                            [id number]-[id number] ([id number]) scope was introduced through                            the anus and advanced to the the cecum, identified                            by appendiceal orifice and ileocecal valve. The                            colonoscopy was somewhat difficult due to a                            tortuous colon and the patient's oxygen                            desaturation ON NASAL CANULA. Successful completion                            of the procedure was aided by straightening and                            shortening the scope to obtain bowel loop                            reduction, administering oxygen VIA MASK and                            COLOWRAP. The patient tolerated the procedure well.                            The quality of the bowel preparation was excellent.                            The ileocecal valve, appendiceal orifice, and                            rectum were photographed. Scope In: 8:52:33 AM Scope Out: 9:10:05 AM Scope Withdrawal Time: 0 hours 14 minutes 5 seconds  Total Procedure Duration: 0 hours 17 minutes 32 seconds  Findings:      Two sessile polyps were found in the rectum and ascending colon. The       polyps were 3 to 5 mm in size. These polyps were removed with a cold       snare. Resection and retrieval were complete.      Internal hemorrhoids were found. The hemorrhoids were small.      The recto-sigmoid colon and sigmoid colon were mildly redundant. Impression:               - Two 3 to 5 mm polyps in the rectum and in the                            ascending colon, removed with a cold snare.                            Resected and retrieved.                           - Internal hemorrhoids.                           - Redundant colon. Moderate Sedation:      Moderate (conscious) sedation was administered by the endoscopy nurse       and  supervised by the endoscopist. The following parameters were       monitored: oxygen saturation, heart rate, blood pressure, and response       to care. Total physician intraservice time was 31 minutes. Recommendation:           - Patient has a contact number available for                            emergencies. The signs and symptoms of potential                            delayed complications were discussed with the                            patient. Return to normal activities tomorrow.                            Written discharge instructions were provided to the                            patient.                           - High fiber diet.                           - Continue present medications.                           - Await pathology results.                           - Repeat colonoscopy in 5-10 years for surveillance. Procedure Code(s):        ---  Professional ---                           [phone], Colonoscopy, flexible; with removal of                            tumor(s), polyp(s), or other lesion(s) by snare                            technique                           99153, Moderate sedation; each additional 15                            minutes intraservice time                           G0500, Moderate sedation services provided by the                            same physician or other qualified health care                            professional performing a gastrointestinal                            endoscopic service that sedation supports,                            requiring the presence of an independent trained                            observer to assist in the monitoring of the                            patient's level of consciousness and physiological                            status; initial 15 minutes of intra-service time;                            patient age 58 years or older (additional time may                            be reported with [phone],  as appropriate) Diagnosis Code(s):        --- Professional ---                           Z12.11, Encounter for screening for malignant                            neoplasm of colon                           K62.1, Rectal polyp  D12.2, Benign neoplasm of ascending colon                           K64.8, Other hemorrhoids                           Q43.8, Other specified congenital malformations of                            intestine CPT copyright 2018 [employer]. All rights reserved. The codes documented in this report are preliminary and upon coder review may  be revised to meet current compliance requirements. Barney Drain, MD Barney Drain MD, MD 08/04/2018 9:36:31 AM This report has been signed electronically. Number of Addenda: 0

## 2018-08-04 NOTE — H&P (Signed)
Primary Care Physician:  Lemmie Evens, MD Primary Gastroenterologist:  Dr. Oneida Alar  Pre-Procedure History & Physical: HPI:  Monica [name] is a 62 y.o. female here for [place].  Past Medical History:  Diagnosis Date  . Arthritis   . Fibromyalgia   . Macular degeneration    Left eye  . Osteopenia 05/2018   T score -2.3 wax 11% / 1.8% overall stable from prior DEXA  . Raynaud disease   . Reiters syndrome   . Sleep disturbance   . Vaginal dryness     Past Surgical History:  Procedure Laterality Date  . BREAST SURGERY     LEFT BR LUMP-Benign    Prior to Admission medications   Medication Sig Start Date End Date Taking? Authorizing Provider  acetaminophen (TYLENOL) 500 MG tablet Take 500 mg by mouth as needed for moderate pain.   Yes [provider]  Cholecalciferol (VITAMIN D) 2000 units tablet Take 2,000 Units by mouth daily.    Yes [provider]  diclofenac sodium (VOLTAREN) 1 % GEL Apply 3 grams to 3 large joints up to 3 times daily Patient taking differently: Apply 2 g topically 3 (three) times daily as needed (joint pain). Apply 3 grams to 3 large joints up to 3 times daily 07/07/18  Yes Ofilia Neas, PA-C  Estradiol (VAGIFEM) 10 MCG TABS vaginal tablet Place 1 tablet (10 mcg total) vaginally 2 (two) times a week. 12/26/17  Yes Fontaine, Belinda Block, MD  Folic Acid-Vit G2-XBM W41 (FABB) 2.2-25-1 MG TABS Take 1 tablet by mouth daily. 07/07/18  Yes [name] M, PA-C  HUMIRA PEN 40 MG/0.4ML PNKT INJECT 1 PEN UNDER THE SKIN EVERY 14 DAYS. Patient taking differently: Inject 40 mg into the skin every 14 (fourteen) days.  06/19/18  Yes Deveshwar, Abel Presto, MD  Lifitegrast Shirley Friar) 5 % SOLN Apply 1 drop to eye daily as needed (Dry eye).    Yes [provider]  LOSARTAN POTASSIUM PO Take 25 mg by mouth daily.    Yes [provider]  sulfaSALAzine (AZULFIDINE) 500 MG EC tablet Take 2 tablets by mouth twice daily. Patient taking  differently: Take 1,000 mg by mouth 2 (two) times daily. Take 2 tablets by mouth twice daily. 07/07/18  Yes Ofilia Neas, PA-C    Allergies as of 05/22/2018  . (No Known Allergies)    Family History  Problem Relation Age of Onset  . Hypertension Sister   . Heart disease Brother   . Hyperlipidemia Brother   . Alzheimer's disease Mother   . Osteoarthritis Mother   . High Cholesterol Sister   . Heart attack Brother   . Heart disease Brother   . Healthy Son   . Healthy Son     Social History   Socioeconomic History  . Marital status: Married    Spouse name: Not on file  . Number of children: Not on file  . Years of education: Not on file  . Highest education level: Not on file  Occupational History  . Not on file  Social Needs  . Financial resource strain: Not on file  . Food insecurity:    Worry: Not on file    Inability: Not on file  . Transportation needs:    Medical: Not on file    Non-medical: Not on file  Tobacco Use  . Smoking status: Never Smoker  . Smokeless tobacco: Never Used  Substance and Sexual Activity  . Alcohol use: Not Currently  .  Drug use: Never  . Sexual activity: Yes    Birth control/protection: Post-menopausal    Comment: 1st intercourse 62 yo- Fewer than 5 partners  Lifestyle  . Physical activity:    Days per week: Not on file    Minutes per session: Not on file  . Stress: Not on file  Relationships  . Social connections:    Talks on phone: Not on file    Gets together: Not on file    Attends religious service: Not on file    Active member of club or organization: Not on file    Attends meetings of clubs or organizations: Not on file    Relationship status: Not on file  . Intimate partner violence:    Fear of current or ex partner: Not on file    Emotionally abused: Not on file    Physically abused: Not on file    Forced sexual activity: Not on file  Other Topics Concern  . Not on file  Social History Narrative  . Not on file     Review of Systems: See HPI, otherwise negative ROS   Physical Exam: BP 133/82   Pulse 82   Temp 98.4 F (36.9 C) (Oral)   Resp 12   Ht 5\' 4"  (1.626 m)   Wt 64.4 kg   SpO2 100%   BMI 24.37 kg/m  General:   Alert,  pleasant and cooperative in NAD Head:  Normocephalic and atraumatic. Neck:  Supple; Lungs:  Clear throughout to auscultation.    Heart:  Regular rate and rhythm. Abdomen:  Soft, nontender and nondistended. Normal bowel sounds, without guarding, and without rebound.   Neurologic:  Alert and  oriented x4;  grossly normal neurologically.  Impression/Plan:    SCREENING  Plan:  1. TCS TODAY DISCUSSED PROCEDURE, BENEFITS, & RISKS: < 1% chance of medication reaction, bleeding, perforation, or rupture of spleen/liver.

## 2018-08-04 NOTE — Discharge Instructions (Signed)
You had 2 small polyps removed. You have SMALL internal hemorrhoids. ° ° °DRINK WATER TO KEEP YOUR URINE LIGHT YELLOW. ° °FOLLOW A HIGH FIBER DIET. AVOID ITEMS THAT CAUSE BLOATING & GAS. SEE INFO BELOW. ° °YOUR BIOPSY RESULTS WILL BE BACK IN 5 BUSINESS DAYS. ° °Next colonoscopy in 5-10 years.  ° ° °Colonoscopy °Care After °Read the instructions outlined below and refer to this sheet in the next week. These discharge instructions provide you with general information on caring for yourself after you leave the hospital. While your treatment has been planned according to the most current medical practices available, unavoidable complications occasionally occur. If you have any problems or questions after discharge, call DR. Sharmel Ballantine, [phone]. ° °ACTIVITY °· You may resume your regular activity, but move at a slower pace for the next 24 hours.  °· Take frequent rest periods for the next 24 hours.  °· Walking will help get rid of the air and reduce the bloated feeling in your belly (abdomen).  °· No driving for 24 hours (because of the medicine (anesthesia) used during the test).  °· You may shower.  °· Do not sign any important legal documents or operate any machinery for 24 hours (because of the anesthesia used during the test).  °·  °NUTRITION °· Drink plenty of fluids.  °· You may resume your normal diet as instructed by your doctor.  °· Begin with a light meal and progress to your normal diet. Heavy or fried foods are harder to digest and may make you feel sick to your stomach (nauseated).  °· Avoid alcoholic beverages for 24 hours or as instructed.  °·  °MEDICATIONS °· You may resume your normal medications. °·  °WHAT YOU CAN EXPECT TODAY °· Some feelings of bloating in the abdomen.  °· Passage of more gas than usual.  °· Spotting of blood in your stool or on the toilet paper °· .  °IF YOU HAD POLYPS REMOVED DURING THE COLONOSCOPY: °· Eat a soft diet IF YOU HAVE NAUSEA, BLOATING, ABDOMINAL PAIN, OR VOMITING. °·     °FINDING OUT THE RESULTS OF YOUR TEST °Not all test results are available during your visit. DR. Shalisha Clausing WILL CALL YOU WITHIN 14 DAYS OF YOUR PROCEDUE WITH YOUR RESULTS. Do not assume everything is normal if you have not heard from DR. Keigen Caddell, CALL HER OFFICE AT [phone]. ° °SEEK IMMEDIATE MEDICAL ATTENTION AND CALL THE OFFICE: [phone] IF: °· You have more than a spotting of blood in your stool.  °· Your belly is swollen (abdominal distention).  °· You are nauseated or vomiting.  °· You have a temperature over 101F.  °· You have abdominal pain or discomfort that is severe or gets worse throughout the day. ° ° °High-Fiber Diet °A high-fiber diet changes your normal diet to include more whole grains, legumes, fruits, and vegetables. Changes in the diet involve replacing refined carbohydrates with unrefined foods. The calorie level of the diet is essentially unchanged. The Dietary Reference Intake (recommended amount) for adult males is 38 grams per day. For adult females, it is 25 grams per day. Pregnant and lactating women should consume 28 grams of fiber per day. Fiber is the intact part of a plant that is not broken down during digestion. Functional fiber is fiber that has been isolated from the plant to provide a beneficial effect in the body. ° °PURPOSE °· Increase stool bulk.  °· Ease and regulate bowel movements.  °· Lower cholesterol.  °· REDUCE RISK   OF COLON CANCER ° °INDICATIONS THAT YOU NEED MORE FIBER °· Constipation and hemorrhoids.  °· Uncomplicated diverticulosis (intestine condition) and irritable bowel syndrome.  °· Weight management.  °· As a protective measure against hardening of the arteries (atherosclerosis), diabetes, and cancer.  ° °GUIDELINES FOR INCREASING FIBER IN THE DIET °· Start adding fiber to the diet slowly. A gradual increase of about 5 more grams (2 slices of whole-wheat bread, 2 servings of most fruits or vegetables, or 1 bowl of high-fiber cereal) per day is best. Too  rapid an increase in fiber may result in constipation, flatulence, and bloating.  °· Drink enough water and fluids to keep your urine clear or pale yellow. Water, juice, or caffeine-free drinks are recommended. Not drinking enough fluid may cause constipation.  °· Eat a variety of high-fiber foods rather than one type of fiber.  °· Try to increase your intake of fiber through using high-fiber foods rather than fiber pills or supplements that contain small amounts of fiber.  °· The goal is to change the types of food eaten. Do not supplement your present diet with high-fiber foods, but replace foods in your present diet.  ° °INCLUDE A VARIETY OF FIBER SOURCES °· Replace refined and processed grains with whole grains, canned fruits with fresh fruits, and incorporate other fiber sources. White rice, white breads, and most bakery goods contain little or no fiber.  °· Brown whole-grain rice, buckwheat oats, and many fruits and vegetables are all good sources of fiber. These include: broccoli, Brussels sprouts, cabbage, cauliflower, beets, sweet potatoes, white potatoes (skin on), carrots, tomatoes, eggplant, squash, berries, fresh fruits, and dried fruits.  °· Cereals appear to be the richest source of fiber. Cereal fiber is found in whole grains and bran. Bran is the fiber-rich outer coat of cereal grain, which is largely removed in refining. In whole-grain cereals, the bran remains. In breakfast cereals, the largest amount of fiber is found in those with "bran" in their names. The fiber content is sometimes indicated on the label.  °· You may need to include additional fruits and vegetables each day.  °· In baking, for 1 cup white flour, you may use the following substitutions:  °· 1 cup whole-wheat flour minus 2 tablespoons.  °· 1/2 cup white flour plus 1/2 cup whole-wheat flour.  ° °Polyps, Colon  °A polyp is extra tissue that grows inside your body. Colon polyps grow in the large intestine. The large intestine, also  called the colon, is part of your digestive system. It is a long, hollow tube at the end of your digestive tract where your body makes and stores stool. °Most polyps are not dangerous. They are benign. This means they are not cancerous. But over time, some types of polyps can turn into cancer. Polyps that are smaller than a pea are usually not harmful. But larger polyps could someday become or may already be cancerous. To be safe, doctors remove all polyps and test them.  ° °WHO GETS POLYPS? °Anyone can get polyps, but certain people are more likely than others. You may have a greater chance of getting polyps if: °· You are over 50.  °· You have had polyps before.  °· Someone in your family has had polyps.  °· Someone in your family has had cancer of the large intestine.  °· Find out if someone in your family has had polyps. You may also be more likely to get polyps if you:  °· Eat a lot of   fatty foods  · Smoke  · Drink alcohol  · Do not exercise · Eat too much  ° °PREVENTION °There is not one sure way to prevent polyps. You might be able to lower your risk of getting them if you: °· Eat more fruits and vegetables and less fatty food.  °· Do not smoke.  °· Avoid alcohol.  °· Exercise every day.  °· Lose weight if you are overweight.  °· Eating more calcium and folate can also lower your risk of getting polyps. Some foods that are rich in calcium are milk, cheese, and broccoli. Some foods that are rich in folate are chickpeas, kidney beans, and spinach.  ° ° °

## 2018-08-08 NOTE — Progress Notes (Signed)
CC'D TO PCP AND ON RECALL  °

## 2018-08-08 NOTE — Progress Notes (Signed)
Pt is aware.  

## 2018-08-09 ENCOUNTER — Encounter (HOSPITAL_COMMUNITY): Payer: Self-pay | Admitting: Gastroenterology

## 2018-09-05 ENCOUNTER — Other Ambulatory Visit: Payer: Self-pay | Admitting: Rheumatology

## 2018-09-05 NOTE — Telephone Encounter (Signed)
Last Visit: 07/07/18 Next Visit: 12/12/18 Labs: 07/07/18 WNL TB Gold: 01/25/18 Neg  Okay to refill per Dr. Estanislado Pandy

## 2018-09-26 ENCOUNTER — Other Ambulatory Visit: Payer: Self-pay | Admitting: *Deleted

## 2018-09-26 DIAGNOSIS — Z79899 Other long term (current) drug therapy: Secondary | ICD-10-CM

## 2018-09-27 LAB — CBC WITH DIFFERENTIAL/PLATELET
ABSOLUTE MONOCYTES: 437 {cells}/uL (ref 200–950)
BASOS ABS: 29 {cells}/uL (ref 0–200)
BASOS PCT: 0.6 %
EOS ABS: 336 {cells}/uL (ref 15–500)
Eosinophils Relative: 7 %
HEMATOCRIT: 42.6 % (ref 35.0–45.0)
HEMOGLOBIN: 13.6 g/dL (ref 11.7–15.5)
Lymphs Abs: 1771 cells/uL (ref 850–3900)
MCH: 29.6 pg (ref 27.0–33.0)
MCHC: 31.9 g/dL — AB (ref 32.0–36.0)
MCV: 92.8 fL (ref 80.0–100.0)
MPV: 8.8 fL (ref 7.5–12.5)
Monocytes Relative: 9.1 %
NEUTROS ABS: 2227 {cells}/uL (ref 1500–7800)
Neutrophils Relative %: 46.4 %
Platelets: 317 10*3/uL (ref 140–400)
RBC: 4.59 10*6/uL (ref 3.80–5.10)
RDW: 12.1 % (ref 11.0–15.0)
Total Lymphocyte: 36.9 %
WBC: 4.8 10*3/uL (ref 3.8–10.8)

## 2018-09-27 LAB — COMPLETE METABOLIC PANEL WITH GFR
AG Ratio: 2 (calc) (ref 1.0–2.5)
ALBUMIN MSPROF: 4.7 g/dL (ref 3.6–5.1)
ALT: 19 U/L (ref 6–29)
AST: 25 U/L (ref 10–35)
Alkaline phosphatase (APISO): 68 U/L (ref 33–130)
BUN: 12 mg/dL (ref 7–25)
CALCIUM: 9.8 mg/dL (ref 8.6–10.4)
CO2: 27 mmol/L (ref 20–32)
CREATININE: 0.93 mg/dL (ref 0.50–0.99)
Chloride: 105 mmol/L (ref 98–110)
GFR, EST NON AFRICAN AMERICAN: 66 mL/min/{1.73_m2} (ref >=60)
GFR, Est African American: 77 mL/min/{1.73_m2} (ref >=60)
GLOBULIN: 2.4 g/dL (ref 1.9–3.7)
GLUCOSE: 81 mg/dL (ref 65–99)
Potassium: 3.9 mmol/L (ref 3.5–5.3)
SODIUM: 141 mmol/L (ref 135–146)
Total Bilirubin: 0.4 mg/dL (ref 0.2–1.2)
Total Protein: 7.1 g/dL (ref 6.1–8.1)

## 2018-09-27 NOTE — Progress Notes (Signed)
CBC WNL

## 2018-10-12 ENCOUNTER — Telehealth: Payer: Self-pay | Admitting: Rheumatology

## 2018-10-12 NOTE — Telephone Encounter (Signed)
Patient states she has been really sick with a cold . Patient states that her cold symptoms have improved. 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 Patient states she is not on any medication for the cold sore. Patient wants to know if she should hold her Humira. Patient advised per [name], PA-C patient advised to hold Humira.

## 2018-10-12 NOTE — Telephone Encounter (Signed)
error 

## 2018-10-12 NOTE — Telephone Encounter (Signed)
Patient called stating she is due to take her Humira injection today and was told to contact Dr. Arlean Hopping office before taking the injection.  Patient requested a return call.

## 2018-11-21 ENCOUNTER — Telehealth: Payer: Self-pay | Admitting: Rheumatology

## 2018-11-21 NOTE — Telephone Encounter (Signed)
Please advise patient to schedule an appointment with dermatologist.  She should call us to give [place] details about her visit.

## 2018-11-21 NOTE — Telephone Encounter (Signed)
Evette Doffing from [facility] called stating that a [facility] nurse who frequently calls patients reported that patient told her that she has discoloration on both breasts and since she has been taking Humira we wanted to make sure Dr. Estanislado Pandy was informed.  If you have any questions, please call back at [phone]

## 2018-11-21 NOTE — Telephone Encounter (Signed)
Patient advised to schedule an appointment with dermatologist.  Advised to call us to give [place] details about her visit. Patient verbalized understanding.

## 2018-11-22 ENCOUNTER — Other Ambulatory Visit: Payer: Self-pay | Admitting: Physician Assistant

## 2018-11-22 NOTE — Telephone Encounter (Signed)
Okay to refill meds 

## 2018-11-22 NOTE — Telephone Encounter (Signed)
Last Visit: 07/07/18 Next Visit: 12/12/18 Labs: 09/26/18 CBC WNL.CMP WNL

## 2018-11-28 NOTE — Progress Notes (Signed)
Office Visit Note  Patient: Monica [name]             Date of Birth: 07/01/1956           MRN: [id number]             PCP: Lemmie Evens, MD Referring: Lemmie Evens, MD Visit Date: 12/12/2018 Occupation: @GUAROCC @  Subjective:  Pain in both hands    History of Present Illness: Monica [name] is a 63 y.o. female with history of inflammatory arthritis. She is on SSZ 500 mg 2 tablets BID and Humira 40 mg sq injections every 14 days.  She denies missing any doses recently.  She denies any recent infections.  She reports she has been having left SI joint pain and wanted a cortisone injection but woke up this morning and has no discomfort at this time.  She has occasional radiating pain or numbness down the left leg.  She states she has been experiencing pain in both hands but denies any joint swelling.  She reports intermittent numbness in the right hand.  She has occasional symptoms at night.  She reports she has been cleaning recently and wondering if this has exacerbated the symptoms. She denies any neck pain or stiffness.  She does have a known history of carpal tunnel and has worn night splints in the past.    She denies any iritis flares.  She continues to have eye dryness.      Activities of Daily Living:  Patient reports morning stiffness for 45 minutes.   Patient Reports nocturnal pain.  Difficulty dressing/grooming: Denies Difficulty climbing stairs: Reports Difficulty getting out of chair: Reports Difficulty using hands for taps, buttons, cutlery, and/or writing: Denies  Review of Systems  Constitutional: Positive for fatigue.  HENT: Negative for mouth sores, mouth dryness and nose dryness.   Eyes: Positive for dryness. Negative for pain and visual disturbance.  Respiratory: Negative for cough, hemoptysis, shortness of breath and difficulty breathing.   Cardiovascular: Negative for chest pain, palpitations, hypertension and swelling in legs/feet.  Gastrointestinal:  Negative for blood in stool, constipation and diarrhea.  Endocrine: Negative for increased urination.  Genitourinary: Negative for painful urination.  Musculoskeletal: Positive for arthralgias, joint pain and morning stiffness. Negative for joint swelling, myalgias, muscle weakness, muscle tenderness and myalgias.  Skin: Negative for color change, pallor, rash, hair loss, nodules/bumps, skin tightness, ulcers and sensitivity to sunlight.  Allergic/Immunologic: Negative for susceptible to infections.  Neurological: Negative for dizziness, numbness, headaches and weakness.  Hematological: Negative for swollen glands.  Psychiatric/Behavioral: Positive for sleep disturbance. Negative for depressed mood. The patient is not nervous/anxious.     PMFS History:  Patient Active Problem List   Diagnosis Date Noted  . Special screening for malignant neoplasms, colon   . High risk medication use 01/13/2017  . History of iritis 01/13/2017  . Essential hypertension 01/13/2017  . Macular degeneration 01/13/2017  . 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 Leg weakness 07/08/2014  . Difficulty walking 07/08/2014  . Vaginal dryness   . 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 Raynaud disease   . Fibromyalgia   . Inflammatory arthritis   . Osteopenia     Past Medical History:  Diagnosis Date  . Arthritis   . Fibromyalgia   . Macular degeneration    Left eye  . Osteopenia 05/2018   T score -2.3 wax 11% / 1.8% overall stable from prior DEXA  . Raynaud disease   .  Reiters syndrome   . Sleep disturbance   . Vaginal dryness     Family History  Problem Relation Age of Onset  . Hypertension Sister   . Heart disease Brother   . Hyperlipidemia Brother   . Alzheimer's disease Mother   . Osteoarthritis Mother   . High Cholesterol Sister   . Heart attack Brother   . Heart disease Brother   . Healthy Son   . Healthy Son    Past Surgical History:  Procedure Laterality  Date  . BREAST SURGERY     LEFT BR LUMP-Benign  . COLONOSCOPY N/A 08/04/2018   Procedure: COLONOSCOPY;  Surgeon: Danie Binder, MD;  Location: [facility];  Service: Endoscopy;  Laterality: N/A;  8:30  . POLYPECTOMY  08/04/2018   Procedure: POLYPECTOMY;  Surgeon: Danie Binder, MD;  Location: [facility];  Service: Endoscopy;;   Social History   Social History Narrative  . Not on file   Immunization History  Administered Date(s) Administered  . Influenza,inj,Quad PF,6+ Mos 08/24/2012, 09/25/2013, 11/13/2014     Objective: Vital Signs: BP 126/81 (BP Location: Left Arm, Patient Position: Sitting, Cuff Size: Small)   Pulse 74   Resp 12   Ht 5\' 4"  (1.626 m)   Wt 141 lb 9.6 oz (64.2 kg)   BMI 24.31 kg/m    Physical Exam Vitals signs and nursing note reviewed.  Constitutional:      Appearance: She is well-developed.  HENT:     Head: Normocephalic and atraumatic.  Eyes:     Conjunctiva/sclera: Conjunctivae normal.  Neck:     Musculoskeletal: Normal range of motion.  Cardiovascular:     Rate and Rhythm: Normal rate and regular rhythm.     Heart sounds: Normal heart sounds.  Pulmonary:     Effort: Pulmonary effort is normal.     Breath sounds: Normal breath sounds.  Abdominal:     General: Bowel sounds are normal.     Palpations: Abdomen is soft.  Lymphadenopathy:     Cervical: No cervical adenopathy.  Skin:    General: Skin is warm and dry.     Capillary Refill: Capillary refill takes less than 2 seconds.  Neurological:     Mental Status: She is alert and oriented to person, place, and time.  Psychiatric:        Behavior: Behavior normal.      Musculoskeletal Exam: C-spine, thoracic spine, and lumbar spine good ROM.  No midline spinal tenderness. No SI joint tenderness.  Shoulder joints, elbow joints, wrist joints, MCPs, PIPs, and DIPs good ROM with no synovitis.  Hip joints, knee joints, ankle joints, MTPs, PIPs, and DIPs good ROM with no synovitis.  No  warmth or effusion of knee joints.  No tenderness or swelling of ankle joints.  No achilles tendonitis or plantar fasciitis.  No tenderness of trochanteric bursa bilaterally.   CDAI Exam: CDAI Score: Not documented Patient Global Assessment: Not documented; Provider Global Assessment: Not documented Swollen: Not documented; Tender: Not documented Joint Exam   Not documented   There is currently no information documented on the homunculus. Go to the Rheumatology activity and complete the homunculus joint exam.  Investigation: No additional findings.  Imaging: No results found.  Recent Labs: Lab Results  Component Value Date   WBC 4.8 09/26/2018   HGB 13.6 09/26/2018   PLT 317 09/26/2018   NA 141 09/26/2018   K 3.9 09/26/2018   CL 105 09/26/2018   CO2 27 09/26/2018  GLUCOSE 81 09/26/2018   BUN 12 09/26/2018   CREATININE 0.93 09/26/2018   BILITOT 0.4 09/26/2018   ALKPHOS 92 01/03/2018   AST 25 09/26/2018   ALT 19 09/26/2018   PROT 7.1 09/26/2018   ALBUMIN 4.0 01/03/2018   CALCIUM 9.8 09/26/2018   GFRAA 77 09/26/2018   QFTBGOLDPLUS Negative 01/25/2018    Speciality Comments: TB gold neg 01/25/18 HIV, Hep, IG, SPEP neg 01/25/18  Procedures:  No procedures performed Allergies: Patient has no known allergies.   Assessment / Plan:     Visit Diagnoses: Inflammatory arthritis: She has no synovitis on exam.  She has not had any recent flares.  She is clinically doing well on sulfasalazine 500 mg 2 tablets twice daily and Humira 40 mg subcutaneous injections every 14 days.  She continues to have intermittent left SI joint pain.  No Achilles tenderness or plantar fasciitis.  She continues to have discomfort in bilateral hands likely due to osteoarthritis.  She will continue on sulfasalazine and Humira as prescribed.  She does not need any refills at this time.  She was advised to notify us if she develops increased joint pain or joint swelling.  She will follow-up in the office in  5 months.  High risk medication use - Sulfasalazine 500 mg 2 tabs po bid, Humira 40 mg sq every 14 days.Last TB gold negative on 01/25/18 and will monitor yearly.  Most recent CBC/CMP within normal limits on 09/26/18.  Will monitor every 3 months and standing orders are in place.   CBC and CMP will be drawn today to monitor for drug toxicity.  TB gold will also be drawn.- Plan: COMPLETE METABOLIC PANEL WITH GFR, CBC with Differential/Platelet, QuantiFERON-TB Gold Plus  History of iritis: She has not had any recent flares.  She continues to have eye dryness.  Chronic left SI joint pain: She has no tenderness on exam today. She has been experiencing left SI joint pain for the past several weeks.  She reports that she is planning on having a left SI joint cortisone injection today but woke up this morning and did not have any discomfort.  She was advised to notify us if she develops increased left SI joint pain and can schedule a cortisone injection in the future.  Raynaud's disease without gangrene: She has not had any symptoms of Raynaud's recently.  She has no digital ulcerations or signs of gangrene.    Fibromyalgia: She has no muscle aches or muscle tenderness at this time.  She continues to have chronic fatigue and insomnia.  The need for regular exercise and good sleep hygiene was discussed.   Spondylosis of lumbar region without myelopathy or radiculopathy: She has good ROM with no discomfort.  No midline spinal tenderness.    Osteopenia of multiple sites: DEXA 05/26/18: Left femoral neck T-score -2.3 BMD 0.59  Essential hypertension  History of macular degeneration   Orders: Orders Placed This Encounter  Procedures  . COMPLETE METABOLIC PANEL WITH GFR  . CBC with Differential/Platelet  . QuantiFERON-TB Gold Plus   No orders of the defined types were placed in this encounter.    Follow-Up Instructions: Return in about 5 months (around 05/14/2019) for Inflammatory arthritis  .   Ofilia Neas, PA-C  Note - This record has been created using Dragon software.  Chart creation errors have been sought, but may not always  have been located. Such creation errors do not reflect on  the standard of medical care.

## 2018-12-11 ENCOUNTER — Other Ambulatory Visit (HOSPITAL_COMMUNITY): Payer: Self-pay | Admitting: Gynecology

## 2018-12-11 DIAGNOSIS — Z1231 Encounter for screening mammogram for malignant neoplasm of breast: Secondary | ICD-10-CM

## 2018-12-12 ENCOUNTER — Encounter: Payer: Self-pay | Admitting: Physician Assistant

## 2018-12-12 ENCOUNTER — Ambulatory Visit: Payer: 59 | Admitting: Physician Assistant

## 2018-12-12 VITALS — BP 126/81 | HR 74 | Resp 12 | Ht 64.0 in | Wt 141.6 lb

## 2018-12-12 DIAGNOSIS — M533 Sacrococcygeal disorders, not elsewhere classified: Secondary | ICD-10-CM | POA: Diagnosis not present

## 2018-12-12 DIAGNOSIS — Z79899 Other long term (current) drug therapy: Secondary | ICD-10-CM

## 2018-12-12 DIAGNOSIS — Z8669 Personal history of other diseases of the nervous system and sense organs: Secondary | ICD-10-CM | POA: Diagnosis not present

## 2018-12-12 DIAGNOSIS — M1990 Unspecified osteoarthritis, unspecified site: Secondary | ICD-10-CM

## 2018-12-12 DIAGNOSIS — G8929 Other chronic pain: Secondary | ICD-10-CM

## 2018-12-12 DIAGNOSIS — I7300 Raynaud's syndrome without gangrene: Secondary | ICD-10-CM

## 2018-12-12 DIAGNOSIS — M797 Fibromyalgia: Secondary | ICD-10-CM

## 2018-12-12 DIAGNOSIS — M8589 Other specified disorders of bone density and structure, multiple sites: Secondary | ICD-10-CM

## 2018-12-12 DIAGNOSIS — M47816 Spondylosis without myelopathy or radiculopathy, lumbar region: Secondary | ICD-10-CM

## 2018-12-12 DIAGNOSIS — I10 Essential (primary) hypertension: Secondary | ICD-10-CM

## 2018-12-13 NOTE — Progress Notes (Signed)
Creatinine borderline elevated.  GFR WNL. Please advise patient to avoid NSAIDs. Rest of lab work WNL.

## 2018-12-14 ENCOUNTER — Ambulatory Visit (HOSPITAL_COMMUNITY)
Admission: RE | Admit: 2018-12-14 | Discharge: 2018-12-14 | Disposition: A | Discharge status: Home / Self Care | Payer: 59 | Source: Ambulatory Visit | Attending: Gynecology | Admitting: Gynecology

## 2018-12-14 DIAGNOSIS — Z1231 Encounter for screening mammogram for malignant neoplasm of breast: Secondary | ICD-10-CM | POA: Diagnosis present

## 2018-12-14 LAB — COMPLETE METABOLIC PANEL WITH GFR
AG Ratio: 2 (calc) (ref 1.0–2.5)
ALT: 22 U/L (ref 6–29)
AST: 22 U/L (ref 10–35)
Albumin: 4.3 g/dL (ref 3.6–5.1)
Alkaline phosphatase (APISO): 73 U/L (ref 37–153)
BUN/Creatinine Ratio: 13 (calc) (ref 6–22)
BUN: 13 mg/dL (ref 7–25)
CO2: 29 mmol/L (ref 20–32)
CREATININE: 1 mg/dL — AB (ref 0.50–0.99)
Calcium: 9.3 mg/dL (ref 8.6–10.4)
Chloride: 104 mmol/L (ref 98–110)
GFR, EST AFRICAN AMERICAN: 70 mL/min/{1.73_m2} (ref >=60)
GFR, Est Non African American: 60 mL/min/{1.73_m2} (ref >=60)
GLOBULIN: 2.2 g/dL (ref 1.9–3.7)
Glucose, Bld: 92 mg/dL (ref 65–99)
Potassium: 4 mmol/L (ref 3.5–5.3)
SODIUM: 139 mmol/L (ref 135–146)
TOTAL PROTEIN: 6.5 g/dL (ref 6.1–8.1)
Total Bilirubin: 0.3 mg/dL (ref 0.2–1.2)

## 2018-12-14 LAB — CBC WITH DIFFERENTIAL/PLATELET
ABSOLUTE MONOCYTES: 549 {cells}/uL (ref 200–950)
BASOS ABS: 18 {cells}/uL (ref 0–200)
BASOS PCT: 0.3 %
EOS ABS: 311 {cells}/uL (ref 15–500)
Eosinophils Relative: 5.1 %
HCT: 39 % (ref 35.0–45.0)
HEMOGLOBIN: 12.9 g/dL (ref 11.7–15.5)
Lymphs Abs: 2068 cells/uL (ref 850–3900)
MCH: 30 pg (ref 27.0–33.0)
MCHC: 33.1 g/dL (ref 32.0–36.0)
MCV: 90.7 fL (ref 80.0–100.0)
MPV: 8.9 fL (ref 7.5–12.5)
Monocytes Relative: 9 %
NEUTROS ABS: 3154 {cells}/uL (ref 1500–7800)
Neutrophils Relative %: 51.7 %
Platelets: 311 10*3/uL (ref 140–400)
RBC: 4.3 10*6/uL (ref 3.80–5.10)
RDW: 12.2 % (ref 11.0–15.0)
TOTAL LYMPHOCYTE: 33.9 %
WBC: 6.1 10*3/uL (ref 3.8–10.8)

## 2018-12-14 LAB — QUANTIFERON-TB GOLD PLUS
NIL: 0.02 IU/mL
QuantiFERON-TB Gold Plus: NEGATIVE
TB1-NIL: 0 [IU]/mL
TB2-NIL: 0 [IU]/mL

## 2018-12-14 NOTE — Progress Notes (Signed)
TB gold negative

## 2018-12-28 ENCOUNTER — Encounter: Payer: 59 | Admitting: Gynecology

## 2019-01-02 ENCOUNTER — Encounter: Payer: Self-pay | Admitting: Rheumatology

## 2019-02-02 ENCOUNTER — Other Ambulatory Visit: Payer: Self-pay | Admitting: Physician Assistant

## 2019-02-02 ENCOUNTER — Other Ambulatory Visit: Payer: Self-pay | Admitting: Rheumatology

## 2019-02-02 NOTE — Telephone Encounter (Signed)
Last visit: 12/12/18 Next Visit: 05/15/19  Okay to refill per Dr. Estanislado Pandy

## 2019-02-02 NOTE — Telephone Encounter (Signed)
Last visit: 12/12/18 Next Visit: 05/15/19 Labs: 12/29/18 Creatinine borderline elevated. GFR WNL.Rest of lab work WNL  Okay to refill per Dr. Estanislado Pandy

## 2019-02-02 NOTE — Telephone Encounter (Signed)
Ok to refill 

## 2019-02-12 ENCOUNTER — Telehealth (INDEPENDENT_AMBULATORY_CARE_PROVIDER_SITE_OTHER): Payer: 59 | Admitting: Rheumatology

## 2019-02-12 ENCOUNTER — Encounter: Payer: Self-pay | Admitting: Rheumatology

## 2019-02-12 ENCOUNTER — Other Ambulatory Visit: Payer: Self-pay | Admitting: Rheumatology

## 2019-02-12 ENCOUNTER — Telehealth: Payer: Self-pay | Admitting: Rheumatology

## 2019-02-12 DIAGNOSIS — M47816 Spondylosis without myelopathy or radiculopathy, lumbar region: Secondary | ICD-10-CM

## 2019-02-12 DIAGNOSIS — M533 Sacrococcygeal disorders, not elsewhere classified: Secondary | ICD-10-CM | POA: Diagnosis not present

## 2019-02-12 DIAGNOSIS — M1990 Unspecified osteoarthritis, unspecified site: Secondary | ICD-10-CM | POA: Diagnosis not present

## 2019-02-12 DIAGNOSIS — G8929 Other chronic pain: Secondary | ICD-10-CM | POA: Diagnosis not present

## 2019-02-12 DIAGNOSIS — Z8679 Personal history of other diseases of the circulatory system: Secondary | ICD-10-CM

## 2019-02-12 DIAGNOSIS — I7300 Raynaud's syndrome without gangrene: Secondary | ICD-10-CM | POA: Diagnosis not present

## 2019-02-12 DIAGNOSIS — M797 Fibromyalgia: Secondary | ICD-10-CM

## 2019-02-12 DIAGNOSIS — Z79899 Other long term (current) drug therapy: Secondary | ICD-10-CM

## 2019-02-12 DIAGNOSIS — Z8669 Personal history of other diseases of the nervous system and sense organs: Secondary | ICD-10-CM

## 2019-02-12 DIAGNOSIS — M8589 Other specified disorders of bone density and structure, multiple sites: Secondary | ICD-10-CM

## 2019-02-12 MED ORDER — PREDNISONE 5 MG PO TABS: ORAL_TABLET | ORAL | 0 refills | Status: DC

## 2019-02-12 MED ORDER — TIZANIDINE HCL 4 MG PO TABS: 4.0000 mg | ORAL_TABLET | Freq: Every day | ORAL | 0 refills | Status: DC

## 2019-02-12 NOTE — Progress Notes (Signed)
Virtual Visit via Video Note  I connected with Monica [name] on 02/12/19 at 10:00 AM EDT by a video enabled telemedicine application and verified that I am speaking with the correct person using two identifiers.  Location: Patient: Home Provider: At the office    I discussed the limitations of evaluation and management by telemedicine and the availability of in person appointments. The patient expressed understanding and agreed to proceed. This service was conducted via virtual visit.  Both audio and visual tools were used.  The patient was located at home. I was located in my office.  Consent was obtained prior to the virtual visit and is aware of possible charges through their insurance for this visit.  The patient is an established patient.  Dr. Estanislado Pandy, MD conducted the virtual visit and [name], PA-C acted as scribe during the service.  Office staff helped with scheduling follow up visits after the service was conducted.   CC: Lower back pain   History of Present Illness: Patient is a 63 year old female with a past medical history of inflammatory arthritis, fibromyalgia, and Raynaud's.  She is on Humira 40 mg sq injections every 14 days and sulfasalazine 500 mg 2 tablet by mouth twice daily.  She reports that for the past 3 weeks she has been experiencing increased lower back pain. She states the pain is most severe in the left SI joint. She described the pain as a deep ache. She is experiencing radiating pain down the left lower extremity to the back of the left knee.  She has noticed increased stiffness in her lower back.  She has been walking more for exercise.  She denies any other joint pain or joint swelling.   Review of Systems  Constitutional: Positive for malaise/fatigue. Negative for fever.  HENT:       +Dry mouth  Eyes: Negative for photophobia, pain, discharge and redness.       +Dry eyes  Respiratory: Negative for cough, shortness of breath and wheezing.    Cardiovascular: Negative for chest pain and palpitations.  Gastrointestinal: Negative for blood in stool, constipation and diarrhea.  Genitourinary: Negative for dysuria.  Musculoskeletal: Positive for joint pain. Negative for back pain, myalgias and neck pain. Falls:    Skin: Negative for rash.  Neurological: Negative for dizziness and headaches.  Psychiatric/Behavioral: Negative for depression. The patient is not nervous/anxious and does not have insomnia.      Observations/Objective: Physical Exam  Constitutional: She is oriented to person, place, and time and well-developed, well-nourished, and in no distress.  HENT:  Head: Normocephalic and atraumatic.  Eyes: Conjunctivae are normal.  Pulmonary/Chest: Effort normal.  Neurological: She is alert and oriented to person, place, and time.  Psychiatric: Mood, memory, affect and judgment normal.   Patient reports morning stiffness for 30  minutes.   Patient reports nocturnal pain.  Difficulty dressing/grooming: Denies Difficulty climbing stairs: Reports Difficulty getting out of chair: Reports Difficulty using hands for taps, buttons, cutlery, and/or writing: Denies  Assessment and Plan: Inflammatory arthritis: She has been experiencing left SI joint pain and left sided sciatica for the past 3 weeks.  She has no other joint pain or joint swelling.  She has not had any recent iritis flares.  She has no achilles tendonitis or plantar fasciitis.  She is taking sulfasalazine 500 mg 2 tablets twice daily and Humira 40 mg subcutaneous injections every 14 days. She has not missed any doses of Humira or SSZ recently.  She will  continue on this current treatment regimen.  We will send in a prednisone taper starting at 20 mg and she will taper by 5 mg every 4 days.  She was advised to notify us if her left SI joint pain returns following the prednisone taper.  We will send in zanaflex 4 mg po at bedtime PRN for muscle spasms. At her follow up visit  we will schedule a left SI joint injection and obtain a X-ray in the office.  She will follow up in 3 months.    High risk medication use - Sulfasalazine 500 mg 2 tabs po bid, Humira 40 mg sq every 14 days.  CBC and CMP were drawn on 12/12/18.  She will be due to update CBC and CMP in June and every 3 months to monitor for drug toxicity.  TB gold negative on 12/12/18.  She was advised to hold SSZ and Humira if she develops an injection and to resume once the infection has cleared.  We dicussed the importance of social distancing and following the standard precautions recommended by the CDC.    History of iritis: She has not had any recent flares.  She continues to have eye dryness and uses eye drops PRN.    Chronic left SI joint pain: She has been experiencing left SI joint pain for the past 3 weeks.  She describes it as a deep ache and started experiencing left sided sciatica yesterday.  We will send in a prednisone taper. She was advised to monitor BP.  We will send in zanaflex 4 mg po at bedtime PRN for muscle spasms. We discussed potential side effects of zanaflex.  If her pain persists, we will schedule a left SI joint cortisone injection in the future.  We will obtain a X-ray at her follow up visit and proceed with MRI in the future.   Raynaud's disease without gangrene: She has no digital ulcerations or signs or gangrene.     Fibromyalgia: Her fibromyalgia has been well controlled.    Spondylosis of lumbar region without myelopathy or radiculopathy: She has been having increased lower pain pain for the past 3 weeks.  She has left sided sciatica.  She has noticed increased stiffness.  She has been walking more for exercise.    Osteopenia of multiple sites: DEXA 05/26/18: Left femoral neck T-score -2.3 BMD 0.59  Follow Up Instructions: She will follow up in 1 month.  A prednisone taper will be sent today.  A prescription for zanaflex was sent to the pharmacy.    I discussed the assessment  and treatment plan with the patient. The patient was provided an opportunity to ask questions and all were answered. The patient agreed with the plan and demonstrated an understanding of the instructions.   The patient was advised to call back or seek an in-person evaluation if the symptoms worsen or if the condition fails to improve as anticipated.  I provided 30 minutes of non-face-to-face time during this encounter. Bo Merino, MD   Scribed by-   Ofilia Neas, PA-C

## 2019-02-12 NOTE — Telephone Encounter (Signed)
Patient having a lot of pain with her lt hip. Pain has been off/ on for three weeks now. Patient had a bad night last night with pain. Patient thinks she needs an injection. Patient uses [facility] in [place].

## 2019-02-12 NOTE — Telephone Encounter (Signed)
Patient is scheduled for a virtual office visit today with Dr. Estanislado Pandy to discuss.

## 2019-02-12 NOTE — Telephone Encounter (Signed)
Last Visit: 12/12/2018 Next Visit: 05/15/2019 Labs: 12/12/2018 Creatinine borderline elevated. GFR WNL. Rest of lab work WNL. TB Gold: 12/12/2018 negative   Okay to refill per Dr. Estanislado Pandy.

## 2019-02-21 ENCOUNTER — Encounter: Payer: 59 | Admitting: Gynecology

## 2019-03-06 ENCOUNTER — Other Ambulatory Visit: Payer: Self-pay | Admitting: Rheumatology

## 2019-03-06 NOTE — Telephone Encounter (Signed)
Last Visit: 02/12/19 Next Visit: 03/27/19 Labs: 12/12/18 Creatinine borderline elevated. GFR WNL. Rest of lab work WNL.   Okay to refill per Dr. Estanislado Pandy

## 2019-03-15 NOTE — Progress Notes (Signed)
Office Visit Note  Patient: Monica [name]             Date of Birth: 08/02/1956           MRN: [id number]             PCP: Lemmie Evens, MD Referring: Lemmie Evens, MD Visit Date: 03/27/2019 Occupation: @GUAROCC @  Subjective:  Left SI joint pain    History of Present Illness: Monica [name] is a 63 y.o. female with history of inflammatory arthritis, iritis, and fibromyalgia. She is on Humira 40 mg sq injections every 14 days and Sulfasalazine 500 mg 2 tablets BID.  She denies missing any doses recently.  She denies any recent infections.  She states she continues to have left SI joint pain and left sided sciatica.  She started on a prednisone taper after her virtual visit on 02/12/19.  She tried zanaflex 4 mg po at bedtime but it was not effective.  She did not notice much improvement after taking prednisone. She has been taking tylenol for pain relief at night.  She has tried stretching and using a tennis ball without much relief.  She reports the right SI joint cortisone injection helped in the past. She would like a left SI joint cortisone injection today. She reports she continues to have intermittent bilateral hand pain and left knee joint pain.  She denies any joint swelling. She states her hand pain is exacerbated by gardening.  She denies any iritis flare.  She has been experiencing increased eye dryness and has been using drops more frequently.  She denies any muscle aches or muscle tenderness due to fibromyalgia.    Activities of Daily Living:  Patient reports morning stiffness for 20-60 minutes.   Patient Reports nocturnal pain.  Difficulty dressing/grooming: Denies Difficulty climbing stairs: Denies Difficulty getting out of chair: Denies Difficulty using hands for taps, buttons, cutlery, and/or writing: Denies  Review of Systems  Constitutional: Positive for fatigue.  HENT: Negative for mouth sores, mouth dryness and nose dryness.   Eyes: Positive for dryness.  Negative for pain, itching and visual disturbance.  Respiratory: Negative for cough, hemoptysis, shortness of breath, wheezing and difficulty breathing.   Cardiovascular: Negative for chest pain, palpitations, hypertension and swelling in legs/feet.  Gastrointestinal: Negative for blood in stool, constipation and diarrhea.  Endocrine: Negative for increased urination.  Genitourinary: Negative for painful urination and pelvic pain.  Musculoskeletal: Positive for arthralgias, joint pain and morning stiffness. Negative for joint swelling, myalgias, muscle weakness, muscle tenderness and myalgias.  Skin: Negative for color change, pallor, rash, hair loss, nodules/bumps, redness, skin tightness, ulcers and sensitivity to sunlight.  Allergic/Immunologic: Negative for susceptible to infections.  Neurological: Negative for dizziness, light-headedness, numbness, headaches, memory loss and weakness.  Hematological: Negative for swollen glands.  Psychiatric/Behavioral: Negative for depressed mood, confusion and sleep disturbance. The patient is not nervous/anxious.     PMFS History:  Patient Active Problem List   Diagnosis Date Noted  . Special screening for malignant neoplasms, colon   . High risk medication use 01/13/2017  . History of iritis 01/13/2017  . Essential hypertension 01/13/2017  . Macular degeneration 01/13/2017  . 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 Leg weakness 07/08/2014  . Difficulty walking 07/08/2014  . Vaginal dryness   . 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 Raynaud disease   . Fibromyalgia   . Inflammatory arthritis   . Osteopenia     Past Medical History:  Diagnosis  Date  . Arthritis   . Fibromyalgia   . Macular degeneration    Left eye  . Osteopenia 05/2018   T score -2.3 wax 11% / 1.8% overall stable from prior DEXA  . Raynaud disease   . Reiters syndrome   . Sleep disturbance   . Vaginal dryness     Family History  Problem  Relation Age of Onset  . Hypertension Sister   . Heart disease Brother   . Hyperlipidemia Brother   . Alzheimer's disease Mother   . Osteoarthritis Mother   . High Cholesterol Sister   . Heart attack Brother   . Heart disease Brother   . Healthy Son   . Healthy Son    Past Surgical History:  Procedure Laterality Date  . BREAST SURGERY     LEFT BR LUMP-Benign  . COLONOSCOPY N/A 08/04/2018   Procedure: COLONOSCOPY;  Surgeon: Danie Binder, MD;  Location: [facility];  Service: Endoscopy;  Laterality: N/A;  8:30  . POLYPECTOMY  08/04/2018   Procedure: POLYPECTOMY;  Surgeon: Danie Binder, MD;  Location: [facility];  Service: Endoscopy;;   Social History   Social History Narrative  . Not on file   Immunization History  Administered Date(s) Administered  . Influenza,inj,Quad PF,6+ Mos 08/24/2012, 09/25/2013, 11/13/2014, 11/17/2017     Objective: Vital Signs: BP 131/84 (BP Location: Left Arm, Patient Position: Sitting, Cuff Size: Normal)   Pulse 74   Resp 12   Ht 5\' 4"  (1.626 m)   Wt 138 lb 12.8 oz (63 kg)   BMI 23.82 kg/m    Physical Exam Vitals signs and nursing note reviewed.  Constitutional:      Appearance: She is well-developed.  HENT:     Head: Normocephalic and atraumatic.  Eyes:     Conjunctiva/sclera: Conjunctivae normal.  Neck:     Musculoskeletal: Normal range of motion.  Cardiovascular:     Rate and Rhythm: Normal rate and regular rhythm.     Heart sounds: Normal heart sounds.  Pulmonary:     Effort: Pulmonary effort is normal.     Breath sounds: Normal breath sounds.  Abdominal:     General: Bowel sounds are normal.     Palpations: Abdomen is soft.  Lymphadenopathy:     Cervical: No cervical adenopathy.  Skin:    General: Skin is warm and dry.     Capillary Refill: Capillary refill takes less than 2 seconds.  Neurological:     Mental Status: She is alert and oriented to person, place, and time.  Psychiatric:        Behavior: Behavior  normal.      Musculoskeletal Exam: C-spine, thoracic spine, and lumbar spine good ROM.  No midline spinal tenderness.  Left SI joint tenderness.  Shoulder joints, elbow joints, wrist joints, MCPs, PIPs, and DIPs good ROM.  PIP and DIP synovial thickening consistent with osteoarthritis.  Hip joints, knee joints, ankle joints, MTPs, PIPs, and DIPs good ROM with no synovitis.  No warmth or effusion of knee joints.  No tenderness or swelling of ankle joints.  No tenderness over trochanteric bursa bilaterally.    CDAI Exam: CDAI Score: - Patient Global: -; Provider Global: - Swollen: 0 ; Tender: 1  Joint Exam      Right  Left  Sacroiliac      Tender     Investigation: No additional findings.  Imaging: No results found.  Recent Labs: Lab Results  Component Value Date  WBC 6.1 12/12/2018   HGB 12.9 12/12/2018   PLT 311 12/12/2018   NA 139 12/12/2018   K 4.0 12/12/2018   CL 104 12/12/2018   CO2 29 12/12/2018   GLUCOSE 92 12/12/2018   BUN 13 12/12/2018   CREATININE 1.00 (H) 12/12/2018   BILITOT 0.3 12/12/2018   ALKPHOS 92 01/03/2018   AST 22 12/12/2018   ALT 22 12/12/2018   PROT 6.5 12/12/2018   ALBUMIN 4.0 01/03/2018   CALCIUM 9.3 12/12/2018   GFRAA 70 12/12/2018   QFTBGOLDPLUS NEGATIVE 12/12/2018    Speciality Comments: TB gold neg 01/25/18 HIV, Hep, IG, SPEP neg 01/25/18  Procedures:  Sacroiliac Joint Inj on 03/27/2019 1:44 PM Indications: pain Details: 27 G 1.5 in needle, posterior approach Medications: 1 mL lidocaine 1 %; 40 mg triamcinolone acetonide 40 MG/ML Aspirate: 0 mL Outcome: tolerated well, no immediate complications Procedure, treatment alternatives, risks and benefits explained, specific risks discussed. Consent was given by the patient. Immediately prior to procedure a time out was called to verify the correct patient, procedure, equipment, support staff and site/side marked as required. Patient was prepped and draped in the usual sterile fashion.      Allergies: Patient has no known allergies.   Assessment / Plan:     Visit Diagnoses: Inflammatory arthritis -She has no synovitis on exam.  She presents today with left SI joint pain and left-sided sciatica.  She was started on a prednisone taper after her virtual visit on 02/12/2019.  The prednisone taper improved her symptoms for 2 to 3 days.  She tried taking Zanaflex 4 mg by mouth at bedtime with no relief, and she has been taking Tylenol at bedtime for pain relief.  She requested a left SI joint cortisone injection today.  She tolerated the procedure well.  She was advised to notify us if her symptoms persist.  She has intermittent pain in bilateral hands but no tenderness or synovitis was noted.  She has no Achilles tendinitis or plantar fasciitis at this time.  She will continue on Humira 40 mg subcutaneous injections every 14 days and sulfasalazine 500 mg 2 tablets by mouth twice daily.  She does not need any refills at this time.  We will update lab work today.  She was advised to notify us if she develops increased joint pain or joint swelling.  She will follow-up in the office in 5 months.  History of iritis: She has not had any recent iritis flares.  She has been experiencing increased eye dryness and has been having to use eyedrops more frequently.  No conjunctival injection was noted today.  High risk medication use - Humira 40 mg every 14 days and sulfasalazine 500 mg 2 tablets twice daily.  Last TB gold negative on 12/12/2018 and will monitor yearly.  Most recent CBC/CMP within normal limits except for borderline elevated creatinine on 12/12/2018.  Due for CBC/CMP today and will monitor every 3 months. Standing orders are in place.  - Plan: CBC with Differential/Platelet, COMPLETE METABOLIC PANEL WITH GFR  Chronic left SI joint pain -She presents today with left SI joint pain.  She is started on a prednisone taper on 02/12/2019 for her left SI joint pain and left-sided sciatica.  The prednisone  helped for 2 to 3 days and her pain returned.  She has been taking Tylenol as needed for pain relief at bedtime.  She tried taking Zanaflex 4 mg by mouth at bedtime with no relief.  She has been trying to  perform stretching exercises on a regular basis.  She is been having significant nocturnal pain.  She requested a left SI joint cortisone injection.  She tolerated procedure well.  The procedure note was completed above.  She was advised to notify us if her symptoms persist or worsen.  Raynaud's disease without gangrene -She has intermittent mild symptoms of Raynaud's.  She has no digital ulcerations or signs of gangrene.  Fibromyalgia -She has no muscle aches or muscle tenderness due to fibromyalgia at this time.  Spondylosis of lumbar region without myelopathy or radiculopathy -She has good range of motion.  No midline spinal tenderness  Osteopenia of multiple sites - She takes a vitamin D supplement.   Other medical conditions are listed as follows:  History of hypertension   History of macular degeneration   Essential hypertension   Orders: Orders Placed This Encounter  Procedures  . Sacroiliac Joint Inj  . CBC with Differential/Platelet  . COMPLETE METABOLIC PANEL WITH GFR   No orders of the defined types were placed in this encounter.   Face-to-face time spent with patient was 30 minutes. Greater than 50% of time was spent in counseling and coordination of care.  Follow-Up Instructions: Return in about 5 months (around 08/27/2019) for Inflammatory arthritis, iritis, Fibromyalgia.   [name], PA-C  I examined and evaluated the patient with [name] PA.  Overall patient is doing better on Humira.  She continues to have discomfort in the left SI joint.  She wanted to have cortisone injection.  Side effects were reviewed.  I injected her left SI joint as described above.  The plan of care was discussed as noted above.  Bo Merino, MD  Note - This record has been  created using Editor, commissioning.  Chart creation errors have been sought, but may not always  have been located. Such creation errors do not reflect on  the standard of medical care.

## 2019-03-27 ENCOUNTER — Encounter: Payer: Self-pay | Admitting: Physician Assistant

## 2019-03-27 ENCOUNTER — Ambulatory Visit: Payer: 59 | Admitting: Physician Assistant

## 2019-03-27 ENCOUNTER — Other Ambulatory Visit: Payer: Self-pay

## 2019-03-27 VITALS — BP 131/84 | HR 74 | Resp 12 | Ht 64.0 in | Wt 138.8 lb

## 2019-03-27 DIAGNOSIS — M1990 Unspecified osteoarthritis, unspecified site: Secondary | ICD-10-CM

## 2019-03-27 DIAGNOSIS — M47816 Spondylosis without myelopathy or radiculopathy, lumbar region: Secondary | ICD-10-CM

## 2019-03-27 DIAGNOSIS — M533 Sacrococcygeal disorders, not elsewhere classified: Secondary | ICD-10-CM | POA: Diagnosis not present

## 2019-03-27 DIAGNOSIS — Z8679 Personal history of other diseases of the circulatory system: Secondary | ICD-10-CM

## 2019-03-27 DIAGNOSIS — G8929 Other chronic pain: Secondary | ICD-10-CM

## 2019-03-27 DIAGNOSIS — Z79899 Other long term (current) drug therapy: Secondary | ICD-10-CM | POA: Diagnosis not present

## 2019-03-27 DIAGNOSIS — M797 Fibromyalgia: Secondary | ICD-10-CM

## 2019-03-27 DIAGNOSIS — I10 Essential (primary) hypertension: Secondary | ICD-10-CM

## 2019-03-27 DIAGNOSIS — M8589 Other specified disorders of bone density and structure, multiple sites: Secondary | ICD-10-CM

## 2019-03-27 DIAGNOSIS — Z8669 Personal history of other diseases of the nervous system and sense organs: Secondary | ICD-10-CM | POA: Diagnosis not present

## 2019-03-27 DIAGNOSIS — I7300 Raynaud's syndrome without gangrene: Secondary | ICD-10-CM

## 2019-03-27 MED ORDER — LIDOCAINE HCL 1 % IJ SOLN
1.0000 mL | INTRAMUSCULAR | Status: AC | PRN
  Administered 2019-03-27: 1 mL

## 2019-03-27 MED ORDER — TRIAMCINOLONE ACETONIDE 40 MG/ML IJ SUSP
40.0000 mg | INTRAMUSCULAR | Status: AC | PRN
  Administered 2019-03-27: 40 mg via INTRA_ARTICULAR

## 2019-03-28 LAB — CBC WITH DIFFERENTIAL/PLATELET
Absolute Monocytes: 561 cells/uL (ref 200–950)
Basophils Absolute: 31 cells/uL (ref 0–200)
Basophils Relative: 0.5 %
Eosinophils Absolute: 73 cells/uL (ref 15–500)
Eosinophils Relative: 1.2 %
HCT: 41.6 % (ref 35.0–45.0)
Hemoglobin: 13.7 g/dL (ref 11.7–15.5)
Lymphs Abs: 2129 cells/uL (ref 850–3900)
MCH: 30.2 pg (ref 27.0–33.0)
MCHC: 32.9 g/dL (ref 32.0–36.0)
MCV: 91.6 fL (ref 80.0–100.0)
MPV: 8.8 fL (ref 7.5–12.5)
Monocytes Relative: 9.2 %
Neutro Abs: 3306 cells/uL (ref 1500–7800)
Neutrophils Relative %: 54.2 %
Platelets: 339 10*3/uL (ref 140–400)
RBC: 4.54 10*6/uL (ref 3.80–5.10)
RDW: 12.6 % (ref 11.0–15.0)
Total Lymphocyte: 34.9 %
WBC: 6.1 10*3/uL (ref 3.8–10.8)

## 2019-03-28 LAB — COMPLETE METABOLIC PANEL WITH GFR
AG Ratio: 1.7 (calc) (ref 1.0–2.5)
ALT: 23 U/L (ref 6–29)
AST: 27 U/L (ref 10–35)
Albumin: 4.5 g/dL (ref 3.6–5.1)
Alkaline phosphatase (APISO): 77 U/L (ref 37–153)
BUN: 12 mg/dL (ref 7–25)
CO2: 27 mmol/L (ref 20–32)
Calcium: 10.2 mg/dL (ref 8.6–10.4)
Chloride: 102 mmol/L (ref 98–110)
Creat: 0.85 mg/dL (ref 0.50–0.99)
GFR, Est African American: 85 mL/min/{1.73_m2} (ref >=60)
GFR, Est Non African American: 73 mL/min/{1.73_m2} (ref >=60)
Globulin: 2.7 g/dL (calc) (ref 1.9–3.7)
Glucose, Bld: 93 mg/dL (ref 65–99)
Potassium: 4.3 mmol/L (ref 3.5–5.3)
Sodium: 139 mmol/L (ref 135–146)
Total Bilirubin: 0.3 mg/dL (ref 0.2–1.2)
Total Protein: 7.2 g/dL (ref 6.1–8.1)

## 2019-03-28 NOTE — Progress Notes (Signed)
CBC and CMP WNL

## 2019-04-02 ENCOUNTER — Other Ambulatory Visit: Payer: Self-pay | Admitting: Rheumatology

## 2019-04-02 NOTE — Telephone Encounter (Signed)
Last Visit: 03/27/19 Next visit: 08/28/19 Labs: 03/27/19 WNL  Okay to refill per Dr. Estanislado Pandy

## 2019-04-30 ENCOUNTER — Other Ambulatory Visit: Payer: Self-pay | Admitting: Rheumatology

## 2019-04-30 NOTE — Telephone Encounter (Signed)
Last Visit: 03/27/2019 Next Visit: 08/28/2019  Okay to refill per Dr. Estanislado Pandy.

## 2019-05-01 ENCOUNTER — Ambulatory Visit: Payer: 59 | Admitting: Gynecology

## 2019-05-01 ENCOUNTER — Other Ambulatory Visit: Payer: Self-pay

## 2019-05-01 ENCOUNTER — Encounter: Payer: Self-pay | Admitting: Gynecology

## 2019-05-01 VITALS — BP 122/76 | Ht 64.0 in | Wt 136.0 lb

## 2019-05-01 DIAGNOSIS — R350 Frequency of micturition: Secondary | ICD-10-CM | POA: Diagnosis not present

## 2019-05-01 DIAGNOSIS — N952 Postmenopausal atrophic vaginitis: Secondary | ICD-10-CM | POA: Diagnosis not present

## 2019-05-01 DIAGNOSIS — Z01419 Encounter for gynecological examination (general) (routine) without abnormal findings: Secondary | ICD-10-CM | POA: Diagnosis not present

## 2019-05-01 DIAGNOSIS — M8580 Other specified disorders of bone density and structure, unspecified site: Secondary | ICD-10-CM

## 2019-05-01 DIAGNOSIS — Z13220 Encounter for screening for lipoid disorders: Secondary | ICD-10-CM

## 2019-05-01 MED ORDER — ESTRADIOL 10 MCG VA TABS: ORAL_TABLET | VAGINAL | 4 refills | Status: DC

## 2019-05-01 NOTE — Progress Notes (Signed)
    Monica [name] June 29, 1956 [id number]        63 y.o.  [id number] for annual gynecologic exam.  Had been on Vagifem but discontinued because she was having trouble remembering.  Now having significant discomfort with intercourse and daily vaginal dryness.  Past medical history,surgical history, problem list, medications, allergies, family history and social history were all reviewed and documented as reviewed in the EPIC chart.  ROS:  Performed with pertinent positives and negatives included in the history, assessment and plan.   Additional significant findings : None   Exam: Monica [name] assistant Vitals:   05/01/19 1144  BP: 122/76  Weight: 136 lb (61.7 kg)  Height: 5\' 4"  (1.626 m)   Body mass index is 23.34 kg/m.  General appearance:  Normal affect, orientation and appearance. Skin: Grossly normal HEENT: Without gross lesions.  No cervical or supraclavicular adenopathy. Thyroid normal.  Lungs:  Clear without wheezing, rales or rhonchi Cardiac: RR, without RMG Abdominal:  Soft, nontender, without masses, guarding, rebound, organomegaly or hernia Breasts:  Examined lying and sitting without masses, retractions, discharge or axillary adenopathy. Pelvic:  Ext, BUS, Vagina: With atrophic changes  Cervix: With atrophic changes  Uterus: Anteverted, normal size, shape and contour, midline and mobile nontender   Adnexa: Without masses or tenderness    Anus and perineum: Normal   Rectovaginal: Normal sphincter tone without palpated masses or tenderness.    Assessment/Plan:  63 y.o. [id number] female for annual gynecologic exam.   1. Postmenopausal/atrophic genital changes.  Having daily dryness with dyspareunia.  Discussed options and she wants to go ahead and restart Vagifem.  We again discussed absorption with systemic effects to include thrombosis, endometrial stimulation and breast stimulation.  We will start with 10 mcg nightly x2 weeks then twice weekly.  Follow-up if any issues  or bleeding. 2. Osteopenia.  DEXA 05/2018 T score -2.3.  FRAX 11% / 1.8%.  Plan repeat DEXA next year at 2-year interval.  Check vitamin D level today with TSH. 3. Mammography 12/2018.  Continue with annual mammography when due.  Breast exam normal today. 4. Pap smear/HPV 2019.  No Pap smear done today.  No history of abnormal Pap smears.  Plan repeat Pap smear/HPV at 5-year interval per current screening guidelines. 5. Colonoscopy 2019.  Repeat at their recommended interval. 6. Health maintenance.  Has CBC and CMP at rheumatologist's office.  Check lipid profile vitamin D and TSH today.  We will also check urine analysis as she is having some urinary frequency although is drinking fluids.  Follow-up in 1 year, sooner as needed.   [name] MD, 12:20 PM 05/01/2019

## 2019-05-01 NOTE — Patient Instructions (Signed)
Start back on the Vagifem nightly for 2 weeks and then twice weekly.  Call if you continue to have issues with vaginal dryness or pain.  Follow-up in 1 year for annual exam

## 2019-05-02 ENCOUNTER — Encounter: Payer: Self-pay | Admitting: Gynecology

## 2019-05-02 LAB — LIPID PANEL
Cholesterol: 228 mg/dL — ABNORMAL HIGH (ref <200)
HDL: 58 mg/dL (ref >=50)
LDL Cholesterol (Calc): 147 mg/dL (calc) — ABNORMAL HIGH
Non-HDL Cholesterol (Calc): 170 mg/dL (calc) — ABNORMAL HIGH (ref <130)
Total CHOL/HDL Ratio: 3.9 (calc) (ref <5.0)
Triglycerides: 113 mg/dL (ref <150)

## 2019-05-02 LAB — TSH: TSH: 2.16 mIU/L (ref 0.40–4.50)

## 2019-05-02 LAB — VITAMIN D 25 HYDROXY (VIT D DEFICIENCY, FRACTURES): Vit D, 25-Hydroxy: 42 ng/mL (ref 30–100)

## 2019-05-03 LAB — URINALYSIS, COMPLETE W/RFL CULTURE
Bilirubin Urine: NEGATIVE
Glucose, UA: NEGATIVE
Hgb urine dipstick: NEGATIVE
Hyaline Cast: NONE SEEN /LPF
Ketones, ur: NEGATIVE
Nitrites, Initial: NEGATIVE
Protein, ur: NEGATIVE
RBC / HPF: NONE SEEN /HPF (ref 0–2)
Specific Gravity, Urine: 1.015 (ref 1.001–1.03)
pH: 7.5 (ref 5.0–8.0)

## 2019-05-03 LAB — URINE CULTURE
MICRO NUMBER:: 692561
SPECIMEN QUALITY:: ADEQUATE

## 2019-05-03 LAB — CULTURE INDICATED

## 2019-05-15 ENCOUNTER — Ambulatory Visit: Payer: Self-pay | Admitting: Physician Assistant

## 2019-06-01 ENCOUNTER — Other Ambulatory Visit: Payer: Self-pay | Admitting: Rheumatology

## 2019-06-01 NOTE — Telephone Encounter (Signed)
Last Visit: 03/27/2019 Next Visit: 08/28/2019  Okay to refill per Dr. Deveshwar.  

## 2019-07-03 ENCOUNTER — Other Ambulatory Visit: Payer: Self-pay | Admitting: Rheumatology

## 2019-07-03 NOTE — Telephone Encounter (Signed)
Last Visit: 03/27/2019 Next Visit: 08/28/2019 Labs: 03/27/19 WNL  Patient advised she is due to update labs and will update this week.  Okay to refill 30 day supply per  Dr. Estanislado Pandy

## 2019-07-10 ENCOUNTER — Ambulatory Visit: Payer: 59 | Admitting: Physician Assistant

## 2019-07-10 ENCOUNTER — Encounter: Payer: Self-pay | Admitting: Gynecology

## 2019-07-10 ENCOUNTER — Other Ambulatory Visit: Payer: Self-pay

## 2019-07-10 ENCOUNTER — Encounter: Payer: Self-pay | Admitting: Physician Assistant

## 2019-07-10 VITALS — BP 121/83 | HR 83 | Resp 12 | Ht 64.0 in | Wt 138.4 lb

## 2019-07-10 DIAGNOSIS — M797 Fibromyalgia: Secondary | ICD-10-CM

## 2019-07-10 DIAGNOSIS — G8929 Other chronic pain: Secondary | ICD-10-CM

## 2019-07-10 DIAGNOSIS — M47816 Spondylosis without myelopathy or radiculopathy, lumbar region: Secondary | ICD-10-CM

## 2019-07-10 DIAGNOSIS — Z8669 Personal history of other diseases of the nervous system and sense organs: Secondary | ICD-10-CM | POA: Diagnosis not present

## 2019-07-10 DIAGNOSIS — M1990 Unspecified osteoarthritis, unspecified site: Secondary | ICD-10-CM | POA: Diagnosis not present

## 2019-07-10 DIAGNOSIS — M533 Sacrococcygeal disorders, not elsewhere classified: Secondary | ICD-10-CM

## 2019-07-10 DIAGNOSIS — I10 Essential (primary) hypertension: Secondary | ICD-10-CM

## 2019-07-10 DIAGNOSIS — I7300 Raynaud's syndrome without gangrene: Secondary | ICD-10-CM

## 2019-07-10 DIAGNOSIS — M8589 Other specified disorders of bone density and structure, multiple sites: Secondary | ICD-10-CM

## 2019-07-10 DIAGNOSIS — Z79899 Other long term (current) drug therapy: Secondary | ICD-10-CM | POA: Diagnosis not present

## 2019-07-10 MED ORDER — FABB 2.2-25-1 MG PO TABS: 1.0000 | ORAL_TABLET | Freq: Every day | ORAL | 0 refills | Status: DC

## 2019-07-10 MED ORDER — DICLOFENAC SODIUM 1 % TD GEL: TRANSDERMAL | 4 refills | Status: DC

## 2019-07-10 MED ORDER — SULFASALAZINE 500 MG PO TBEC: DELAYED_RELEASE_TABLET | ORAL | 0 refills | Status: DC

## 2019-07-10 NOTE — Progress Notes (Signed)
Office Visit Note  Patient: Monica [name]             Date of Birth: 10-Jan-1956           MRN: [id number]             PCP: Lemmie Evens, MD Referring: Lemmie Evens, MD Visit Date: 07/10/2019 Occupation: @GUAROCC @  Subjective:  Lower back pain    History of Present Illness: Monica [name] is a 63 y.o. female with history of inflammatory arthritis and fibromyalgia.  She is on Humira 40 mg sq injections every 14 days and sulfasalazine 500 mg 2 tablets twice daily.  She denies any recent flares.  She states bilateral wrist joint pain and inflammation has resolved. She presents today with left SI joint pain.  She states the pain has been severe and constant for the past 2 days.  She experienced severe pain last night since she has difficulty laying flat.  She has been experiencing muscle cramps, a burning sensation, and weakness in the left lower extremity.     Activities of Daily Living:  Patient reports morning stiffness for  few hours.   Patient Reports nocturnal pain.  Difficulty dressing/grooming: Denies Difficulty climbing stairs: Denies Difficulty getting out of chair: Reports Difficulty using hands for taps, buttons, cutlery, and/or writing: Denies  Review of Systems  Constitutional: Positive for fatigue.  HENT: Negative for mouth sores, mouth dryness and nose dryness.   Eyes: Positive for dryness. Negative for pain and visual disturbance.  Respiratory: Negative for cough, hemoptysis, shortness of breath and difficulty breathing.   Cardiovascular: Negative for chest pain, palpitations, hypertension and swelling in legs/feet.  Gastrointestinal: Negative for blood in stool, constipation and diarrhea.  Endocrine: Negative for increased urination.  Genitourinary: Negative for painful urination.  Musculoskeletal: Positive for arthralgias, joint pain and morning stiffness. Negative for joint swelling, myalgias, muscle weakness, muscle tenderness and myalgias.  Skin:  Negative for color change, pallor, rash, hair loss, nodules/bumps, skin tightness, ulcers and sensitivity to sunlight.  Allergic/Immunologic: Negative for susceptible to infections.  Neurological: Negative for dizziness, numbness, headaches and weakness.  Hematological: Negative for swollen glands.  Psychiatric/Behavioral: Positive for sleep disturbance. Negative for depressed mood. The patient is not nervous/anxious.     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[place])    Raynaud disease    Fibromyalgia    Inflammatory arthritis    Osteopenia     Past Medical History:  Diagnosis Date   Arthritis    Fibromyalgia    Macular degeneration    Left eye   Osteopenia 05/2018   T score -2.3 FRAX 11% / 1.8% overall stable from prior DEXA   Raynaud disease    Reiters syndrome    Sleep disturbance    Vaginal dryness     Family History  Problem Relation Age of Onset   Hypertension Sister    Heart disease Brother    Hyperlipidemia Brother    Heart attack Brother    Alzheimer's disease Mother    Osteoarthritis Mother    High Cholesterol Sister    Heart attack Brother  Heart disease Brother    Healthy Son    Healthy Son    Past Surgical History:  Procedure Laterality Date   BREAST SURGERY     LEFT BR LUMP-Benign   COLONOSCOPY N/A 08/04/2018   Procedure: COLONOSCOPY;  Surgeon: Danie Binder, MD;  Location: [facility];  Service: Endoscopy;  Laterality: N/A;  8:30   POLYPECTOMY  08/04/2018   Procedure: POLYPECTOMY;  Surgeon: Danie Binder, MD;   Location: [facility];  Service: Endoscopy;;   Social History   Social History Narrative   Not on file   Immunization History  Administered Date(s) Administered   Influenza,inj,Quad PF,6+ Mos 08/24/2012, 09/25/2013, 11/13/2014, 11/17/2017     Objective: Vital Signs: BP 121/83 (BP Location: Left Arm, Patient Position: Sitting, Cuff Size: Normal)    Pulse 83    Resp 12    Ht 5\' 4"  (1.626 m)    Wt 138 lb 6.4 oz (62.8 kg)    BMI 23.76 kg/m    Physical Exam Vitals signs and nursing note reviewed.  Constitutional:      Appearance: She is well-developed.  HENT:     Head: Normocephalic and atraumatic.  Eyes:     Conjunctiva/sclera: Conjunctivae normal.  Neck:     Musculoskeletal: Normal range of motion.  Cardiovascular:     Rate and Rhythm: Normal rate and regular rhythm.     Heart sounds: Normal heart sounds.  Pulmonary:     Effort: Pulmonary effort is normal.     Breath sounds: Normal breath sounds.  Abdominal:     General: Bowel sounds are normal.     Palpations: Abdomen is soft.  Lymphadenopathy:     Cervical: No cervical adenopathy.  Skin:    General: Skin is warm and dry.     Capillary Refill: Capillary refill takes less than 2 seconds.  Neurological:     Mental Status: She is alert and oriented to person, place, and time.  Psychiatric:        Behavior: Behavior normal.      Musculoskeletal Exam: C-spine, thoracic spine, and lumbar spine good ROM.  No midline spinal tenderness.  Left SI joint tenderness.  Negative straight leg raise.  Shoulder joints, elbow joints, wrist joints, MCPs, PIPs, and DIPs good ROM with no synovitis.  Complete fist formation bilaterally.  Hip joints, knee joints, ankle joints, MTPs, PIPs, and DIPs good ROM with no synovitis.  No warmth or effusion of knee joints.  No tenderness or swelling of ankle joints.   CDAI Exam: CDAI Score: -- Patient Global: --; Provider Global: -- Swollen: 0 ; Tender: 1  Joint Exam      Right  Left   Sacroiliac      Tender     Investigation: No additional findings.  Imaging: No results found.  Recent Labs: Lab Results  Component Value Date   WBC 6.1 03/27/2019   HGB 13.7 03/27/2019   PLT 339 03/27/2019   NA 139 03/27/2019   K 4.3 03/27/2019   CL 102 03/27/2019   CO2 27 03/27/2019   GLUCOSE 93 03/27/2019   BUN 12 03/27/2019   CREATININE 0.85 03/27/2019   BILITOT 0.3 03/27/2019   ALKPHOS 92 01/03/2018   AST 27 03/27/2019   ALT 23 03/27/2019   PROT 7.2 03/27/2019   ALBUMIN 4.0 01/03/2018   CALCIUM 10.2 03/27/2019   GFRAA 85 03/27/2019   QFTBGOLDPLUS NEGATIVE 12/12/2018    Speciality Comments: TB gold neg 01/25/18 HIV, Hep, IG, SPEP neg  01/25/18  Procedures:  Sacroiliac Joint Inj on 07/10/2019 10:09 AM Indications: pain Details: 27 G 1.5 in needle, posterior approach Medications: 1 mL lidocaine 1 %; 40 mg triamcinolone acetonide 40 MG/ML Aspirate: 0 mL Outcome: tolerated well, no immediate complications Procedure, treatment alternatives, risks and benefits explained, specific risks discussed. Consent was given by the patient. Immediately prior to procedure a time out was called to verify the correct patient, procedure, equipment, support staff and site/side marked as required. Patient was prepped and draped in the usual sterile fashion.     Allergies: Patient has no known allergies.   Assessment / Plan:     Visit Diagnoses: Inflammatory arthritis: She presents today with left SI joint pain, which started 2 days ago.  She is experiencing left sided sciatica, and requested a left SI joint cortisone injection.  She has no other joint pain or joint swelling at this time.  No synovitis noted on exam.  The bilateral wrist pain and synovitis has resolved.  She is clinically doing well on Humira 40 mg subcutaneous injections every 14 days and sulfasalazine 500 mg 2 tablets twice daily.  She will continue on this current treatment regimen.  She was advised to notify us if she  develops increased joint pain or joint swelling.  She will follow-up in the office in 2 months.  History of iritis: She has not had any recent iritis flares.    High risk medication use -Humira 40 mg every 14 days and sulfasalazine 500 mg 2 tablets twice daily. Last TB gold negative on 12/12/2018 and will monitor yearly.  Most recent CBC/CMP within normal limits on 03/27/2019.  Due for CBC/CMP today and will monitor every 3 months. - Plan: CBC with Differential/Platelet, COMPLETE METABOLIC PANEL WITH GFR  Chronic left SI joint pain -She had a left SI joint cortisone injection on 03/27/19.  X-rays of the pelvis revealed sclerosis on 01/25/18.  She has been experiencing severe left SI joint pain for the past 2 days.  She experiences nocturnal pain and left sided sciatica.  She has been experiencing a burning sensation and lower extremity weakness in the left lower extremity.  She was advised to notify us if her symptoms persist or worsen.  She was given a handout of back exercises and several back exercises were demonstrated today in the office.  She would benefit from physical therapy in the future but she declined at this time.  Raynaud's disease without gangrene: Not active recently. No digital ulcerations or signs of gangrene noted.   Fibromyalgia: Her fibromyalgia pain has been manageable recently.  She has no generalized muscle aches muscle tenderness at this time.  She has been experiencing chronic fatigue related to insomnia.  She has been experiencing nocturnal pain due to increased lower back pain.  She was encouraged to stay active and exercise on a regular basis.  She was given a handout back exercises and several back exercises were demonstrated today in the office.  She would benefit from physical therapy but does not want to proceed with a referral at this time.  Spondylosis of lumbar region without myelopathy or radiculopathy: She has chronic lower back pain. She has been experiencing symptoms  of left-sided sciatica for the last 2 days.  She has also been experiencing some left lower extremity weakness.  She requested a left SI joint cortisone injection today.  She was advised to notify us of her symptoms of sciatica persist, and we will order x-rays and proceed with a MRI  of the lumbar spine.   Osteopenia of multiple sites: DEXA 05/19/18: Left femoral neck BMD 0.596 T-score -2.3. She takes vitamin D 2,000 units by mouth daily.   Other medical conditions are listed as follows:   Essential hypertension  History of macular degeneration  Orders: Orders Placed This Encounter  Procedures   Sacroiliac Joint Inj   CBC with Differential/Platelet   COMPLETE METABOLIC PANEL WITH GFR   Meds ordered this encounter  Medications   sulfaSALAzine (AZULFIDINE) 500 MG EC tablet    Sig: Take 2 tablets by mouth twice daily.    Dispense:  120 tablet    Refill:  0   Folic Acid-Vit Q000111Q 123456 (FABB) 2.2-25-1 MG TABS    Sig: Take 1 tablet by mouth daily.    Dispense:  30 tablet    Refill:  0   diclofenac sodium (VOLTAREN) 1 % GEL    Sig: Apply 2 grams to 4 grams to affected area up to 4 times daily as needed    Dispense:  400 g    Refill:  4    Face-to-face time spent with patient was 30 minutes. Greater than 50% of time was spent in counseling and coordination of care.  Follow-Up Instructions: Return for Inflammatory arthritis , Fibromyalgia.   Ofilia Neas, PA-C  Note - This record has been created using Dragon software.  Chart creation errors have been sought, but may not always  have been located. Such creation errors do not reflect on  the standard of medical care.

## 2019-07-10 NOTE — Patient Instructions (Addendum)
Standing Labs We placed an order today for your standing lab work.    Please come back and get your standing labs in December and every 3 months   We have open lab daily Monday through Thursday from 8:30-12:30 PM and 1:30-4:30 PM and Friday from 8:30-12:30 PM and 1:30 -4:00 PM at the office of Dr. Bo Merino.   You may experience shorter wait times on Monday and Friday afternoons. The office is located at [address], [place], [place], [place] [address] No appointment is necessary.   Labs are drawn by [facility].  You may receive a bill from [facility] for your lab work.  If you wish to have your labs drawn at another location, please call the office 24 hours in advance to send orders.  If you have any questions regarding directions or hours of operation,  please call [phone].   Just as a reminder please drink plenty of water prior to coming for your lab work. Thanks!   Sacroiliac Joint Dysfunction  Sacroiliac joint dysfunction is a condition that causes inflammation on one or both sides of the sacroiliac (SI) joint. The SI joint connects the lower part of the spine (sacrum) with the two upper portions of the pelvis (ilium). This condition causes deep aching or burning pain in the low back. In some cases, the pain may also spread into one or both buttocks, hips, or thighs. What are the causes? This condition may be caused by:  Pregnancy. During pregnancy, extra stress is put on the SI joints because the pelvis widens.  Injury, such as: ? Injuries from car accidents. ? Sports-related injuries. ? Work-related injuries.  Having one leg that is shorter than the other.  Conditions that affect the joints, such as: ? Rheumatoid arthritis. ? Gout. ? Psoriatic arthritis. ? Joint infection (septic arthritis). Sometimes, the cause of SI joint dysfunction is not known. What are the signs or symptoms? Symptoms of this condition include:  Aching or burning pain in the lower  back. The pain may also spread to other areas, such as: ? Buttocks. ? Groin. ? Thighs.  Muscle spasms in or around the painful areas.  Increased pain when standing, walking, running, stair climbing, bending, or lifting. How is this diagnosed? This condition is diagnosed with a physical exam and medical history. During the exam, the health care provider may move one or both of your legs to different positions to check for pain. Various tests may be done to confirm the diagnosis, including:  Imaging tests to look for other causes of pain. These may include: ? MRI. ? CT scan. ? Bone scan.  Diagnostic injection. A numbing medicine is injected into the SI joint using a needle. If your pain is temporarily improved or stopped after the injection, this can indicate that SI joint dysfunction is the problem. How is this treated? Treatment depends on the cause and severity of your condition. Treatment options may include:  Ice or heat applied to the lower back area after an injury. This may help reduce pain and muscle spasms.  Medicines to relieve pain or inflammation or to relax the muscles.  Wearing a back brace (sacroiliac brace) to help support the joint while your back is healing.  Physical therapy to increase muscle strength around the joint and flexibility at the joint. This may also involve learning proper body positions and ways of moving to relieve stress on the joint.  Direct manipulation of the SI joint.  Injections of steroid medicine into the  joint to reduce pain and swelling.  Radiofrequency ablation to burn away nerves that are carrying pain messages from the joint.  Use of a device that provides electrical stimulation to help reduce pain at the joint.  Surgery to put in screws and plates that limit or prevent joint motion. This is rare. Follow these instructions at home: Medicines  Take over-the-counter and prescription medicines only as told by your health care  provider.  Do not drive or use heavy machinery while taking prescription pain medicine.  If you are taking prescription pain medicine, take actions to prevent or treat constipation. Your health care provider may recommend that you: ? Drink enough fluid to keep your urine pale yellow. ? Eat foods that are high in fiber, such as fresh fruits and vegetables, whole grains, and beans. ? Limit foods that are high in fat and processed sugars, such as fried or sweet foods. ? Take an over-the-counter or prescription medicine for constipation. If you have a brace:  Wear the brace as told by your health care provider. Remove it only as told by your health care provider.  Keep the brace clean.  If the brace is not waterproof: ? Do not let it get wet. ? Cover it with a watertight covering when you take a bath or a shower. Managing pain, stiffness, and swelling      Icing can help with pain and swelling. Heat may help with muscle tension or spasms. Ask your health care provider if you should use ice or heat.  If directed, put ice on the affected area: ? If you have a removable brace, remove it as told by your health care provider. ? Put ice in a plastic bag. ? Place a towel between your skin and the bag. ? Leave the ice on for 20 minutes, 2-3 times a day.  If directed, apply heat to the affected area. Use the heat source that your health care provider recommends, such as a moist heat pack or a heating pad. ? Place a towel between your skin and the heat source. ? Leave the heat on for 20-30 minutes. ? Remove the heat if your skin turns bright red. This is especially important if you are unable to feel pain, heat, or cold. You may have a greater risk of getting burned. General instructions  Rest as needed. Ask your health care provider what activities are safe for you.  Return to your normal activities as told by your health care provider.  Exercise as directed by your health care provider  or physical therapist.  Do not use any products that contain nicotine or tobacco, such as cigarettes and e-cigarettes. These can delay bone healing. If you need help quitting, ask your health care provider.  Keep all follow-up visits as told by your health care provider. This is important. Contact a health care provider if:  Your pain is not controlled with medicine.  You have a fever.  Your pain is getting worse. Get help right away if:  You have weakness, numbness, or tingling in your legs or feet.  You lose control of your bladder or bowel. Summary  Sacroiliac joint dysfunction is a condition that causes inflammation on one or both sides of the sacroiliac (SI) joint.  This condition causes deep aching or burning pain in the low back. In some cases, the pain may also spread into one or both buttocks, hips, or thighs.  Treatment depends on the cause and severity of your condition. It may  include medicines to reduce pain and swelling or to relax muscles. This information is not intended to replace advice given to you by your health care provider. Make sure you discuss any questions you have with your health care provider. Document Released: 12/24/2008 Document Revised: 05/24/2018 Document Reviewed: 11/07/2017 Elsevier Patient Education  2020 [employer].   Back Exercises These exercises help to make your trunk and back strong. They also help to keep the lower back flexible. Doing these exercises can help to prevent back pain or lessen existing pain.  If you have back pain, try to do these exercises 2-3 times each day or as told by your doctor.  As you get better, do the exercises once each day. Repeat the exercises more often as told by your doctor.  To stop back pain from coming back, do the exercises once each day, or as told by your doctor. Exercises Single knee to chest Do these steps 3-5 times in a row for each leg: 1. Lie on your back on a firm bed or the floor with  your legs stretched out. 2. Bring one knee to your chest. 3. Grab your knee or thigh with both hands and hold them it in place. 4. Pull on your knee until you feel a gentle stretch in your lower back or buttocks. 5. Keep doing the stretch for 10-30 seconds. 6. Slowly let go of your leg and straighten it. Pelvic tilt Do these steps 5-10 times in a row: 1. Lie on your back on a firm bed or the floor with your legs stretched out. 2. Bend your knees so they point up to the ceiling. Your feet should be flat on the floor. 3. Tighten your lower belly (abdomen) muscles to press your lower back against the floor. This will make your tailbone point up to the ceiling instead of pointing down to your feet or the floor. 4. Stay in this position for 5-10 seconds while you gently tighten your muscles and breathe evenly. Cat-cow Do these steps until your lower back bends more easily: 1. Get on your hands and knees on a firm surface. Keep your hands under your shoulders, and keep your knees under your hips. You may put padding under your knees. 2. Let your head hang down toward your chest. Tighten (contract) the muscles in your belly. Point your tailbone toward the floor so your lower back becomes rounded like the back of a cat. 3. Stay in this position for 5 seconds. 4. Slowly lift your head. Let the muscles of your belly relax. Point your tailbone up toward the ceiling so your back forms a sagging arch like the back of a cow. 5. Stay in this position for 5 seconds.  Press-ups Do these steps 5-10 times in a row: 1. Lie on your belly (face-down) on the floor. 2. Place your hands near your head, about shoulder-width apart. 3. While you keep your back relaxed and keep your hips on the floor, slowly straighten your arms to raise the top half of your body and lift your shoulders. Do not use your back muscles. You may change where you place your hands in order to make yourself more comfortable. 4. Stay in this  position for 5 seconds. 5. Slowly return to lying flat on the floor.  Bridges Do these steps 10 times in a row: 1. Lie on your back on a firm surface. 2. Bend your knees so they point up to the ceiling. Your feet should be flat  on the floor. Your arms should be flat at your sides, next to your body. 3. Tighten your butt muscles and lift your butt off the floor until your waist is almost as high as your knees. If you do not feel the muscles working in your butt and the back of your thighs, slide your feet 1-2 inches farther away from your butt. 4. Stay in this position for 3-5 seconds. 5. Slowly lower your butt to the floor, and let your butt muscles relax. If this exercise is too easy, try doing it with your arms crossed over your chest. Belly crunches Do these steps 5-10 times in a row: 1. Lie on your back on a firm bed or the floor with your legs stretched out. 2. Bend your knees so they point up to the ceiling. Your feet should be flat on the floor. 3. Cross your arms over your chest. 4. Tip your chin a little bit toward your chest but do not bend your neck. 5. Tighten your belly muscles and slowly raise your chest just enough to lift your shoulder blades a tiny bit off of the floor. Avoid raising your body higher than that, because it can put too much stress on your low back. 6. Slowly lower your chest and your head to the floor. Back lifts Do these steps 5-10 times in a row: 1. Lie on your belly (face-down) with your arms at your sides, and rest your forehead on the floor. 2. Tighten the muscles in your legs and your butt. 3. Slowly lift your chest off of the floor while you keep your hips on the floor. Keep the back of your head in line with the curve in your back. Look at the floor while you do this. 4. Stay in this position for 3-5 seconds. 5. Slowly lower your chest and your face to the floor. Contact a doctor if:  Your back pain gets a lot worse when you do an exercise.  Your  back pain does not get better 2 hours after you exercise. If you have any of these problems, stop doing the exercises. Do not do them again unless your doctor says it is okay. Get help right away if:  You have sudden, very bad back pain. If this happens, stop doing the exercises. Do not do them again unless your doctor says it is okay. This information is not intended to replace advice given to you by your health care provider. Make sure you discuss any questions you have with your health care provider. Document Released: 10/30/2010 Document Revised: 06/22/2018 Document Reviewed: 06/22/2018 Elsevier Patient Education  2020 [employer].

## 2019-07-11 ENCOUNTER — Ambulatory Visit: Payer: 59 | Admitting: Rheumatology

## 2019-07-11 LAB — CBC WITH DIFFERENTIAL/PLATELET
Absolute Monocytes: 494 cells/uL (ref 200–950)
Basophils Absolute: 29 cells/uL (ref 0–200)
Basophils Relative: 0.6 %
Eosinophils Absolute: 139 cells/uL (ref 15–500)
Eosinophils Relative: 2.9 %
HCT: 42.2 % (ref 35.0–45.0)
Hemoglobin: 13.7 g/dL (ref 11.7–15.5)
Lymphs Abs: 1598 cells/uL (ref 850–3900)
MCH: 30.8 pg (ref 27.0–33.0)
MCHC: 32.5 g/dL (ref 32.0–36.0)
MCV: 94.8 fL (ref 80.0–100.0)
MPV: 8.8 fL (ref 7.5–12.5)
Monocytes Relative: 10.3 %
Neutro Abs: 2539 cells/uL (ref 1500–7800)
Neutrophils Relative %: 52.9 %
Platelets: 327 10*3/uL (ref 140–400)
RBC: 4.45 10*6/uL (ref 3.80–5.10)
RDW: 12.1 % (ref 11.0–15.0)
Total Lymphocyte: 33.3 %
WBC: 4.8 10*3/uL (ref 3.8–10.8)

## 2019-07-11 LAB — COMPLETE METABOLIC PANEL WITH GFR
AG Ratio: 1.6 (calc) (ref 1.0–2.5)
ALT: 17 U/L (ref 6–29)
AST: 20 U/L (ref 10–35)
Albumin: 4.2 g/dL (ref 3.6–5.1)
Alkaline phosphatase (APISO): 69 U/L (ref 37–153)
BUN/Creatinine Ratio: 15 (calc) (ref 6–22)
BUN: 15 mg/dL (ref 7–25)
CO2: 27 mmol/L (ref 20–32)
Calcium: 9.5 mg/dL (ref 8.6–10.4)
Chloride: 103 mmol/L (ref 98–110)
Creat: 1.03 mg/dL — ABNORMAL HIGH (ref 0.50–0.99)
GFR, Est African American: 67 mL/min/{1.73_m2} (ref >=60)
GFR, Est Non African American: 58 mL/min/{1.73_m2} — ABNORMAL LOW (ref >=60)
Globulin: 2.6 g/dL (calc) (ref 1.9–3.7)
Glucose, Bld: 93 mg/dL (ref 65–99)
Potassium: 4.5 mmol/L (ref 3.5–5.3)
Sodium: 139 mmol/L (ref 135–146)
Total Bilirubin: 0.4 mg/dL (ref 0.2–1.2)
Total Protein: 6.8 g/dL (ref 6.1–8.1)

## 2019-07-11 NOTE — Progress Notes (Signed)
CBC WNL.  Creatinine is borderline elevated. GFR is 58.  Please notify patient and advise her to avoid taking NSAIDs.  Rest of CMP WNL

## 2019-08-09 ENCOUNTER — Other Ambulatory Visit: Payer: Self-pay | Admitting: Rheumatology

## 2019-08-09 NOTE — Telephone Encounter (Signed)
Last Visit: 07/10/19 Next Visit: 08/28/19 Labs: 07/10/19 CBC WNL. Creatinine is borderline elevated. GFR is 58. Rest of CMP WNL TB Gold: 12/12/18 Neg   Okay to refill per Dr. Estanislado Pandy

## 2019-08-14 NOTE — Progress Notes (Deleted)
Office Visit Note  Patient: Monica [name]             Date of Birth: Jan 12, 1956           MRN: [id number]             PCP: Lemmie Evens, [name] Referring: Lemmie Evens, [name] Visit Date: 08/28/2019 Occupation: @GUAROCC @  Subjective:  No chief complaint on file.   History of Present Illness: Monica [name] is a 63 y.o. female ***   Activities of Daily Living:  Patient reports morning stiffness for *** {minute/hour:19697}.   Patient {ACTIONS;DENIES/REPORTS:21021675::"Denies"} nocturnal pain.  Difficulty dressing/grooming: {ACTIONS;DENIES/REPORTS:21021675::"Denies"} Difficulty climbing stairs: {ACTIONS;DENIES/REPORTS:21021675::"Denies"} Difficulty getting out of chair: {ACTIONS;DENIES/REPORTS:21021675::"Denies"} Difficulty using hands for taps, buttons, cutlery, and/or writing: {ACTIONS;DENIES/REPORTS:21021675::"Denies"}  No Rheumatology ROS completed.   PMFS History:  Patient Active Problem List   Diagnosis Date Noted  . Special screening for malignant neoplasms, colon   . High risk medication use 01/13/2017  . History of iritis 01/13/2017  . Essential hypertension 01/13/2017  . Macular degeneration 01/13/2017  . 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 Leg weakness 07/08/2014  . Difficulty walking 07/08/2014  . Vaginal dryness   . 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 Raynaud disease   . Fibromyalgia   . Inflammatory arthritis   . Osteopenia     Past Medical History:  Diagnosis Date  . Arthritis   . Fibromyalgia   . Macular degeneration    Left eye  . Osteopenia 05/2018   T score -2.3 FRAX 11% / 1.8% overall stable from prior DEXA  . Raynaud disease   . Reiters syndrome   . Sleep disturbance   . Vaginal dryness     Family History  Problem Relation Age of Onset  . Hypertension Sister   . Heart disease Brother   . Hyperlipidemia Brother   . Heart attack Brother   . Alzheimer's disease Mother   . Osteoarthritis Mother   .  High Cholesterol Sister   . Heart attack Brother   . Heart disease Brother   . Healthy Son   . Healthy Son    Past Surgical History:  Procedure Laterality Date  . BREAST SURGERY     LEFT BR LUMP-Benign  . COLONOSCOPY N/A 08/04/2018   Procedure: COLONOSCOPY;  Surgeon: Monica [name];  [facility];  Service: Endoscopy;  Laterality: N/A;  8:30  . POLYPECTOMY  08/04/2018   Procedure: POLYPECTOMY;  Surgeon: Monica [name];  Location: [facility];  Service: Endoscopy;;   Social History   Social History Narrative  . Not on file   Immunization History  Administered Date(s) Administered  . Influenza,inj,Quad PF,6+ Mos 08/24/2012, 09/25/2013, 11/13/2014, 11/17/2017     Objective: Vital Signs: There were no vitals taken for this visit.   Physical Exam   Musculoskeletal Exam: ***  CDAI Exam: CDAI Score: - Patient Global: -; Provider Global: - Swollen: -; Tender: - Joint Exam   No joint exam has been documented for this visit   There is currently no information documented on the homunculus. Go to the Rheumatology activity and complete the homunculus joint exam.  Investigation: No additional findings.  Imaging: No results found.  Recent Labs: Lab Results  Component Value Date   WBC 4.8 07/10/2019   HGB 13.7 07/10/2019   PLT 327 07/10/2019   NA 139 07/10/2019   K 4.5 07/10/2019   CL 103 07/10/2019   CO2 27 07/10/2019  GLUCOSE 93 07/10/2019   BUN 15 07/10/2019   CREATININE 1.03 (H) 07/10/2019   BILITOT 0.4 07/10/2019   ALKPHOS 92 01/03/2018   AST 20 07/10/2019   ALT 17 07/10/2019   PROT 6.8 07/10/2019   ALBUMIN 4.0 01/03/2018   CALCIUM 9.5 07/10/2019   GFRAA 67 07/10/2019   QFTBGOLDPLUS NEGATIVE 12/12/2018    Speciality Comments: TB gold neg 01/25/18 HIV, Hep, IG, SPEP neg 01/25/18  Procedures:  No procedures performed Allergies: Patient has no known allergies.   Assessment / Plan:     Visit Diagnoses: No diagnosis found.   Orders: No orders of the defined types were placed in this encounter.  No orders of the defined types were placed in this encounter.   Face-to-face time spent with patient was *** minutes. Greater than 50% of time was spent in counseling and coordination of care.  Follow-Up Instructions: No follow-ups on file.   Monica [name]  Note - This record has been created using Dragon software.  Chart creation errors have been sought, but may not always  have been located. Such creation errors do not reflect on  the standard of medical care.

## 2019-08-28 ENCOUNTER — Ambulatory Visit: Payer: 59 | Admitting: Physician Assistant

## 2019-09-03 ENCOUNTER — Other Ambulatory Visit: Payer: Self-pay | Admitting: Physician Assistant

## 2019-09-03 NOTE — Telephone Encounter (Signed)
Last Visit: 07/10/2019 Next Visit: 09/26/2019 Labs: 07/10/2019 CBC WNL. Creatinine is borderline elevated. GFR is 58. Rest of CMP WNL  Okay to refill per Dr. Estanislado Pandy.

## 2019-09-25 NOTE — Progress Notes (Signed)
Virtual Visit via Telephone Note  I connected with Monica [name] on 09/26/19 at  1:00 PM EST by telephone and verified that I am speaking with the correct person using two identifiers.  Location: Patient: Home  Provider: Clinic This service was conducted via virtual visit.   The patient was located at home. I was located in my office.  Consent was obtained prior to the virtual visit and is aware of possible charges through their insurance for this visit.  The patient is an established patient.  Dr. Estanislado Pandy, MD conducted the virtual visit and [name], PA-C acted as scribe during the service.  Office staff helped with scheduling follow up visits after the service was conducted.     I discussed the limitations, risks, security and privacy concerns of performing an evaluation and management service by telephone and the availability of in person appointments. I also discussed with the patient that there may be a patient responsible charge related to this service. The patient expressed understanding and agreed to proceed.  CC: Left SI joint pain History of Present Illness: Patient is a 63 year old female with a past medical history of inflammatory arthritis.  She is on Humira 40 mg sq injections every 14 days and sulfasalazine 500 mg 2 tablets by mouth twice daily.  She continues to have significant SI joint pain and lower back stiffness.  She has been using a heating pad in the evenings and mornings.  She is not having any increased joint pain or joint swelling.  She denies any recent iritis flares.   Review of Systems  Constitutional: Negative for fever and malaise/fatigue.  Eyes: Negative for photophobia, pain, discharge and redness.  Respiratory: Negative for cough, shortness of breath and wheezing.   Cardiovascular: Negative for chest pain and palpitations.  Gastrointestinal: Negative for blood in stool, constipation and diarrhea.  Genitourinary: Negative for dysuria.  Musculoskeletal:  Positive for back pain and joint pain. Negative for myalgias and neck pain.  Skin: Negative for rash.  Neurological: Negative for dizziness and headaches.  Psychiatric/Behavioral: Negative for depression. The patient is not nervous/anxious and does not have insomnia.       Observations/Objective: Physical Exam  Constitutional: She is oriented to person, place, and time.  Neurological: She is alert and oriented to person, place, and time.  Psychiatric: Mood, memory, affect and judgment normal.   Patient reports morning stiffness for  30-60 minutes.   Patient reports nocturnal pain.  Difficulty dressing/grooming: Denies Difficulty climbing stairs: Reports Difficulty getting out of chair: Reports Difficulty using hands for taps, buttons, cutlery, and/or writing: Denies   Assessment and Plan: Visit Diagnoses: Inflammatory arthritis: She has persistent left SI joint discomfort despite having a cortisone injection on 07/10/19, performing stretching exercises daily, and using a heating pad. We will proceed with scheduling a MRI of the left SI joint once she is ready. She has no other joint pain or joint inflammation at this time.  She is overall clinically doing well on Humira 40 mg sq injections every 14 days and sulfasalazine 500 mg 2 tablets BID.  She will continue on the current treatment regimen.  She was advised to notify us if she develops increased joint pain or joint swelling.  She will follow up in 3 months.   History of iritis: She has not had any recent iritis flares.    High risk medication use -Humira 40 mg every 14 days and sulfasalazine 500 mg 2 tablets twice daily. Last TB gold negative  on 12/12/2018 and will monitor yearly. CBC and CMP were drawn on 07/10/19.   She is due to update CBC and CMP this month.  Standing orders are in place.  Chronic left SI joint pain -She has ongoing left SI joint pain.  She experiences severe pain at times.  She has nocturnal pain and uses a  heating pad daily.  She has been performing stretching exercises every morning, which has been helping. Core strengthening exercises were discussed.  She previously went to physical therapy which was also helpful, but she would like to hold off on a referral at this time due to pandemic. She had a MRI of the lumbar spine on 10/26/16 which revealed L5-S1 broad based disc bulge with a small left foraminal/llateral disc protrusion abutting the left extraforaminal L5 nerve root, which may be contributing to her symptoms. She would like to proceed with a MRI of the SI joints to further evaluate.  She will notify us when she would like [place] to place the order after the pandemic settles down.    Raynaud's disease without gangrene: Not active recently. No digital ulcerations or signs of gangrene noted.   Fibromyalgia: She is not having generalized muscle aches or muscle tenderness at this time. She does have chronic fatigue which has been stable.   Spondylosis of lumbar region without myelopathy or radiculopathy: She had a MRI of the lumbar spine on 10/26/16 which revealed L5-S1 a broad based disc bulge and L4-L5 mild broad based disc bulge.  She has chronic lower back pain and stiffness.  She has been to PT in the past and performs stretching exercises daily.   Osteopenia of multiple sites: DEXA 05/19/18: Left femoral neck BMD 0.596 T-score -2.3.  She is due to update DEXA in August 2021. She takes vitamin D 2,000 units by mouth daily. We discussed the importance obtaining 1200 mg of calcium daily.    Other medical conditions are listed as follows:   Essential hypertension  History of macular degeneration  Follow Up Instructions: She will follow up in 3 months.    I discussed the assessment and treatment plan with the patient. The patient was provided an opportunity to ask questions and all were answered. The patient agreed with the plan and demonstrated an understanding of the instructions.   The  patient was advised to call back or seek an in-person evaluation if the symptoms worsen or if the condition fails to improve as anticipated.  I provided 25 minutes of non-face-to-face time during this encounter.   Bo Merino, MD   Scribed by-  [name], PA-C

## 2019-09-26 ENCOUNTER — Other Ambulatory Visit: Payer: Self-pay | Admitting: *Deleted

## 2019-09-26 ENCOUNTER — Encounter: Payer: Self-pay | Admitting: Rheumatology

## 2019-09-26 ENCOUNTER — Telehealth (INDEPENDENT_AMBULATORY_CARE_PROVIDER_SITE_OTHER): Payer: 59 | Admitting: Rheumatology

## 2019-09-26 ENCOUNTER — Other Ambulatory Visit: Payer: Self-pay

## 2019-09-26 DIAGNOSIS — Z79899 Other long term (current) drug therapy: Secondary | ICD-10-CM | POA: Diagnosis not present

## 2019-09-26 DIAGNOSIS — I7300 Raynaud's syndrome without gangrene: Secondary | ICD-10-CM

## 2019-09-26 DIAGNOSIS — G8929 Other chronic pain: Secondary | ICD-10-CM

## 2019-09-26 DIAGNOSIS — M797 Fibromyalgia: Secondary | ICD-10-CM

## 2019-09-26 DIAGNOSIS — M1990 Unspecified osteoarthritis, unspecified site: Secondary | ICD-10-CM

## 2019-09-26 DIAGNOSIS — Z8679 Personal history of other diseases of the circulatory system: Secondary | ICD-10-CM

## 2019-09-26 DIAGNOSIS — M8589 Other specified disorders of bone density and structure, multiple sites: Secondary | ICD-10-CM

## 2019-09-26 DIAGNOSIS — M533 Sacrococcygeal disorders, not elsewhere classified: Secondary | ICD-10-CM

## 2019-09-26 DIAGNOSIS — Z8669 Personal history of other diseases of the nervous system and sense organs: Secondary | ICD-10-CM

## 2019-09-26 DIAGNOSIS — I10 Essential (primary) hypertension: Secondary | ICD-10-CM

## 2019-09-26 DIAGNOSIS — M47816 Spondylosis without myelopathy or radiculopathy, lumbar region: Secondary | ICD-10-CM

## 2019-09-26 MED ORDER — SULFASALAZINE 500 MG PO TBEC: DELAYED_RELEASE_TABLET | ORAL | 0 refills | Status: DC

## 2019-09-26 MED ORDER — FABB 2.2-25-1 MG PO TABS: 1.0000 | ORAL_TABLET | Freq: Every day | ORAL | 0 refills | Status: DC

## 2019-10-03 ENCOUNTER — Telehealth: Payer: Self-pay | Admitting: Rheumatology

## 2019-10-03 NOTE — Telephone Encounter (Signed)
LMOM for patient to call and schedule 3 month follow-up appointment. °

## 2019-10-03 NOTE — Telephone Encounter (Signed)
-----   Message from Shona Needles, RT sent at 09/26/2019  2:52 PM EST ----- Regarding: 3 MONTH F/U

## 2019-10-08 ENCOUNTER — Telehealth: Payer: Self-pay | Admitting: Rheumatology

## 2019-10-08 DIAGNOSIS — Z79899 Other long term (current) drug therapy: Secondary | ICD-10-CM

## 2019-10-08 NOTE — Telephone Encounter (Signed)
Lab orders released for [facility].  

## 2019-10-08 NOTE — Telephone Encounter (Signed)
Patient called requesting labwork orders be sent to [facility] on [address] in [place].  Patient states she will be going tomorrow 10/09/19.

## 2019-10-09 ENCOUNTER — Ambulatory Visit: Payer: 59 | Admitting: Physician Assistant

## 2019-10-11 ENCOUNTER — Telehealth: Payer: Self-pay

## 2019-10-11 LAB — CMP14+EGFR
ALT: 19 IU/L (ref 0–32)
AST: 24 IU/L (ref 0–40)
Albumin/Globulin Ratio: 1.9 (ref 1.2–2.2)
Albumin: 4.6 g/dL (ref 3.8–4.8)
Alkaline Phosphatase: 84 IU/L (ref 39–117)
BUN/Creatinine Ratio: 9 — ABNORMAL LOW (ref 12–28)
BUN: 8 mg/dL (ref 8–27)
Bilirubin Total: 0.3 mg/dL (ref 0.0–1.2)
CO2: 24 mmol/L (ref 20–29)
Calcium: 10.1 mg/dL (ref 8.7–10.3)
Chloride: 97 mmol/L (ref 96–106)
Creatinine, Ser: 0.91 mg/dL (ref 0.57–1.00)
GFR calc Af Amer: 78 mL/min/{1.73_m2} (ref >59)
GFR calc non Af Amer: 68 mL/min/{1.73_m2} (ref >59)
Globulin, Total: 2.4 g/dL (ref 1.5–4.5)
Glucose: 126 mg/dL — ABNORMAL HIGH (ref 65–99)
Potassium: 3.7 mmol/L (ref 3.5–5.2)
Sodium: 136 mmol/L (ref 134–144)
Total Protein: 7 g/dL (ref 6.0–8.5)

## 2019-10-11 LAB — CBC WITH DIFFERENTIAL/PLATELET
Basophils Absolute: 0 10*3/uL (ref 0.0–0.2)
Basos: 0 %
EOS (ABSOLUTE): 0.1 10*3/uL (ref 0.0–0.4)
Eos: 1 %
Hematocrit: 38.9 % (ref 34.0–46.6)
Hemoglobin: 13.1 g/dL (ref 11.1–15.9)
Immature Grans (Abs): 0 10*3/uL (ref 0.0–0.1)
Immature Granulocytes: 0 %
Lymphocytes Absolute: 1.5 10*3/uL (ref 0.7–3.1)
Lymphs: 30 %
MCH: 30 pg (ref 26.6–33.0)
MCHC: 33.7 g/dL (ref 31.5–35.7)
MCV: 89 fL (ref 79–97)
Monocytes Absolute: 0.5 10*3/uL (ref 0.1–0.9)
Monocytes: 10 %
Neutrophils Absolute: 2.8 10*3/uL (ref 1.4–7.0)
Neutrophils: 59 %
Platelets: 347 10*3/uL (ref 150–450)
RBC: 4.36 x10E6/uL (ref 3.77–5.28)
RDW: 11.7 % (ref 11.7–15.4)
WBC: 4.9 10*3/uL (ref 3.4–10.8)

## 2019-10-11 MED ORDER — SULFASALAZINE 500 MG PO TBEC: DELAYED_RELEASE_TABLET | ORAL | 0 refills | Status: DC

## 2019-10-11 MED ORDER — FABB 2.2-25-1 MG PO TABS: 1.0000 | ORAL_TABLET | Freq: Every day | ORAL | 0 refills | Status: DC

## 2019-10-11 NOTE — Telephone Encounter (Signed)
CBC is normal.  CMP indicates elevated glucose probably not fasting.

## 2019-10-11 NOTE — Telephone Encounter (Signed)
Patient requested refills of FABB and SSZ to be sent to [facility].   Last Visit: 09/26/2019 telemedicine  Next Visit: message sent to the front desk to schedule.  Labs: 10/10/2019 CBC is normal. CMP indicates elevated glucose probably not fasting.  Okay to refill per Dr. Estanislado Pandy.

## 2019-12-10 ENCOUNTER — Other Ambulatory Visit: Payer: Self-pay | Admitting: Rheumatology

## 2019-12-10 NOTE — Telephone Encounter (Signed)
Please schedule patient for a follow up visit. Patient due March 2021. Thanks!  

## 2019-12-10 NOTE — Telephone Encounter (Signed)
Last Visit: 09/26/19 Next Visit: due March 2021. Message sent to the front to schedule patient.  Okay to refill per Dr. Estanislado Pandy

## 2019-12-12 ENCOUNTER — Other Ambulatory Visit (HOSPITAL_COMMUNITY): Payer: Self-pay | Admitting: Obstetrics & Gynecology

## 2019-12-12 ENCOUNTER — Other Ambulatory Visit (HOSPITAL_COMMUNITY): Payer: Self-pay | Admitting: Gynecology

## 2019-12-12 DIAGNOSIS — Z1231 Encounter for screening mammogram for malignant neoplasm of breast: Secondary | ICD-10-CM

## 2019-12-26 ENCOUNTER — Other Ambulatory Visit: Payer: Self-pay

## 2019-12-26 ENCOUNTER — Ambulatory Visit (HOSPITAL_COMMUNITY)
Admission: RE | Admit: 2019-12-26 | Discharge: 2019-12-26 | Disposition: A | Discharge status: Home / Self Care | Payer: 59 | Source: Ambulatory Visit | Attending: Obstetrics & Gynecology | Admitting: Obstetrics & Gynecology

## 2019-12-26 DIAGNOSIS — Z1231 Encounter for screening mammogram for malignant neoplasm of breast: Secondary | ICD-10-CM | POA: Insufficient documentation

## 2020-01-08 ENCOUNTER — Other Ambulatory Visit: Payer: Self-pay | Admitting: Rheumatology

## 2020-01-08 NOTE — Telephone Encounter (Addendum)
Last Visit: 09/26/19 Next Visit: 4/1 /21 Labs: 10/10/19 CBC is normal. CMP indicates elevated glucose  TB Gold: 12/12/18 Neg   Patient advised she due to update labs. Patient states she does not need a refill at this time. Patient will update labs at appointment on 01/10/20.

## 2020-01-08 NOTE — Progress Notes (Signed)
Office Visit Note  Patient: Monica [name]             Date of Birth: [birth year]           MRN: [id number]             PCP: Lemmie Evens, MD Referring: Lemmie Evens, MD Visit Date: 01/10/2020 Occupation: @GUAROCC @  Subjective:  Left sided lower back pain   History of Present Illness: Monica [name] is a 64 y.o. female with history of inflammatory arthritis and iritis.  Patient is taking Humira 40 mg sq injections every 14 days and sulfasalazine 500 mg 2 tablets by mouth twice daily.  She continues to have chronic lower back pain.  She has been experiencing intermittent left-sided sciatica.  She is also had severe nocturnal pain.  According to the patient she has gone to physical therapy in the past which was helpful.  She has been performing home exercises but has not noticed any improvement.  She has had injections by Dr. Ernestina Patches in the past.  Her left SI joint was injected on 07/10/2019. She states that she experienced discomfort and swelling in her left wrist recently after performing home exercises and plank to help with her lower back.  She states that the left wrist pain has resolved.  She denies any other joint pain or joint swelling.  She denies any Achilles denies or plantar fasciitis.  She denies any recent iritis flares. s   Activities of Daily Living:  Patient reports morning stiffness for  30 minutes.   Patient Reports nocturnal pain.  Difficulty dressing/grooming: Denies Difficulty climbing stairs: Denies Difficulty getting out of chair: Denies Difficulty using hands for taps, buttons, cutlery, and/or writing: Denies  Review of Systems  Constitutional: Negative for fatigue.  HENT: Negative for mouth sores, mouth dryness and nose dryness.   Eyes: Negative for pain, visual disturbance and dryness.  Respiratory: Negative for cough, hemoptysis, shortness of breath and difficulty breathing.   Cardiovascular: Negative for chest pain, palpitations, hypertension and  swelling in legs/feet.  Gastrointestinal: Positive for constipation. Negative for blood in stool and diarrhea.  Endocrine: Negative for increased urination.  Genitourinary: Negative for painful urination.  Musculoskeletal: Positive for arthralgias, joint pain and morning stiffness. Negative for joint swelling, myalgias, muscle weakness, muscle tenderness and myalgias.  Skin: Negative for color change, pallor, rash, hair loss, nodules/bumps, skin tightness, ulcers and sensitivity to sunlight.  Allergic/Immunologic: Negative for susceptible to infections.  Neurological: Negative for dizziness, numbness, headaches and weakness.  Hematological: Negative for swollen glands.  Psychiatric/Behavioral: Positive for sleep disturbance. Negative for depressed mood. The patient is not nervous/anxious.     PMFS History:  Patient Active Problem List   Diagnosis Date Noted  . Special screening for malignant neoplasms, colon   . High risk medication use 01/13/2017  . History of iritis 01/13/2017  . Essential hypertension 01/13/2017  . Macular degeneration 01/13/2017  . 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 Leg weakness 07/08/2014  . Difficulty walking 07/08/2014  . Vaginal dryness   . 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 Raynaud disease   . Fibromyalgia   . Inflammatory arthritis   . Osteopenia     Past Medical History:  Diagnosis Date  . Arthritis   . Fibromyalgia   . Macular degeneration    Left eye  . Osteopenia 05/2018   T score -2.3 FRAX 11% / 1.8% overall stable from prior DEXA  . Raynaud disease   .  Reiters syndrome   . Sleep disturbance   . Vaginal dryness     Family History  Problem Relation Age of Onset  . Hypertension Sister   . Heart disease Brother   . Hyperlipidemia Brother   . Heart attack Brother   . Alzheimer's disease Mother   . Osteoarthritis Mother   . High Cholesterol Sister   . Heart attack Brother   . Heart disease Brother    . Healthy Son   . Healthy Son    Past Surgical History:  Procedure Laterality Date  . BREAST SURGERY     LEFT BR LUMP-Benign  . COLONOSCOPY N/A 08/04/2018   Procedure: COLONOSCOPY;  Surgeon: Danie Binder, MD;  [facility];  Service: Endoscopy;  Laterality: N/A;  8:30  . POLYPECTOMY  08/04/2018   Procedure: POLYPECTOMY;  Surgeon: Danie Binder, MD;  [facility];  Service: Endoscopy;;   Social History   Social History Narrative  . Not on file   Immunization History  Administered Date(s) Administered  . Influenza,inj,Quad PF,6+ Mos 08/24/2012, 09/25/2013, 11/13/2014, 11/17/2017     Objective: Vital Signs: BP 136/90 (BP Location: Left Arm, Patient Position: Sitting, Cuff Size: Small)   Pulse 89   Resp 13   Ht 5\' 4"  (1.626 m)   Wt 136 lb 6.4 oz (61.9 kg)   BMI 23.41 kg/m    Physical Exam Vitals and nursing note reviewed.  Constitutional:      Appearance: She is well-developed.  HENT:     Head: Normocephalic and atraumatic.  Eyes:     Conjunctiva/sclera: Conjunctivae normal.  Pulmonary:     Effort: Pulmonary effort is normal.  Abdominal:     General: Bowel sounds are normal.     Palpations: Abdomen is soft.  Musculoskeletal:     Cervical back: Normal range of motion.  Lymphadenopathy:     Cervical: No cervical adenopathy.  Skin:    General: Skin is warm and dry.     Capillary Refill: Capillary refill takes less than 2 seconds.  Neurological:     Mental Status: She is alert and oriented to person, place, and time.  Psychiatric:        Behavior: Behavior normal.      Musculoskeletal Exam: C-spine limited ROM with lateral rotation.  Thoracic spine and lumbar spine good ROM.  Midline spinal tenderness in the lumbar region.  No SI joint tenderness.  Shoulder joints, elbow joints, wrist joints, MCPs, PIPs, and DIPs good ROM with no synovitis.  PIP and DIP thickening consistent with osteoarthritis.  Hip joints, knee joints, ankle joints,  MTPs, PIPs, and DIPs good ROM with no synovitis.  No warmth or effusion of knee joints.  No tenderness or swelling of ankle joints.  No achilles tendonitis or plantar fasciitis.   CDAI Exam: CDAI Score: -- Patient Global: --; Provider Global: -- Swollen: --; Tender: -- Joint Exam 01/10/2020   No joint exam has been documented for this visit   There is currently no information documented on the homunculus. Go to the Rheumatology activity and complete the homunculus joint exam.  Investigation: No additional findings.  Imaging: MM 3D SCREEN BREAST BILATERAL  Result Date: 12/26/2019 CLINICAL DATA:  Screening. EXAM: DIGITAL SCREENING BILATERAL MAMMOGRAM WITH TOMO AND CAD COMPARISON:  Previous exam(s). ACR Breast Density Category c: The breast tissue is heterogeneously dense, which may obscure small masses. FINDINGS: There are no findings suspicious for malignancy. Images were processed with CAD. IMPRESSION: No mammographic evidence of  malignancy. A result letter of this screening mammogram will be mailed directly to the patient. RECOMMENDATION: Screening mammogram in one year. (Code:SM-B-01Y) BI-RADS CATEGORY  1: Negative. Electronically Signed   By: Kristopher Oppenheim M.D.   On: 12/26/2019 13:13    Recent Labs: Lab Results  Component Value Date   WBC 4.9 10/10/2019   HGB 13.1 10/10/2019   PLT 347 10/10/2019   NA 136 10/10/2019   K 3.7 10/10/2019   CL 97 10/10/2019   CO2 24 10/10/2019   GLUCOSE 126 (H) 10/10/2019   BUN 8 10/10/2019   CREATININE 0.91 10/10/2019   BILITOT 0.3 10/10/2019   ALKPHOS 84 10/10/2019   AST 24 10/10/2019   ALT 19 10/10/2019   PROT 7.0 10/10/2019   ALBUMIN 4.6 10/10/2019   CALCIUM 10.1 10/10/2019   GFRAA 78 10/10/2019   QFTBGOLDPLUS NEGATIVE 12/12/2018    Speciality Comments: TB gold neg 01/25/18 HIV, Hep, IG, SPEP neg 01/25/18  Procedures:  No procedures performed Allergies: Patient has no known allergies.   Assessment / Plan:     Visit Diagnoses:  Inflammatory arthritis: She has no synovitis on exam.  She is clinically doing well on Humira 40 mg subcu injections every 14 days and sulfasalazine 500 mg 2 tablets by mouth twice daily.  She has no synovitis, dactylitis, Achilles tendinitis, plantar fasciitis, or SI joint tenderness on exam.  She continues to have midline spinal tenderness in lumbar region and intermittent left-sided sciatica.  We will proceed with scheduling an MRI of her lumbar spine due to the chronicity and severity of her nocturnal pain.  She is not experiencing any other joint pain or joint swelling at this time.  She will continue on Humira and sulfasalazine as prescribed.  She was advised to notify us if she develops increased joint pain or joint swelling.  She will follow-up in the office in 5 months.  High risk medication use - Humira 40 mg every 14 days and sulfasalazine 500 mg 2 tablets twice daily.  CBC and CMP are drawn on 10/10/2019.  She is due to update lab work today.  CBC, CMP, and TB gold orders were released.- Plan: CBC with Differential/Platelet, COMPLETE METABOLIC PANEL WITH GFR, QuantiFERON-TB Gold Plus  History of iritis: She has not had any recent iritis flares.  No conjunctival injection was noted on exam.  Chronic left SI joint pain: She has no SI joint tenderness on exam today.  She had a left SI joint cortisone injection on 07/10/2019 which provided temporary relief.  She continues to have chronic lower back pain.  She has midline spinal tenderness and left-sided sciatica.  Raynaud's disease without gangrene: Not currently active.  No digital ulcerations or signs of gangrene noted.  Fibromyalgia: She has not had any recent fibromyalgia flares.  She has been experiencing muscle cramps and muscle spasms at bedtime.  We discussed the use of magnesium malate 250 mg by mouth at bedtime as needed for muscle cramps.  She experiences intermittent fatigue secondary to insomnia.  We discussed importance of regular  exercise and good sleep hygiene.  Osteopenia of multiple sites: DEXA 05/16/2018 [facility] spine BMD 0.799 with T score -2.3.  She is taking a daily vitamin D supplement.   Back pain with left-sided radiculopathy -She has been experiencing chronic lower back pain for over 1 year.  She has midline spinal tenderness in the lumbar region and experiences intermittent left-sided sciatica.  She has been to physical therapy in the past and has been performing  home exercises without any improvement in her symptoms.  She has been experiencing increased nocturnal pain.  She takes Tylenol as needed for pain relief.  She had a left SI joint cortisone injection on 07/10/2019 which provided temporary relief.  We will proceed with scheduling an MRI of her lumbar spine.  Plan: MR LUMBAR SPINE WO CONTRAST  Other medical conditions are listed as follows:   Essential hypertension  History of macular degeneration  History of hypertension   Orders: Orders Placed This Encounter  Procedures  . MR LUMBAR SPINE WO CONTRAST  . CBC with Differential/Platelet  . COMPLETE METABOLIC PANEL WITH GFR  . QuantiFERON-TB Gold Plus   Meds ordered this encounter  Medications  . Folic Acid-Vit Q000111Q [id number] (FABB) 2.2-25-1 MG TABS    Sig: Take 1 tablet by mouth daily.    Dispense:  30 tablet    Refill:  2  . sulfaSALAzine (AZULFIDINE) 500 MG EC tablet    Sig: TAKE (2) TABLETS BY MOUTH TWICE DAILY.    Dispense:  360 tablet    Refill:  0    Face-to-face time spent with patient was 30 minutes. Greater than 50% of time was spent in counseling and coordination of care.  Follow-Up Instructions: Return in about 5 months (around 06/11/2020) for Inflammatory arthritis.   Ofilia Neas, PA-C  Note - This record has been created using Dragon software.  Chart creation errors have been sought, but may not always  have been located. Such creation errors do not reflect on  the standard of medical care.

## 2020-01-10 ENCOUNTER — Other Ambulatory Visit: Payer: Self-pay

## 2020-01-10 ENCOUNTER — Ambulatory Visit: Payer: 59 | Admitting: Physician Assistant

## 2020-01-10 ENCOUNTER — Encounter: Payer: Self-pay | Admitting: Physician Assistant

## 2020-01-10 VITALS — BP 136/90 | HR 89 | Resp 13 | Ht 64.0 in | Wt 136.4 lb

## 2020-01-10 DIAGNOSIS — Z8669 Personal history of other diseases of the nervous system and sense organs: Secondary | ICD-10-CM | POA: Diagnosis not present

## 2020-01-10 DIAGNOSIS — G8929 Other chronic pain: Secondary | ICD-10-CM

## 2020-01-10 DIAGNOSIS — Z79899 Other long term (current) drug therapy: Secondary | ICD-10-CM

## 2020-01-10 DIAGNOSIS — I7300 Raynaud's syndrome without gangrene: Secondary | ICD-10-CM

## 2020-01-10 DIAGNOSIS — M533 Sacrococcygeal disorders, not elsewhere classified: Secondary | ICD-10-CM | POA: Diagnosis not present

## 2020-01-10 DIAGNOSIS — M797 Fibromyalgia: Secondary | ICD-10-CM

## 2020-01-10 DIAGNOSIS — M5410 Radiculopathy, site unspecified: Secondary | ICD-10-CM

## 2020-01-10 DIAGNOSIS — M1990 Unspecified osteoarthritis, unspecified site: Secondary | ICD-10-CM | POA: Diagnosis not present

## 2020-01-10 DIAGNOSIS — M8589 Other specified disorders of bone density and structure, multiple sites: Secondary | ICD-10-CM

## 2020-01-10 DIAGNOSIS — I10 Essential (primary) hypertension: Secondary | ICD-10-CM

## 2020-01-10 DIAGNOSIS — Z8679 Personal history of other diseases of the circulatory system: Secondary | ICD-10-CM

## 2020-01-10 MED ORDER — SULFASALAZINE 500 MG PO TBEC: DELAYED_RELEASE_TABLET | ORAL | 0 refills | Status: DC

## 2020-01-10 MED ORDER — FABB 2.2-25-1 MG PO TABS: 1.0000 | ORAL_TABLET | Freq: Every day | ORAL | 2 refills | Status: DC

## 2020-01-10 NOTE — Patient Instructions (Signed)
Magnesium malate 250 mg at bedtime for muscle cramps 

## 2020-01-12 LAB — COMPLETE METABOLIC PANEL WITH GFR
AG Ratio: 1.4 (calc) (ref 1.0–2.5)
ALT: 20 U/L (ref 6–29)
AST: 23 U/L (ref 10–35)
Albumin: 4 g/dL (ref 3.6–5.1)
Alkaline phosphatase (APISO): 69 U/L (ref 37–153)
BUN: 9 mg/dL (ref 7–25)
CO2: 28 mmol/L (ref 20–32)
Calcium: 9.4 mg/dL (ref 8.6–10.4)
Chloride: 102 mmol/L (ref 98–110)
Creat: 0.95 mg/dL (ref 0.50–0.99)
GFR, Est African American: 74 mL/min/{1.73_m2} (ref >=60)
GFR, Est Non African American: 64 mL/min/{1.73_m2} (ref >=60)
Globulin: 2.8 g/dL (calc) (ref 1.9–3.7)
Glucose, Bld: 100 mg/dL — ABNORMAL HIGH (ref 65–99)
Potassium: 4.3 mmol/L (ref 3.5–5.3)
Sodium: 139 mmol/L (ref 135–146)
Total Bilirubin: 0.3 mg/dL (ref 0.2–1.2)
Total Protein: 6.8 g/dL (ref 6.1–8.1)

## 2020-01-12 LAB — CBC WITH DIFFERENTIAL/PLATELET
Absolute Monocytes: 440 cells/uL (ref 200–950)
Basophils Absolute: 20 cells/uL (ref 0–200)
Basophils Relative: 0.4 %
Eosinophils Absolute: 50 cells/uL (ref 15–500)
Eosinophils Relative: 1 %
HCT: 38.8 % (ref 35.0–45.0)
Hemoglobin: 12.8 g/dL (ref 11.7–15.5)
Lymphs Abs: 1990 cells/uL (ref 850–3900)
MCH: 30.2 pg (ref 27.0–33.0)
MCHC: 33 g/dL (ref 32.0–36.0)
MCV: 91.5 fL (ref 80.0–100.0)
MPV: 8.6 fL (ref 7.5–12.5)
Monocytes Relative: 8.8 %
Neutro Abs: 2500 cells/uL (ref 1500–7800)
Neutrophils Relative %: 50 %
Platelets: 324 10*3/uL (ref 140–400)
RBC: 4.24 10*6/uL (ref 3.80–5.10)
RDW: 12 % (ref 11.0–15.0)
Total Lymphocyte: 39.8 %
WBC: 5 10*3/uL (ref 3.8–10.8)

## 2020-01-12 LAB — QUANTIFERON-TB GOLD PLUS
Mitogen-NIL: >10 IU/mL
NIL: 0.03 IU/mL
QuantiFERON-TB Gold Plus: NEGATIVE
TB1-NIL: 0.01 IU/mL
TB2-NIL: 0.01 IU/mL

## 2020-01-14 NOTE — Telephone Encounter (Signed)
It is unlikely that the rash is a reaction to Humira.   The rash is not consistent with Shingles since this rash typically follows only one dermatome.   It is difficult to say if the rash is a reaction to the Moderna vaccine. Please advise the patient to follow up with PCP for further evaluation.

## 2020-01-14 NOTE — Progress Notes (Signed)
CBC and CMP WNL.  TB gold negative.

## 2020-01-25 ENCOUNTER — Other Ambulatory Visit: Payer: Self-pay | Admitting: Rheumatology

## 2020-01-28 NOTE — Telephone Encounter (Signed)
Last Visit: 01/10/20 Next Visit: 04/10/20 Labs: 01/10/20 WNL TB Gold: 01/10/20 Neg  Current Dose per office note on 01/10/20: Humira 40 mg every 14 days   Okay to refill per Dr. Estanislado Pandy

## 2020-02-07 ENCOUNTER — Ambulatory Visit: Payer: 59 | Admitting: Physician Assistant

## 2020-02-25 ENCOUNTER — Ambulatory Visit (HOSPITAL_COMMUNITY): Payer: 59

## 2020-02-26 ENCOUNTER — Ambulatory Visit (HOSPITAL_COMMUNITY): Payer: 59

## 2020-03-07 ENCOUNTER — Other Ambulatory Visit: Payer: Self-pay

## 2020-03-07 ENCOUNTER — Ambulatory Visit (HOSPITAL_COMMUNITY)
Admission: RE | Admit: 2020-03-07 | Discharge: 2020-03-07 | Disposition: A | Discharge status: Home / Self Care | Payer: 59 | Source: Ambulatory Visit | Attending: Physician Assistant | Admitting: Physician Assistant

## 2020-03-07 DIAGNOSIS — M5410 Radiculopathy, site unspecified: Secondary | ICD-10-CM | POA: Insufficient documentation

## 2020-03-11 NOTE — Progress Notes (Signed)
MRI of the lumbar spine revealed a broad based disc bulge at L4-L5 and L5-S1.  She has mild foraminal stenosis and fact joint arthropathy as described.   Please notify the patient and refer her to Dr. Louanne Skye for further evaluation and management.

## 2020-03-13 ENCOUNTER — Telehealth: Payer: Self-pay | Admitting: Rheumatology

## 2020-03-13 ENCOUNTER — Other Ambulatory Visit: Payer: Self-pay | Admitting: *Deleted

## 2020-03-13 DIAGNOSIS — M5136 Other intervertebral disc degeneration, lumbar region: Secondary | ICD-10-CM

## 2020-03-13 NOTE — Telephone Encounter (Signed)
FYI: Patient called to let you know she is okay with being referred to Dr. Louanne Skye, and asked for you to proceed with referral.

## 2020-03-25 ENCOUNTER — Ambulatory Visit: Payer: 59 | Admitting: Specialist

## 2020-03-25 ENCOUNTER — Other Ambulatory Visit: Payer: Self-pay

## 2020-03-25 ENCOUNTER — Encounter: Payer: Self-pay | Admitting: Specialist

## 2020-03-25 VITALS — BP 127/85 | HR 83 | Ht 64.0 in | Wt 136.0 lb

## 2020-03-25 DIAGNOSIS — M47816 Spondylosis without myelopathy or radiculopathy, lumbar region: Secondary | ICD-10-CM

## 2020-03-25 DIAGNOSIS — M5136 Other intervertebral disc degeneration, lumbar region: Secondary | ICD-10-CM | POA: Diagnosis not present

## 2020-03-25 DIAGNOSIS — M5442 Lumbago with sciatica, left side: Secondary | ICD-10-CM | POA: Diagnosis not present

## 2020-03-25 MED ORDER — GABAPENTIN 100 MG PO CAPS: 100.0000 mg | ORAL_CAPSULE | Freq: Every day | ORAL | 3 refills | Status: DC

## 2020-03-25 NOTE — Patient Instructions (Signed)
Avoid frequent bending and stooping  No lifting greater than 10 lbs. May use ice or moist heat for pain. Weight loss is of benefit. Best medication for lumbar disc disease is arthritis medications like tylenol arthritis strength twice a day.  Exercise is important to improve your indurance and does allow people to function better inspite of back pain. Gabapentin 100 mg po at night. Tylenol 500 mg po BID.

## 2020-03-25 NOTE — Progress Notes (Signed)
Office Visit Note   Patient: Monica [name]           Date of Birth: [birth year]           MRN: [id number] Visit Date: 03/25/2020              Requested by: Bo Merino, MD [address] [place],  [place] [address] PCP: Lemmie Evens, MD   Assessment & Plan: Visit Diagnoses:  1. Degenerative disc disease, lumbar   2. Acute left-sided low back pain with left-sided sciatica   3. Spondylosis of lumbar region without myelopathy or radiculopathy     Plan: Avoid frequent bending and stooping  No lifting greater than 10 lbs. May use ice or moist heat for pain. Weight loss is of benefit. Best medication for lumbar disc disease is arthritis medications like tylenol arthritis strength twice a day.  Exercise is important to improve your indurance and does allow people to function better inspite of back pain. Gabapentin 100 mg po at night. Tylenol 500 mg po BID.   Follow-Up Instructions: Return in about 3 months (around 06/25/2020).   Orders:  Orders Placed This Encounter  Procedures  . Ambulatory referral to Physical Therapy   Meds ordered this encounter  Medications  . gabapentin (NEURONTIN) 100 MG capsule    Sig: Take 1 capsule (100 mg total) by mouth at bedtime.    Dispense:  30 capsule    Refill:  3      Procedures: No procedures performed   Clinical Data: Findings:  CLINICAL DATA: Low back pain radiating to the left leg for 5 days  EXAM: MRI LUMBAR SPINE WITHOUT CONTRAST  TECHNIQUE: Multiplanar, multisequence MR imaging of the lumbar spine was performed. No intravenous contrast was administered.  COMPARISON: None.  FINDINGS: Segmentation: Standard.  Alignment: Physiologic.  Vertebrae: No fracture, evidence of discitis, or aggressive bone lesion. 14 mm T2 hyperintense bone lesion in the L5 vertebral body with stippled low signal within the lesion likely reflecting an atypical hemangioma.  Conus medullaris: Extends to the L1 level  and appears normal.  Paraspinal and other soft tissues: No paraspinal abnormality.  Disc levels:  Disc spaces: Degenerative disc disease with disc desiccation at L4-5 and L5-S1.  T12-L1: No significant disc bulge. No evidence of neural foraminal stenosis. No central canal stenosis.  L1-L2: Minimal broad-based disc bulge. No evidence of neural foraminal stenosis. No central canal stenosis.  L2-L3: No significant disc bulge. No evidence of neural foraminal stenosis. No central canal stenosis.  L3-L4: No significant disc bulge. No evidence of neural foraminal stenosis. No central canal stenosis.  L4-L5: Mild broad-based disc bulge with a a right foraminal shallow disc protrusion. No evidence of neural foraminal stenosis. No central canal stenosis.  L5-S1: Broad-based disc bulge with a small left foraminal/lateral disc protrusion abutting the left extraforaminal L5 nerve root. Mild bilateral facet arthropathy. No evidence of neural foraminal stenosis. No central canal stenosis.  IMPRESSION: 1. At L5-S1 there is a broad-based disc bulge with a small left foraminal/lateral disc protrusion abutting the left extraforaminal L5 nerve root. 2. At L4-5 there is a mild broad-based disc bulge with a a right foraminal shallow disc protrusion.   Electronically Signed By: Kathreen Devoid On: 10/26/2016 11:03   Narrative & Impression CLINICAL DATA:  Low back pain. Intermittent left-sided sciatica. Severe nocturnal pain.  EXAM: MRI LUMBAR SPINE WITHOUT CONTRAST  TECHNIQUE: Multiplanar, multisequence MR imaging of the lumbar spine was performed. No intravenous contrast was administered.  COMPARISON:  10/26/2016  FINDINGS: Segmentation:  Standard.  Alignment:  Physiologic.  Vertebrae: No fracture, evidence of discitis, or aggressive bone lesion. T2 hyperintense and T1 intermediate signal bone lesion in the L5 vertebral body unchanged compared with 10/26/2016 with stippled  low signal within the lesion likely reflecting an atypical hemangioma.  Conus medullaris and cauda equina: Conus extends to the L1 level. Conus and cauda equina appear normal.  Paraspinal and other soft tissues: No acute paraspinal abnormality. Left parapelvic cysts.  Disc levels:  Disc spaces: Degenerative disease with disc desiccation throughout the lumbar spine with mild disc height loss at L1-2. reactive endplate changes at S8-N4.  T12-L1: No significant disc bulge. No evidence of neural foraminal stenosis. No central canal stenosis.  L1-L2: Mild broad-based disc bulge. No evidence of neural foraminal stenosis. No central canal stenosis.  L2-L3: Mild broad-based disc bulge. No evidence of neural foraminal stenosis. No central canal stenosis.  L3-L4: Mild broad-based disc bulge. No foraminal stenosis. No central canal stenosis.  L4-L5: Broad-based disc bulge with a small right foraminal disc protrusion. Mild right foraminal stenosis. No left foraminal stenosis. No central canal stenosis.  L5-S1: Mild broad-based disc bulge with a small left foraminal disc protrusion. Mild bilateral facet arthropathy. Mild left foraminal stenosis. No right foraminal stenosis. No central canal stenosis.  IMPRESSION: 1. At L4-5 there is a broad-based disc bulge with a small right foraminal disc protrusion. Mild right foraminal stenosis. 2. At L5-S1 there is a mild broad-based disc bulge with a small left foraminal disc protrusion. Mild bilateral facet arthropathy. Mild left foraminal stenosis.   Electronically Signed   By: Kathreen Devoid   On: 03/08/2020 09:47      Subjective: Chief Complaint  Patient presents with  . Lower Back - Pain    Onset-10/2016 w/ sciatica pain, been dealing w/ it, had injection by Dr. Ernestina Patches in past, Dr. Estanislado Pandy done 2 injections in hip, wakes her @ night, takes, tylenol, & half nerve pill prn, LLE twitches when sits to long, wants to  prevent permit nerve damage    64 year old female with 3 year history of back pain with radiation into the left leg starting in the  Left buttuck and then down behind the left knee and then into the posterolateral left calf. She has been doing some exercises. Started when putting up xmas ornaments she was going up and down stairs, when in the kitchen she sneezed and felt pain in the back and felt in the back and by night was up and down and went to the ER in 2018. Had episodic back pain in the past but nothing as bad as this. She was seen by Dr. Estanislado Pandy and she was given muscle relaxer and meds. She  Saw Dr. Ernestina Patches and had an ESI and this did not help as much as PT which she later had. In 2014 She had an episode of pain and was given PT. 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 She has pain that is less than a 3-4 and she has episodes of pain that is mainly low back pain and there is tightening the small of her back. She can walk a mile, prior to covid she was walking regularly. 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 Pain with bending stooping and twisting  and lifting heavier  Weights.There is pain with long trips and with sitting. She has pain with sitting at church and with  Riding in the car. She has not tried Motrin or Naprosyn, not to take with the Humira. She has taken  Tylenol though its not very helpful. Using Heat and wore out a heating pad, slept every night. Especially when she had sciatica, and used ice, The last episode of sciatica was not as bad as in  2018.   Review of Systems  Constitutional: Positive for activity change and fatigue. Negative for appetite change, chills, diaphoresis, fever and unexpected weight change.  HENT: Positive for mouth sores, rhinorrhea, sinus pressure, sinus pain and  sneezing. Negative for congestion, dental problem, drooling, ear discharge, ear pain, facial swelling, hearing loss, nosebleeds, postnasal drip, sore throat, tinnitus, trouble swallowing and voice change.   Eyes: Negative.  Negative for photophobia, pain, discharge, redness, itching and visual disturbance.  Respiratory: Negative.  Negative for apnea, cough, choking, chest tightness, shortness of breath, wheezing and stridor.   Cardiovascular: Negative.  Negative for chest pain, palpitations and leg swelling.  Gastrointestinal: Positive for abdominal pain (left low abdomenal pain  with back pain ) and constipation (occasional wiht associated cramping). Negative for abdominal distention, anal bleeding, blood in stool, diarrhea, nausea, rectal pain and vomiting.  Endocrine: Positive for cold intolerance (Cold intolerant with fingers and toes changing colors better post menapausal ). Negative for heat intolerance, polydipsia, polyphagia and polyuria.  Genitourinary: Negative.  Negative for difficulty urinating, dyspareunia, dysuria, enuresis, flank pain, frequency and hematuria.  Musculoskeletal: Positive for back pain. Negative for arthralgias, gait problem, joint swelling, myalgias, neck pain and neck stiffness.  Skin: Negative.  Negative for color change, pallor, rash and wound.  Allergic/Immunologic: Positive for environmental allergies. Negative for food allergies and immunocompromised state.  Neurological: Positive for numbness. Negative for dizziness, tremors, seizures, syncope, facial asymmetry, speech difficulty, weakness, light-headedness and headaches.  Hematological: Negative for adenopathy. Bruises/bleeds easily.  Psychiatric/Behavioral: Negative.  Negative for agitation, behavioral problems, confusion, decreased concentration, dysphoric mood, hallucinations, self-injury, sleep disturbance and suicidal ideas. The patient is not nervous/anxious and is not hyperactive.      Objective: Vital  Signs: BP 127/85 (BP Location: Left Arm, Patient Position: Sitting)   Pulse 83   Ht 5\' 4"  (1.626 m)   Wt 136 lb (61.7 kg)   BMI 23.34 kg/m   Physical Exam Constitutional:      Appearance: She is well-developed.  HENT:     Head: Normocephalic and atraumatic.  Eyes:     Pupils: Pupils are equal, round, and reactive to light.  Pulmonary:     Effort: Pulmonary effort is normal.     Breath sounds: Normal breath sounds.  Abdominal:     General: Bowel sounds are normal.     Palpations: Abdomen is soft.  Musculoskeletal:        General: Normal range of motion.     Cervical back: Normal range of motion and neck supple.     Lumbar back: Negative right straight leg raise test and negative left straight leg raise test.  Skin:    General: Skin is warm and dry.  Neurological:     Mental Status: She is alert and oriented to person, place, and time.  Psychiatric:        Behavior: Behavior normal.        Thought Content: Thought content normal.        Judgment: Judgment normal.  Back Exam   Tenderness  The patient is experiencing tenderness in the lumbar.  Range of Motion  Extension: normal  Flexion: normal  Lateral bend right: 40  Lateral bend left: 60  Rotation right: 70  Rotation left: 70   Muscle Strength  Right Quadriceps:  5/5  Left Quadriceps:  5/5  Right Hamstrings:  4/5  Left Hamstrings:  5/5   Tests  Straight leg raise right: negative Straight leg raise left: negative  Reflexes  Patellar: normal Achilles: normal Biceps: normal Babinski's sign: normal   Other  Toe walk: normal Heel walk: normal Sensation: normal Gait: normal   Comments:  Left EHL 4/5 Left wrist pain diagnosis of RA or reiters.  Push off leg to get upright.      Specialty Comments:  No specialty comments available.  Imaging: No results found.   PMFS History: Patient Active Problem List   Diagnosis Date Noted  . Special screening for malignant neoplasms, colon   . High  risk medication use 01/13/2017  . History of iritis 01/13/2017  . Essential hypertension 01/13/2017  . Macular degeneration 01/13/2017  . 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 Leg weakness 07/08/2014  . Difficulty walking 07/08/2014  . Vaginal dryness   . 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 Raynaud disease   . Fibromyalgia   . Inflammatory arthritis   . Osteopenia    Past Medical History:  Diagnosis Date  . Arthritis   . Fibromyalgia   . Macular degeneration    Left eye  . Osteopenia 05/2018   T score -2.3 FRAX 11% / 1.8% overall stable from prior DEXA  . Raynaud disease   . Reiters syndrome   . Sleep disturbance   . Vaginal dryness     Family History  Problem Relation Age of Onset  . Hypertension Sister   . Heart disease Brother   . Hyperlipidemia Brother   . Heart attack Brother   . Alzheimer's disease Mother   . Osteoarthritis Mother   . High Cholesterol Sister   . Heart attack Brother   . Heart disease Brother   . Healthy Son   . Healthy Son     Past Surgical History:  Procedure Laterality Date  . BREAST SURGERY     LEFT BR LUMP-Benign  . COLONOSCOPY N/A 08/04/2018   Procedure: COLONOSCOPY;  Surgeon: Danie Binder, MD;  Location: [facility];  Service: Endoscopy;  Laterality: N/A;  8:30  . POLYPECTOMY  08/04/2018   Procedure: POLYPECTOMY;  Surgeon: Danie Binder, MD;  Location: [facility];  Service: Endoscopy;;   Social History   Occupational History  . Not on file  Tobacco Use  . Smoking status: Never Smoker  . Smokeless tobacco: Never Used  Vaping Use  . Vaping Use: Never used  Substance and Sexual Activity  . Alcohol use: Not Currently  . Drug use: Never  . Sexual activity: Yes    Birth control/protection: Post-menopausal    Comment: 1st intercourse 64 yo- Fewer than 5 partners

## 2020-03-26 ENCOUNTER — Ambulatory Visit (HOSPITAL_COMMUNITY): Payer: 59 | Admitting: Physical Therapy

## 2020-03-26 ENCOUNTER — Telehealth (HOSPITAL_COMMUNITY): Payer: Self-pay | Admitting: Physical Therapy

## 2020-03-26 NOTE — Telephone Encounter (Signed)
pt cancelled appt for today because she has been up all night sick

## 2020-04-04 NOTE — Progress Notes (Signed)
Office Visit Note  Patient: Monica [name]             Date of Birth: [birth year]           MRN: [id number]             PCP: Lemmie Evens, MD Referring: Lemmie Evens, MD Visit Date: 04/10/2020 Occupation: @GUAROCC @  Subjective:  Lower back pain.   History of Present Illness: Monica [name] is a 64 y.o. female with history of chronic inflammatory arthritis.  She states she is generally well on Humira as regards to joint swelling.  She continues to have pain and discomfort in her lower back and left SI joint.  She states she was seen by Dr. Louanne Skye recently and was given a prescription of gabapentin to be taken on as needed basis.  She had intolerance to the medication and could not sleep all night.  She is going to physical therapy now.  She states that after the physical therapy she experiences increased pain.  She has not had any recent episodes of Raynaud's.  Activities of Daily Living:  Patient reports morning stiffness for 30 minutes.   Patient Reports nocturnal pain.  Difficulty dressing/grooming: Denies Difficulty climbing stairs: Denies Difficulty getting out of chair: Denies Difficulty using hands for taps, buttons, cutlery, and/or writing: Denies  Review of Systems  Constitutional: Positive for fatigue. Negative for night sweats, weight gain and weight loss.  HENT: Negative for mouth sores, trouble swallowing, trouble swallowing, mouth dryness and nose dryness.   Eyes: Negative for pain, redness, visual disturbance and dryness.  Respiratory: Negative for cough, shortness of breath and difficulty breathing.   Cardiovascular: Negative for chest pain, palpitations, hypertension, irregular heartbeat and swelling in legs/feet.  Gastrointestinal: Negative for blood in stool, constipation and diarrhea.  Endocrine: Negative for increased urination.  Genitourinary: Negative for vaginal dryness.  Musculoskeletal: Positive for arthralgias, joint pain, myalgias, morning  stiffness and myalgias. Negative for joint swelling, muscle weakness and muscle tenderness.  Skin: Negative for color change, rash, hair loss, skin tightness, ulcers and sensitivity to sunlight.  Allergic/Immunologic: Negative for susceptible to infections.  Neurological: Negative for dizziness, memory loss, night sweats and weakness.  Hematological: Negative for swollen glands.  Psychiatric/Behavioral: Positive for sleep disturbance. Negative for depressed mood. The patient is not nervous/anxious.     PMFS History:  Patient Active Problem List   Diagnosis Date Noted  . Special screening for malignant neoplasms, colon   . High risk medication use 01/13/2017  . History of iritis 01/13/2017  . Essential hypertension 01/13/2017  . Macular degeneration 01/13/2017  . 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 Leg weakness 07/08/2014  . Difficulty walking 07/08/2014  . Vaginal dryness   . 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 Raynaud disease   . Fibromyalgia   . Inflammatory arthritis   . Osteopenia     Past Medical History:  Diagnosis Date  . Arthritis   . Fibromyalgia   . Macular degeneration    Left eye  . Osteopenia 05/2018   T score -2.3 FRAX 11% / 1.8% overall stable from prior DEXA  . Raynaud disease   . Reiters syndrome   . Sleep disturbance   . Vaginal dryness     Family History  Problem Relation Age of Onset  . Hypertension Sister   . Heart disease Brother   . Hyperlipidemia Brother   . Heart attack Brother   . Alzheimer's disease Mother   .  Osteoarthritis Mother   . High Cholesterol Sister   . Heart attack Brother   . Heart disease Brother   . Healthy Son   . Healthy Son    Past Surgical History:  Procedure Laterality Date  . BREAST SURGERY     LEFT BR LUMP-Benign  . COLONOSCOPY N/A 08/04/2018   Procedure: COLONOSCOPY;  Surgeon: Danie Binder, MD;  [facility];  Service: Endoscopy;  Laterality: N/A;  8:30  .  POLYPECTOMY  08/04/2018   Procedure: POLYPECTOMY;  Surgeon: Danie Binder, MD;  Location: [facility];  Service: Endoscopy;;   Social History   Social History Narrative  . Not on file   Immunization History  Administered Date(s) Administered  . Influenza,inj,Quad PF,6+ Mos 08/24/2012, 09/25/2013, 11/13/2014, 11/17/2017     Objective: Vital Signs: BP 133/87 (BP Location: Left Arm, Patient Position: Sitting, Cuff Size: Small)   Pulse 72   Resp 12   Ht 5\' 4"  (1.626 m)   Wt 132 lb (59.9 kg)   BMI 22.66 kg/m    Physical Exam Vitals and nursing note reviewed.  Constitutional:      Appearance: She is well-developed.  HENT:     Head: Normocephalic and atraumatic.  Eyes:     Conjunctiva/sclera: Conjunctivae normal.  Cardiovascular:     Rate and Rhythm: Normal rate and regular rhythm.     Heart sounds: Normal heart sounds.  Pulmonary:     Effort: Pulmonary effort is normal.     Breath sounds: Normal breath sounds.  Abdominal:     General: Bowel sounds are normal.     Palpations: Abdomen is soft.  Musculoskeletal:     Cervical back: Normal range of motion.  Lymphadenopathy:     Cervical: No cervical adenopathy.  Skin:    General: Skin is warm and dry.     Capillary Refill: Capillary refill takes less than 2 seconds.  Neurological:     Mental Status: She is alert and oriented to person, place, and time.  Psychiatric:        Behavior: Behavior normal.      Musculoskeletal Exam: C-spine thoracic and lumbar spine were in good range of motion.  She had discomfort range of motion of her lumbar spine.  She has some tenderness over left SI joint.  Shoulder joints, elbow joints, wrist joints, MCPs PIPs and DIPs with good range of motion with no synovitis.  Hip joints, knee joints, ankles, MTPs and PIPs with good range of motion with no synovitis.  CDAI Exam: CDAI Score: -- Patient Global: --; Provider Global: -- Swollen: --; Tender: -- Joint Exam 04/10/2020   No joint  exam has been documented for this visit   There is currently no information documented on the homunculus. Go to the Rheumatology activity and complete the homunculus joint exam.  Investigation: No additional findings.  Imaging: No results found.  Recent Labs: Lab Results  Component Value Date   WBC 5.0 01/10/2020   HGB 12.8 01/10/2020   PLT 324 01/10/2020   NA 139 01/10/2020   K 4.3 01/10/2020   CL 102 01/10/2020   CO2 28 01/10/2020   GLUCOSE 100 (H) 01/10/2020   BUN 9 01/10/2020   CREATININE 0.95 01/10/2020   BILITOT 0.3 01/10/2020   ALKPHOS 84 10/10/2019   AST 23 01/10/2020   ALT 20 01/10/2020   PROT 6.8 01/10/2020   ALBUMIN 4.6 10/10/2019   CALCIUM 9.4 01/10/2020   GFRAA 74 01/10/2020   QFTBGOLDPLUS NEGATIVE 01/10/2020  Speciality Comments: TB gold neg 01/25/18 HIV, Hep, IG, SPEP neg 01/25/18  Procedures:  No procedures performed Allergies: Patient has no known allergies.   Assessment / Plan:     Visit Diagnoses: Inflammatory arthritis-patient has had recurrent swelling in her right wrist joint and her knee joints in the past.  She is also had tenosynovitis.  Her symptoms have improved remarkably on Humira.  She is on Humira and sulfasalazine combination.  No synovitis was noted on the examination today.  High risk medication use - Humira 40 mg every 14 days and sulfasalazine 500 mg 2 tablets twice daily.  - Plan: CBC with Differential/Platelet, COMPLETE METABOLIC PANEL WITH GFR today and then every 3 months.  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  Chronic left SI joint pain-persist.  Raynaud's disease without gangrene-doing well currently.  Fibromyalgia-she continues to have some generalized pain and discomfort.  Osteopenia of multiple sites - DEXA 05/16/2018 [facility] BMD 0.799 with T score -2.3.  Patient will have repeat DEXA with her GYN.  Back pain with left-sided radiculopathy-she is followed by Dr. Louanne Skye currently.  History of macular  degeneration  History of hypertension-her blood pressure is mildly elevated.  Orders: Orders Placed This Encounter  Procedures  . CBC with Differential/Platelet  . COMPLETE METABOLIC PANEL WITH GFR   No orders of the defined types were placed in this encounter.    Follow-Up Instructions: Return in about 5 months (around 09/10/2020) for Inflammatory arthritis.   Bo Merino, MD  Note - This record has been created using Editor, commissioning.  Chart creation errors have been sought, but may not always  have been located. Such creation errors do not reflect on  the standard of medical care.

## 2020-04-09 ENCOUNTER — Other Ambulatory Visit: Payer: Self-pay

## 2020-04-09 ENCOUNTER — Ambulatory Visit (HOSPITAL_COMMUNITY): Payer: 59 | Attending: Specialist | Admitting: Physical Therapy

## 2020-04-09 DIAGNOSIS — G8929 Other chronic pain: Secondary | ICD-10-CM | POA: Insufficient documentation

## 2020-04-09 DIAGNOSIS — R262 Difficulty in walking, not elsewhere classified: Secondary | ICD-10-CM | POA: Diagnosis present

## 2020-04-09 DIAGNOSIS — M5442 Lumbago with sciatica, left side: Secondary | ICD-10-CM | POA: Insufficient documentation

## 2020-04-09 NOTE — Therapy (Signed)
[place], [place], [address] Phone: [phone]   Fax:  [phone]  Physical Therapy Evaluation  Patient Details  Name: Monica [name] MRN: [id number] Date of Birth: 1956-01-17 Referring Provider (PT): Jessy Oto   Encounter Date: 04/09/2020   PT End of Session - 04/09/20 1533    Visit Number 1    Number of Visits 12    Date for PT Re-Evaluation 06/04/20    Authorization Type [employer], no auth, VL 60    Authorization - Visit Number 1    Authorization - Number of Visits 60    Progress Note Due on Visit 10    PT Start Time 9563    PT Stop Time 1530    PT Time Calculation (min) 45 min    Activity Tolerance Patient tolerated treatment well    Behavior During Therapy [facility] for tasks assessed/performed           Past Medical History:  Diagnosis Date  . Arthritis   . Fibromyalgia   . Macular degeneration    Left eye  . Osteopenia 05/2018   T score -2.3 FRAX 11% / 1.8% overall stable from prior DEXA  . Raynaud disease   . Reiters syndrome   . Sleep disturbance   . Vaginal dryness     Past Surgical History:  Procedure Laterality Date  . BREAST SURGERY     LEFT BR LUMP-Benign  . COLONOSCOPY N/A 08/04/2018   Procedure: COLONOSCOPY;  Surgeon: Danie Binder, MD;  [facility];  Service: Endoscopy;  Laterality: N/A;  8:30  . POLYPECTOMY  08/04/2018   Procedure: POLYPECTOMY;  Surgeon: Danie Binder, MD;  [facility];  Service: Endoscopy;;    There were no vitals filed for this visit.    Subjective Assessment - 04/09/20 1457    Subjective States that 3 years ago in December she had pain in her back she had done PT and her back was feeling good. States now over the last 2 years she has good days and bad days. She has been doing some yoga and it feels better (since COVID started). Some days it is bad. She tolerates pain during the day but at night it is worse and the  mornings are bad too. Some nights she wakes up because of pain and she can't go back to sleep (she takes Tylenol). Pain starts in back and goes down into leg and on lateral side of leg. Some days leg pain is pretty constant. States that her knee feels like it has more stress on her knee. States her rheumatologist gave her injections last year and one helped and the other didn't help, states she can't get any more injections.    Pertinent History chronic low back pain, RA    Currently in Pain? Yes    Pain Score 5     Pain Location Back    Pain Orientation Left;Lower    Pain Descriptors / Indicators Throbbing;Tingling    Pain Type Chronic pain    Pain Radiating Towards down lateral side of left leg    Pain Onset More than a month ago    Pain Frequency Intermittent    Aggravating Factors  gardening, lifting    Pain Relieving Factors tylenol, heat, ice              [facility] PT Assessment - 04/09/20 0001      Assessment   Medical Diagnosis  DDD LBP    Referring Provider (PT) Jessy Oto    Prior Therapy yes for her low back years ago      Balance Screen   Has the patient fallen in the past 6 months No      Cognition   Overall Cognitive Status Within Functional Limits for tasks assessed      Observation/Other Assessments   Observations hitches at T12 and L5 with lumabr ROM     Focus on Therapeutic Outcomes (FOTO)  46.4% function       ROM / Strength   AROM / PROM / Strength AROM;Strength      AROM   AROM Assessment Site Lumbar;Hip    Right/Left Hip Right;Left    Right Hip Flexion 120    Right Hip External Rotation  60    Right Hip Internal Rotation  30   felt in left low back    Left Hip Flexion 120    Left Hip External Rotation  60    Left Hip Internal Rotation  10   uncomfortable   Lumbar Flexion 50% limited   pain and difficulty coming up    Lumbar Extension 75% limited   hitches at T12 and L5    Lumbar - Right Side Bend 25% limited   stretching    Lumbar - Left Side  Bend 25% limited    pain on left side and stretch on right side   Lumbar - Right Rotation 50% limited   cramping on right side   Lumbar - Left Rotation 50% limited   no change in symptoms     Strength   Strength Assessment Site Hip    Right/Left Hip Right;Left      Flexibility   Soft Tissue Assessment /Muscle Length yes    Hamstrings full hamstring and quad length noted bilaterally but pain with end range motion of knee extension with hip at 90 degrees.        Palpation   Spinal mobility tenderness with spring testing throughout lumbar spine with referred pain to L5/S1 with PA of L1-L5. Pain with PA of sacrum along lateral edges and apex and base.  - pain limiting assessment    Palpation comment tenderness to palpation in left glutes, lumbar paraspinals. increased resting tone noted on right lumbar paraspinals.                       Objective measurements completed on examination: See above findings.       [facility] Adult PT Treatment/Exercise - 04/09/20 0001      Exercises   Exercises Lumbar      Lumbar Exercises: Stretches   Other Lumbar Stretch Exercise Left sciatic nerve glide x5                   PT Education - 04/09/20 1541    Education Details in current condition, POC and HEP    Person(s) Educated Patient    Methods Explanation    Comprehension Verbalized understanding            PT Short Term Goals - 04/09/20 1534      PT SHORT TERM GOAL #1   Title Patient will be independent in self management strategies to improve quality of life and functional outcomes.    Time 4    Period Weeks    Status New    Target Date 05/07/20      PT SHORT TERM GOAL #2  Title Patient will report at least 25% improvement in overall symptoms and/or function to demonstrate improved functional mobility    Time 4    Period Weeks    Status New    Target Date 05/07/20             PT Long Term Goals - 04/09/20 1535      PT LONG TERM GOAL #1   Title Patient  will report at least 50% improvement in overall symptoms and/or function to demonstrate improved functional mobility    Time 8    Period Weeks    Status New    Target Date 06/04/20      PT LONG TERM GOAL #2   Title Patietn will be able to demonstrate painfree lumbar ROM.    Time 8    Period Weeks    Status New    Target Date 06/04/20      PT LONG TERM GOAL #3   Title Patient will improve on FOTO score to meet predicted outcomes to demonstrate improved functional mobility.    Time 8    Period Weeks    Status New    Target Date 06/04/20                  Plan - 04/09/20 1542    Clinical Impression Statement Patient presents to therapy with complaints of chronic low back pain that radiates into her buttocks and down along the lateral side of her left leg. Hypersensitivity noted throughout lumbar spine and musculature during today's session and patient hitches at T12 and L5 with lumbar movements (sites where she has pain). Patient would benefit from skilled physical therapy to improve functional mobility and improve overall quality of life.    Personal Factors and Comorbidities Comorbidity 1    Comorbidities RA, chronic low abck pain    Examination-Activity Limitations Bend;Lift;Locomotion Level;Sleep;Stairs;Bed Mobility    Examination-Participation Restrictions Yard Work;Cleaning    Stability/Clinical Decision Making Stable/Uncomplicated    Clinical Decision Making Low    Rehab Potential Good    PT Frequency --   12 visits over 8 week certification period   PT Duration 8 weeks    PT Treatment/Interventions ADLs/Self Care Home Management;Aquatic Therapy;Electrical Stimulation;Cryotherapy;Iontophoresis 4mg /ml Dexamethasone;Moist Heat;Traction;Balance training;Therapeutic exercise;Therapeutic activities;Stair training;Gait training;DME Instruction;Neuromuscular re-education;Patient/family education;Manual techniques;Dry needling;Passive range of motion;Joint Manipulations    PT  Next Visit Plan assess repeated movements POE/lx flexion, trial traction, review yoga exercises patient is doing per pt request (if she remembers-wants to make sure she isn't hurting herself with exercises) core strengthening, glute strengthening    PT Home Exercise Plan 6/30 sciatic nerve glide L    Consulted and Agree with Plan of Care Patient           Patient will benefit from skilled therapeutic intervention in order to improve the following deficits and impairments:  Pain, Decreased strength, Decreased activity tolerance, Difficulty walking, Decreased range of motion  Visit Diagnosis: Chronic left-sided low back pain with left-sided sciatica  Difficulty in walking, not elsewhere classified     Problem List Patient Active Problem List   Diagnosis Date Noted  . Special screening for malignant neoplasms, colon   . High risk medication use 01/13/2017  . History of iritis 01/13/2017  . Essential hypertension 01/13/2017  . Macular degeneration 01/13/2017  . 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 Leg weakness 07/08/2014  . Difficulty walking 07/08/2014  . Vaginal dryness   . Reiter's syndrome ([place])   .  Raynaud disease   . Fibromyalgia   . Inflammatory arthritis   . Osteopenia    3:52 PM, 04/09/20 Jerene Pitch, DPT Physical Therapy with [facility]  [phone] office  [place] [address] [place], [place], [address] Phone: [phone]   Fax:  [phone]  Name: Monica [name] MRN: [id number] Date of Birth: January 29, 1956

## 2020-04-10 ENCOUNTER — Ambulatory Visit: Payer: 59 | Admitting: Rheumatology

## 2020-04-10 ENCOUNTER — Encounter: Payer: Self-pay | Admitting: Physician Assistant

## 2020-04-10 VITALS — BP 133/87 | HR 72 | Resp 12 | Ht 64.0 in | Wt 132.0 lb

## 2020-04-10 DIAGNOSIS — M8589 Other specified disorders of bone density and structure, multiple sites: Secondary | ICD-10-CM

## 2020-04-10 DIAGNOSIS — Z79899 Other long term (current) drug therapy: Secondary | ICD-10-CM | POA: Diagnosis not present

## 2020-04-10 DIAGNOSIS — M797 Fibromyalgia: Secondary | ICD-10-CM

## 2020-04-10 DIAGNOSIS — M533 Sacrococcygeal disorders, not elsewhere classified: Secondary | ICD-10-CM

## 2020-04-10 DIAGNOSIS — Z8679 Personal history of other diseases of the circulatory system: Secondary | ICD-10-CM

## 2020-04-10 DIAGNOSIS — M5410 Radiculopathy, site unspecified: Secondary | ICD-10-CM

## 2020-04-10 DIAGNOSIS — Z8669 Personal history of other diseases of the nervous system and sense organs: Secondary | ICD-10-CM

## 2020-04-10 DIAGNOSIS — G8929 Other chronic pain: Secondary | ICD-10-CM

## 2020-04-10 DIAGNOSIS — M1990 Unspecified osteoarthritis, unspecified site: Secondary | ICD-10-CM

## 2020-04-10 DIAGNOSIS — I7300 Raynaud's syndrome without gangrene: Secondary | ICD-10-CM

## 2020-04-10 LAB — COMPLETE METABOLIC PANEL WITH GFR
AG Ratio: 1.7 (calc) (ref 1.0–2.5)
ALT: 19 U/L (ref 6–29)
AST: 24 U/L (ref 10–35)
Albumin: 4.4 g/dL (ref 3.6–5.1)
Alkaline phosphatase (APISO): 72 U/L (ref 37–153)
BUN: 11 mg/dL (ref 7–25)
CO2: 32 mmol/L (ref 20–32)
Calcium: 10.4 mg/dL (ref 8.6–10.4)
Chloride: 102 mmol/L (ref 98–110)
Creat: 0.91 mg/dL (ref 0.50–0.99)
GFR, Est African American: 78 mL/min/{1.73_m2} (ref >=60)
GFR, Est Non African American: 67 mL/min/{1.73_m2} (ref >=60)
Globulin: 2.6 g/dL (calc) (ref 1.9–3.7)
Glucose, Bld: 88 mg/dL (ref 65–99)
Potassium: 3.9 mmol/L (ref 3.5–5.3)
Sodium: 140 mmol/L (ref 135–146)
Total Bilirubin: 0.4 mg/dL (ref 0.2–1.2)
Total Protein: 7 g/dL (ref 6.1–8.1)

## 2020-04-10 LAB — CBC WITH DIFFERENTIAL/PLATELET
Absolute Monocytes: 450 cells/uL (ref 200–950)
Basophils Absolute: 20 cells/uL (ref 0–200)
Basophils Relative: 0.4 %
Eosinophils Absolute: 50 cells/uL (ref 15–500)
Eosinophils Relative: 1 %
HCT: 39.6 % (ref 35.0–45.0)
Hemoglobin: 12.6 g/dL (ref 11.7–15.5)
Lymphs Abs: 2290 cells/uL (ref 850–3900)
MCH: 29.4 pg (ref 27.0–33.0)
MCHC: 31.8 g/dL — ABNORMAL LOW (ref 32.0–36.0)
MCV: 92.5 fL (ref 80.0–100.0)
MPV: 9.1 fL (ref 7.5–12.5)
Monocytes Relative: 9 %
Neutro Abs: 2190 cells/uL (ref 1500–7800)
Neutrophils Relative %: 43.8 %
Platelets: 288 10*3/uL (ref 140–400)
RBC: 4.28 10*6/uL (ref 3.80–5.10)
RDW: 12 % (ref 11.0–15.0)
Total Lymphocyte: 45.8 %
WBC: 5 10*3/uL (ref 3.8–10.8)

## 2020-04-10 NOTE — Patient Instructions (Signed)
Standing Labs We placed an order today for your standing lab work.   Please have your standing labs drawn in October  If possible, please have your labs drawn 2 weeks prior to your appointment so that the provider can discuss your results at your appointment.  We have open lab daily Monday through Thursday from 8:30-12:30 PM and 1:30-4:30 PM and Friday from 8:30-12:30 PM and 1:30-4:00 PM at the office of Dr. Bo Merino, [facility].   Please be advised, patients with office appointments requiring lab work will take precedents over walk-in lab work.  If possible, please come for your lab work on Monday and Friday afternoons, as you may experience shorter wait times. The office is located at [address] [place], [place], [place] [address] No appointment is necessary.   Labs are drawn by [facility]. Please bring your co-pay at the time of your lab draw.  You may receive a bill from [facility] for your lab work.  If you wish to have your labs drawn at another location, please call the office 24 hours in advance to send orders.  If you have any questions regarding directions or hours of operation,  please call [phone].   As a reminder, please drink plenty of water prior to coming for your lab work. Thanks!

## 2020-04-11 ENCOUNTER — Other Ambulatory Visit: Payer: Self-pay

## 2020-04-11 ENCOUNTER — Ambulatory Visit (HOSPITAL_COMMUNITY): Payer: 59 | Attending: Specialist | Admitting: Physical Therapy

## 2020-04-11 ENCOUNTER — Encounter (HOSPITAL_COMMUNITY): Payer: Self-pay | Admitting: Physical Therapy

## 2020-04-11 DIAGNOSIS — G8929 Other chronic pain: Secondary | ICD-10-CM | POA: Diagnosis present

## 2020-04-11 DIAGNOSIS — R262 Difficulty in walking, not elsewhere classified: Secondary | ICD-10-CM | POA: Diagnosis present

## 2020-04-11 DIAGNOSIS — M5442 Lumbago with sciatica, left side: Secondary | ICD-10-CM | POA: Diagnosis not present

## 2020-04-11 NOTE — Therapy (Signed)
[place], [place], [address] Phone: [phone]   Fax:  [phone]  Physical Therapy Treatment  Patient Details  Name: RONNEISHA JETT MRN: [id number] Date of Birth: Aug 29, 1956 Referring Provider (PT): Jessy Oto   Encounter Date: 04/11/2020   PT End of Session - 04/11/20 1155    Visit Number 2    Number of Visits 12    Date for PT Re-Evaluation 06/04/20    Authorization Type [employer], no auth, VL 60    Authorization - Visit Number 2    Authorization - Number of Visits 60    Progress Note Due on Visit 10    PT Start Time 1130    PT Stop Time 1210    PT Time Calculation (min) 40 min    Activity Tolerance Patient tolerated treatment well    Behavior During Therapy [facility] for tasks assessed/performed           Past Medical History:  Diagnosis Date  . Arthritis   . Fibromyalgia   . Macular degeneration    Left eye  . Osteopenia 05/2018   T score -2.3 FRAX 11% / 1.8% overall stable from prior DEXA  . Raynaud disease   . Reiters syndrome   . Sleep disturbance   . Vaginal dryness     Past Surgical History:  Procedure Laterality Date  . BREAST SURGERY     LEFT BR LUMP-Benign  . COLONOSCOPY N/A 08/04/2018   Procedure: COLONOSCOPY;  Surgeon: Danie Binder, MD;  [facility];  Service: Endoscopy;  Laterality: N/A;  8:30  . POLYPECTOMY  08/04/2018   Procedure: POLYPECTOMY;  Surgeon: Danie Binder, MD;  [facility];  Service: Endoscopy;;    There were no vitals filed for this visit.   Subjective Assessment - 04/11/20 1132    Subjective PT states that she really only has pain at night.  She has been doing her exercises that she was given two years ago in therapy.  Lying with flat with her legs out aggrevates her pain.    Pertinent History chronic low back pain, RA    Currently in Pain? Yes    Pain Score 2     Pain Location Back    Pain Orientation Lower    Pain  Descriptors / Indicators Throbbing    Pain Type Chronic pain    Pain Radiating Towards Lt LE    Pain Onset More than a month ago    Pain Frequency Intermittent    Aggravating Factors  bending , lifting    Pain Relieving Factors tylenol, heat and ice                             [facility] Adult PT Treatment/Exercise - 04/11/20 0001      Exercises   Exercises Lumbar      Lumbar Exercises: Stretches   Active Hamstring Stretch Right;Left;3 reps;30 seconds    Single Knee to Chest Stretch Right;Left;3 reps;30 seconds    Prone on Elbows Stretch 1 rep;60 seconds      Lumbar Exercises: Supine   Ab Set 10 reps    Bridge 5 reps    Other Supine Lumbar Exercises decompression ex 1-5                     PT Short Term Goals - 04/11/20 1158  PT SHORT TERM GOAL #1   Title Patient will be independent in self management strategies to improve quality of life and functional outcomes.    Time 4    Period Weeks    Status On-going    Target Date 05/07/20      PT SHORT TERM GOAL #2   Title Patient will report at least 25% improvement in overall symptoms and/or function to demonstrate improved functional mobility    Time 4    Period Weeks    Status On-going    Target Date 05/07/20      PT SHORT TERM GOAL #3   Title Patient to report she has been able to sleep through the night without waking due to pain in order to improve QOL     Time 3    Period Weeks    Status On-going      PT SHORT TERM GOAL #4   Title Patient to be independent in correctly and consistently performing targeted HEP, to be updated as appropriate     Period Weeks    Status On-going             PT Long Term Goals - 04/11/20 1159      PT LONG TERM GOAL #1   Title Patient will report at least 50% improvement in overall symptoms and/or function to demonstrate improved functional mobility    Time 8    Period Weeks    Status On-going      PT LONG TERM GOAL #2   Title Patietn will be  able to demonstrate painfree lumbar ROM.    Time 8    Period Weeks    Status On-going      PT LONG TERM GOAL #3   Title Patient will improve on FOTO score to meet predicted outcomes to demonstrate improved functional mobility.    Time 8    Period Weeks    Status On-going      PT LONG TERM GOAL #4   Title Patient to be participatory in regular aerobic exercise program, at least 3 days per week and 20 minutes in duration, in order to maintain functional gains and assist in preventing recurrence of symptoms     Period Weeks    Status On-going                 Plan - 04/11/20 1200    Clinical Impression Statement Evaluation and goals reviewed with patient.  Attempted manual traction but this caused increased pain in pt back and hip.  POE helps pain but did not attempt press up due to RA and wrist pain.  PT does state that if she sleeps on her stomach her pain is increased .   Added decompression, stretches and began stabilizatoin exercise with minimal cuing needed for proper technique.    Personal Factors and Comorbidities Comorbidity 1    Comorbidities RA, chronic low abck pain    Examination-Activity Limitations Bend;Lift;Locomotion Level;Sleep;Stairs;Bed Mobility    Examination-Participation Restrictions Yard Work;Cleaning    Stability/Clinical Decision Making Stable/Uncomplicated    Rehab Potential Good    PT Frequency --   12 visits over 8 week certification period   PT Duration 8 weeks    PT Treatment/Interventions ADLs/Self Care Home Management;Aquatic Therapy;Electrical Stimulation;Cryotherapy;Iontophoresis 4mg /ml Dexamethasone;Moist Heat;Traction;Balance training;Therapeutic exercise;Therapeutic activities;Stair training;Gait training;DME Instruction;Neuromuscular re-education;Patient/family education;Manual techniques;Dry needling;Passive range of motion;Joint Manipulations    PT Next Visit Plan assess repeated movements POE/lx flexion, , review yoga exercises patient is  doing per pt  request (if she remembers-wants to make sure she isn't hurting herself with exercises) core strengthening, glute strengthening    PT Home Exercise Plan 6/30 sciatic nerve glide L    Consulted and Agree with Plan of Care Patient           Patient will benefit from skilled therapeutic intervention in order to improve the following deficits and impairments:  Pain, Decreased strength, Decreased activity tolerance, Difficulty walking, Decreased range of motion  Visit Diagnosis: Chronic left-sided low back pain with left-sided sciatica  Difficulty in walking, not elsewhere classified     Problem List Patient Active Problem List   Diagnosis Date Noted  . Special screening for malignant neoplasms, colon   . High risk medication use 01/13/2017  . History of iritis 01/13/2017  . Essential hypertension 01/13/2017  . Macular degeneration 01/13/2017  . 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 Leg weakness 07/08/2014  . Difficulty walking 07/08/2014  . Vaginal dryness   . Reiter's syndrome ([place])   . Raynaud disease   . Fibromyalgia   . Inflammatory arthritis   . Osteopenia     Rayetta Humphrey, PT CLT [phone] 04/11/2020, 12:13 PM  [place] [address] [place], [place], [address] Phone: [phone]   Fax:  [phone]  Name: NAJMO PARDUE MRN: [id number] Date of Birth: 09-15-1956

## 2020-04-28 ENCOUNTER — Other Ambulatory Visit: Payer: Self-pay

## 2020-04-28 ENCOUNTER — Ambulatory Visit (HOSPITAL_COMMUNITY): Payer: 59

## 2020-04-28 ENCOUNTER — Encounter (HOSPITAL_COMMUNITY): Payer: Self-pay

## 2020-04-28 DIAGNOSIS — G8929 Other chronic pain: Secondary | ICD-10-CM

## 2020-04-28 DIAGNOSIS — R262 Difficulty in walking, not elsewhere classified: Secondary | ICD-10-CM

## 2020-04-28 DIAGNOSIS — M5442 Lumbago with sciatica, left side: Secondary | ICD-10-CM | POA: Diagnosis not present

## 2020-04-28 NOTE — Therapy (Signed)
[place], [place], [address] Phone: [phone]   Fax:  [phone]  Physical Therapy Treatment  Patient Details  Name: Monica [name] MRN: [id number] Date of Birth: 1955-11-15 Referring Provider (PT): Jessy Oto   Encounter Date: 04/28/2020   PT End of Session - 04/28/20 0824    Visit Number 3    Number of Visits 12    Date for PT Re-Evaluation 06/04/20    Authorization Type [employer], no auth, VL 60    Authorization - Visit Number 3    Authorization - Number of Visits 60    Progress Note Due on Visit 10    PT Start Time [phone]    PT Stop Time 0901    PT Time Calculation (min) 38 min    Activity Tolerance Patient tolerated treatment well;No increased pain    Behavior During Therapy WFL for tasks assessed/performed           Past Medical History:  Diagnosis Date  . Arthritis   . Fibromyalgia   . Macular degeneration    Left eye  . Osteopenia 05/2018   T score -2.3 FRAX 11% / 1.8% overall stable from prior DEXA  . Raynaud disease   . Reiters syndrome   . Sleep disturbance   . Vaginal dryness     Past Surgical History:  Procedure Laterality Date  . BREAST SURGERY     LEFT BR LUMP-Benign  . COLONOSCOPY N/A 08/04/2018   Procedure: COLONOSCOPY;  Surgeon: Danie Binder, MD;  [facility];  Service: Endoscopy;  Laterality: N/A;  8:30  . POLYPECTOMY  08/04/2018   Procedure: POLYPECTOMY;  Surgeon: Danie Binder, MD;  [facility];  Service: Endoscopy;;    There were no vitals filed for this visit.   Subjective Assessment - 04/28/20 0822    Subjective Pt reports she was very stiff last night and this morning. Pt reports 10/10 pain when she first woke up, but it is now 7/10. Pt reports went to beach last week and was sitting in beach chairs and playing with grandchildren so is a little sore.    Pertinent History chronic low back pain, RA    Currently in Pain? Yes      Pain Score 7     Pain Location Back    Pain Orientation Lower    Pain Descriptors / Indicators Aching;Sore    Pain Type Chronic pain    Pain Onset More than a month ago    Pain Frequency Intermittent    Aggravating Factors  bending and lifting    Pain Relieving Factors tylenol, heat and ice                  [facility] Adult PT Treatment/Exercise - 04/28/20 0001      Lumbar Exercises: Stretches   Lower Trunk Rotation 5 reps;10 seconds      Lumbar Exercises: Standing   Other Standing Lumbar Exercises standing hip abd, x10 reps; standing hip extension, diagonal, x10 reps      Lumbar Exercises: Supine   Ab Set 10 reps    AB Set Limitations with exhale, 5 sec isometric hold    Pelvic Tilt 15 reps;5 seconds    Pelvic Tilt Limitations posterior with cues    Glut Set 10 reps;3 seconds    Bridge 10 reps  PT Education - 04/28/20 0827    Education Details exercise technique, continue HEP, reviewed Yoga stretches/plank technique to reduce back/wrist pain and improve muscle acitvation with good carryover.    Person(s) Educated Patient    Methods Explanation;Demonstration;Tactile cues;Verbal cues    Comprehension Verbalized understanding;Returned demonstration            PT Short Term Goals - 04/11/20 1158      PT SHORT TERM GOAL #1   Title Patient will be independent in self management strategies to improve quality of life and functional outcomes.    Time 4    Period Weeks    Status On-going    Target Date 05/07/20      PT SHORT TERM GOAL #2   Title Patient will report at least 25% improvement in overall symptoms and/or function to demonstrate improved functional mobility    Time 4    Period Weeks    Status On-going    Target Date 05/07/20      PT SHORT TERM GOAL #3   Title Patient to report she has been able to sleep through the night without waking due to pain in order to improve QOL     Time 3    Period Weeks    Status On-going      PT  SHORT TERM GOAL #4   Title Patient to be independent in correctly and consistently performing targeted HEP, to be updated as appropriate     Period Weeks    Status On-going             PT Long Term Goals - 04/11/20 1159      PT LONG TERM GOAL #1   Title Patient will report at least 50% improvement in overall symptoms and/or function to demonstrate improved functional mobility    Time 8    Period Weeks    Status On-going      PT LONG TERM GOAL #2   Title Patietn will be able to demonstrate painfree lumbar ROM.    Time 8    Period Weeks    Status On-going      PT LONG TERM GOAL #3   Title Patient will improve on FOTO score to meet predicted outcomes to demonstrate improved functional mobility.    Time 8    Period Weeks    Status On-going      PT LONG TERM GOAL #4   Title Patient to be participatory in regular aerobic exercise program, at least 3 days per week and 20 minutes in duration, in order to maintain functional gains and assist in preventing recurrence of symptoms     Period Weeks    Status On-going                 Plan - 04/28/20 0827    Clinical Impression Statement Reviewed yoga exercises and stretches this session; therapist educated pt on core and glute activation to alleviate pain with exercise and improve stability while extremities are moving, also educated pt on modifications for planks to incline body and reduce strain on wrists and core. Continued to perform stabilizing core exercises to improve core and glute activation with proper technique and no pain. Pt reports improvement in pain from 7 to 4 at EOS due to reduction in stiffness. Continue to progress as able.    Personal Factors and Comorbidities Comorbidity 1    Comorbidities RA, chronic low abck pain    Examination-Activity Limitations Bend;Lift;Locomotion Level;Sleep;Stairs;Bed Mobility    Examination-Participation  Restrictions Yard Work;Cleaning    Stability/Clinical Decision Making  Stable/Uncomplicated    Rehab Potential Good    PT Frequency --   12 visits over 8 week certification period   PT Duration 8 weeks    PT Treatment/Interventions ADLs/Self Care Home Management;Aquatic Therapy;Electrical Stimulation;Cryotherapy;Iontophoresis 4mg /ml Dexamethasone;Moist Heat;Traction;Balance training;Therapeutic exercise;Therapeutic activities;Stair training;Gait training;DME Instruction;Neuromuscular re-education;Patient/family education;Manual techniques;Dry needling;Passive range of motion;Joint Manipulations    PT Next Visit Plan assess repeated movements POE/lx flexion, continue core strengthening and glute strengthening, with focus on stability.    PT Home Exercise Plan 6/30 sciatic nerve glide L; 7/19: isometric ab set with exhale    Consulted and Agree with Plan of Care Patient           Patient will benefit from skilled therapeutic intervention in order to improve the following deficits and impairments:  Pain, Decreased strength, Decreased activity tolerance, Difficulty walking, Decreased range of motion  Visit Diagnosis: Chronic left-sided low back pain with left-sided sciatica  Difficulty in walking, not elsewhere classified     Problem List Patient Active Problem List   Diagnosis Date Noted  . Special screening for malignant neoplasms, colon   . High risk medication use 01/13/2017  . History of iritis 01/13/2017  . Essential hypertension 01/13/2017  . Macular degeneration 01/13/2017  . 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 Leg weakness 07/08/2014  . Difficulty walking 07/08/2014  . Vaginal dryness   . Reiter's syndrome ([place])   . Raynaud disease   . Fibromyalgia   . Inflammatory arthritis   . Osteopenia      Talbot Grumbling PT, DPT 04/28/20, 11:37 AM [place] [address] [place], [place], [address] Phone: [phone]   Fax:   [phone]  Name: MITALI SHENEFIELD MRN: [id number] Date of Birth: Dec 06, 1955

## 2020-04-30 ENCOUNTER — Ambulatory Visit: Payer: 59 | Admitting: Specialist

## 2020-05-01 ENCOUNTER — Other Ambulatory Visit: Payer: Self-pay

## 2020-05-01 ENCOUNTER — Encounter (HOSPITAL_COMMUNITY): Payer: Self-pay

## 2020-05-01 ENCOUNTER — Ambulatory Visit (HOSPITAL_COMMUNITY): Payer: 59

## 2020-05-01 DIAGNOSIS — G8929 Other chronic pain: Secondary | ICD-10-CM

## 2020-05-01 DIAGNOSIS — R262 Difficulty in walking, not elsewhere classified: Secondary | ICD-10-CM

## 2020-05-01 DIAGNOSIS — M5442 Lumbago with sciatica, left side: Secondary | ICD-10-CM | POA: Diagnosis not present

## 2020-05-01 NOTE — Therapy (Signed)
[place], [place], [address] Phone: [phone]   Fax:  [phone]  Physical Therapy Treatment  Patient Details  Name: Monica [name] MRN: [id number] Date of Birth: 1956-07-29 Referring Provider (PT): Jessy Oto   Encounter Date: 05/01/2020   PT End of Session - 05/01/20 1041    Visit Number 4    Number of Visits 12    Date for PT Re-Evaluation 06/04/20    Authorization Type [employer], no auth, VL 60    Authorization - Visit Number 4    Authorization - Number of Visits 60    Progress Note Due on Visit 10    PT Start Time 7371    PT Stop Time 1115    PT Time Calculation (min) 40 min    Activity Tolerance Patient tolerated treatment well    Behavior During Therapy [facility] for tasks assessed/performed           Past Medical History:  Diagnosis Date  . Arthritis   . Fibromyalgia   . Macular degeneration    Left eye  . Osteopenia 05/2018   T score -2.3 FRAX 11% / 1.8% overall stable from prior DEXA  . Raynaud disease   . Reiters syndrome   . Sleep disturbance   . Vaginal dryness     Past Surgical History:  Procedure Laterality Date  . BREAST SURGERY     LEFT BR LUMP-Benign  . COLONOSCOPY N/A 08/04/2018   Procedure: COLONOSCOPY;  Surgeon: Danie Binder, MD;  [facility];  Service: Endoscopy;  Laterality: N/A;  8:30  . POLYPECTOMY  08/04/2018   Procedure: POLYPECTOMY;  Surgeon: Danie Binder, MD;  [facility];  Service: Endoscopy;;    There were no vitals filed for this visit.   Subjective Assessment - 05/01/20 1039    Subjective Pt reports sleeping horribly 2 nights ago, but slepy better last night so is feeling better. Pt reports 3/10 back pain right now.    Pertinent History chronic low back pain, RA    Currently in Pain? Yes    Pain Score 3     Pain Location Back    Pain Orientation Lower    Pain Descriptors / Indicators Aching;Sore    Pain Type  Chronic pain    Pain Onset More than a month ago    Pain Frequency Intermittent    Aggravating Factors  bending and liftin    Pain Relieving Factors tylenol, heat and ice                     [facility] Adult PT Treatment/Exercise - 05/01/20 0001      Lumbar Exercises: Standing   Other Standing Lumbar Exercises standing hip abd, x10 reps; standing hip extension, diagonal, x10 reps    Other Standing Lumbar Exercises paloff press with feet together, RTB, 10 reps each R and L      Lumbar Exercises: Seated   Hip Flexion on Ball Both;20 reps    Hip Flexion on Ball Limitations green ball    Other Seated Lumbar Exercises seated hip flexion seated on mat table, x10 reps; hip flexion seated on dynasic, x10 reps    Other Seated Lumbar Exercises LAQ on ball, 15 reps      Lumbar Exercises: Supine   Ab Set 20 reps    AB Set Limitations with exhale, 5 sec hold  PT Education - 05/01/20 1040    Education Details Updated HEP, exercise technique    Person(s) Educated Patient    Methods Explanation;Demonstration;Handout    Comprehension Verbalized understanding;Returned demonstration            PT Short Term Goals - 04/11/20 1158      PT SHORT TERM GOAL #1   Title Patient will be independent in self management strategies to improve quality of life and functional outcomes.    Time 4    Period Weeks    Status On-going    Target Date 05/07/20      PT SHORT TERM GOAL #2   Title Patient will report at least 25% improvement in overall symptoms and/or function to demonstrate improved functional mobility    Time 4    Period Weeks    Status On-going    Target Date 05/07/20      PT SHORT TERM GOAL #3   Title Patient to report she has been able to sleep through the night without waking due to pain in order to improve QOL     Time 3    Period Weeks    Status On-going      PT SHORT TERM GOAL #4   Title Patient to be independent in correctly and consistently  performing targeted HEP, to be updated as appropriate     Period Weeks    Status On-going             PT Long Term Goals - 04/11/20 1159      PT LONG TERM GOAL #1   Title Patient will report at least 50% improvement in overall symptoms and/or function to demonstrate improved functional mobility    Time 8    Period Weeks    Status On-going      PT LONG TERM GOAL #2   Title Patietn will be able to demonstrate painfree lumbar ROM.    Time 8    Period Weeks    Status On-going      PT LONG TERM GOAL #3   Title Patient will improve on FOTO score to meet predicted outcomes to demonstrate improved functional mobility.    Time 8    Period Weeks    Status On-going      PT LONG TERM GOAL #4   Title Patient to be participatory in regular aerobic exercise program, at least 3 days per week and 20 minutes in duration, in order to maintain functional gains and assist in preventing recurrence of symptoms     Period Weeks    Status On-going                 Plan - 05/01/20 1104    Clinical Impression Statement Pt with increased discomfort with supine core strengthening exercises so performed in sitting with good activation. Increased difficulty of core exercises with intermittent verbal cues for form. Pt tolerates paloff press with good core activation. Pt with good body awareness, able to perform without compensation and no increase pain. Updated HEP with higher level core exercises this session. Continue to progress as able.    Personal Factors and Comorbidities Comorbidity 1    Comorbidities RA, chronic low abck pain    Examination-Activity Limitations Bend;Lift;Locomotion Level;Sleep;Stairs;Bed Mobility    Examination-Participation Restrictions Yard Work;Cleaning    Stability/Clinical Decision Making Stable/Uncomplicated    Rehab Potential Good    PT Frequency --   12 visits over 8 week certification period   PT Duration 8 weeks  PT Treatment/Interventions ADLs/Self Care Home  Management;Aquatic Therapy;Electrical Stimulation;Cryotherapy;Iontophoresis 4mg /ml Dexamethasone;Moist Heat;Traction;Balance training;Therapeutic exercise;Therapeutic activities;Stair training;Gait training;DME Instruction;Neuromuscular re-education;Patient/family education;Manual techniques;Dry needling;Passive range of motion;Joint Manipulations    PT Next Visit Plan assess repeated movements POE/lx flexion, continue core strengthening and glute strengthening, with focus on stability.    PT Home Exercise Plan 6/30 sciatic nerve glide L; 7/19: isometric ab set with exhale; 7/22: standing hip abd, standing hip ext/diganol, swiss ball LE marching, swiss ball pelvic tilts    Consulted and Agree with Plan of Care Patient           Patient will benefit from skilled therapeutic intervention in order to improve the following deficits and impairments:  Pain, Decreased strength, Decreased activity tolerance, Difficulty walking, Decreased range of motion  Visit Diagnosis: Chronic left-sided low back pain with left-sided sciatica  Difficulty in walking, not elsewhere classified     Problem List Patient Active Problem List   Diagnosis Date Noted  . Special screening for malignant neoplasms, colon   . High risk medication use 01/13/2017  . History of iritis 01/13/2017  . Essential hypertension 01/13/2017  . Macular degeneration 01/13/2017  . 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 Leg weakness 07/08/2014  . Difficulty walking 07/08/2014  . Vaginal dryness   . Reiter's syndrome ([place])   . Raynaud disease   . Fibromyalgia   . Inflammatory arthritis   . Osteopenia      Talbot Grumbling PT, DPT 05/01/20, 12:22 PM [place] [address] [place], [place], [address] Phone: [phone]   Fax:  [phone]  Name: Monica [name] MRN: [id number] Date of Birth: 01-17-1956

## 2020-05-06 ENCOUNTER — Other Ambulatory Visit: Payer: Self-pay

## 2020-05-06 ENCOUNTER — Ambulatory Visit (HOSPITAL_COMMUNITY): Payer: 59 | Admitting: Physical Therapy

## 2020-05-06 DIAGNOSIS — G8929 Other chronic pain: Secondary | ICD-10-CM

## 2020-05-06 DIAGNOSIS — M5442 Lumbago with sciatica, left side: Secondary | ICD-10-CM | POA: Diagnosis not present

## 2020-05-06 DIAGNOSIS — R262 Difficulty in walking, not elsewhere classified: Secondary | ICD-10-CM

## 2020-05-06 NOTE — Therapy (Signed)
[place], [place], [address] Phone: [phone]   Fax:  [phone]  Physical Therapy Treatment  Patient Details  Name: Monica [name] MRN: [id number] Date of Birth: [birth year] Referring Provider (PT): Jessy Oto   Encounter Date: 05/06/2020   PT End of Session - 05/06/20 1226    Visit Number 5    Number of Visits 12    Date for PT Re-Evaluation 06/04/20    Authorization Type [employer], no auth, VL 60    Authorization - Visit Number 5    Authorization - Number of Visits 60    Progress Note Due on Visit 10    PT Start Time 1010    PT Stop Time 1044    PT Time Calculation (min) 34 min    Activity Tolerance Patient tolerated treatment well    Behavior During Therapy [facility] for tasks assessed/performed           Past Medical History:  Diagnosis Date  . Arthritis   . Fibromyalgia   . Macular degeneration    Left eye  . Osteopenia 05/2018   T score -2.3 FRAX 11% / 1.8% overall stable from prior DEXA  . Raynaud disease   . Reiters syndrome   . Sleep disturbance   . Vaginal dryness     Past Surgical History:  Procedure Laterality Date  . BREAST SURGERY     LEFT BR LUMP-Benign  . COLONOSCOPY N/A 08/04/2018   Procedure: COLONOSCOPY;  Surgeon: Danie Binder, MD;  [facility];  Service: Endoscopy;  Laterality: N/A;  8:30  . POLYPECTOMY  08/04/2018   Procedure: POLYPECTOMY;  Surgeon: Danie Binder, MD;  [facility];  Service: Endoscopy;;    There were no vitals filed for this visit.   Subjective Assessment - 05/06/20 1012    Subjective pt states she woke at 2am and took some pain meds and currently is 6/10.  States she has a dentist appt and will need to leave a little earlier today.    Currently in Pain? Yes    Pain Score 6     Pain Location Back                             [facility] Adult PT Treatment/Exercise - 05/06/20 0001      Lumbar  Exercises: Stretches   Prone on Elbows Stretch 60 seconds;1 rep      Lumbar Exercises: Standing   Other Standing Lumbar Exercises standing hip abd, 2x10 reps; standing hip extension 2X10, diagonal, 2x10 reps      Lumbar Exercises: Seated   Other Seated Lumbar Exercises seated hip flexion seated on mat table, x10 reps; hip flexion seated on dynasic, x10 reps    Other Seated Lumbar Exercises LAQ on ball, 15 reps      Lumbar Exercises: Supine   Pelvic Tilt 15 reps;5 seconds    Pelvic Tilt Limitations posterior with cues    Bridge 15 reps      Lumbar Exercises: Prone   Other Prone Lumbar Exercises press ups 5 reps    Other Prone Lumbar Exercises POE 2 minutes                    PT Short Term Goals - 04/11/20 1158      PT SHORT TERM GOAL #1   Title Patient will be independent in self management  strategies to improve quality of life and functional outcomes.    Time 4    Period Weeks    Status On-going    Target Date 05/07/20      PT SHORT TERM GOAL #2   Title Patient will report at least 25% improvement in overall symptoms and/or function to demonstrate improved functional mobility    Time 4    Period Weeks    Status On-going    Target Date 05/07/20      PT SHORT TERM GOAL #3   Title Patient to report she has been able to sleep through the night without waking due to pain in order to improve QOL     Time 3    Period Weeks    Status On-going      PT SHORT TERM GOAL #4   Title Patient to be independent in correctly and consistently performing targeted HEP, to be updated as appropriate     Period Weeks    Status On-going             PT Long Term Goals - 04/11/20 1159      PT LONG TERM GOAL #1   Title Patient will report at least 50% improvement in overall symptoms and/or function to demonstrate improved functional mobility    Time 8    Period Weeks    Status On-going      PT LONG TERM GOAL #2   Title Patietn will be able to demonstrate painfree lumbar  ROM.    Time 8    Period Weeks    Status On-going      PT LONG TERM GOAL #3   Title Patient will improve on FOTO score to meet predicted outcomes to demonstrate improved functional mobility.    Time 8    Period Weeks    Status On-going      PT LONG TERM GOAL #4   Title Patient to be participatory in regular aerobic exercise program, at least 3 days per week and 20 minutes in duration, in order to maintain functional gains and assist in preventing recurrence of symptoms     Period Weeks    Status On-going                 Plan - 05/06/20 1227    Clinical Impression Statement Pt with continued discomfort in lumbar region, however more comfort reported in supine this session so resumed LE and core strengthening in this position.  Press ups completed in prone, repeated did not change or increase LB symtpoms.  Pt with overall improving form and stability. Less cues with all exercises needed. No immediate change in symptoms at end of session today.    Personal Factors and Comorbidities Comorbidity 1    Comorbidities RA, chronic low abck pain    Examination-Activity Limitations Bend;Lift;Locomotion Level;Sleep;Stairs;Bed Mobility    Examination-Participation Restrictions Yard Work;Cleaning    Stability/Clinical Decision Making Stable/Uncomplicated    Rehab Potential Good    PT Frequency --   12 visits over 8 week certification period   PT Duration 8 weeks    PT Treatment/Interventions ADLs/Self Care Home Management;Aquatic Therapy;Electrical Stimulation;Cryotherapy;Iontophoresis 4mg /ml Dexamethasone;Moist Heat;Traction;Balance training;Therapeutic exercise;Therapeutic activities;Stair training;Gait training;DME Instruction;Neuromuscular re-education;Patient/family education;Manual techniques;Dry needling;Passive range of motion;Joint Manipulations    PT Next Visit Plan Continue core strengthening and glute strengthening, with focus on stability.    PT Home Exercise Plan 6/30 sciatic  nerve glide L; 7/19: isometric ab set with exhale; 7/22: standing hip abd, standing hip ext/diganol,  swiss ball LE marching, swiss ball pelvic tilts    Consulted and Agree with Plan of Care Patient           Patient will benefit from skilled therapeutic intervention in order to improve the following deficits and impairments:  Pain, Decreased strength, Decreased activity tolerance, Difficulty walking, Decreased range of motion  Visit Diagnosis: Difficulty in walking, not elsewhere classified  Chronic left-sided low back pain with left-sided sciatica     Problem List Patient Active Problem List   Diagnosis Date Noted  . Special screening for malignant neoplasms, colon   . High risk medication use 01/13/2017  . History of iritis 01/13/2017  . Essential hypertension 01/13/2017  . Macular degeneration 01/13/2017  . 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 Leg weakness 07/08/2014  . Difficulty walking 07/08/2014  . Vaginal dryness   . Reiter's syndrome ([place])   . Raynaud disease   . Fibromyalgia   . Inflammatory arthritis   . Osteopenia    Teena Irani, PTA/CLT [phone]  Teena Irani 05/06/2020, 12:31 PM  [place], [place], [address] Phone: [phone]   Fax:  [phone]  Name: Monica [name] MRN: [id number] Date of Birth: January 24, 1956

## 2020-05-08 ENCOUNTER — Ambulatory Visit (HOSPITAL_COMMUNITY): Payer: 59 | Admitting: Physical Therapy

## 2020-05-08 ENCOUNTER — Other Ambulatory Visit: Payer: Self-pay

## 2020-05-08 DIAGNOSIS — M5442 Lumbago with sciatica, left side: Secondary | ICD-10-CM

## 2020-05-08 DIAGNOSIS — R262 Difficulty in walking, not elsewhere classified: Secondary | ICD-10-CM

## 2020-05-08 NOTE — Therapy (Signed)
[place], [place], [address] Phone: [phone]   Fax:  [phone]  Physical Therapy Treatment  Patient Details  Name: Monica [name] MRN: [id number] Date of Birth: 13-May-1956 Referring Provider (PT): Jessy Oto   Encounter Date: 05/08/2020   PT End of Session - 05/08/20 1104    Visit Number 6    Number of Visits 12    Date for PT Re-Evaluation 06/04/20    Authorization Type [employer], no auth, VL 60    Authorization - Visit Number 6    Authorization - Number of Visits 60    Progress Note Due on Visit 10    PT Start Time 1005    PT Stop Time 1055    PT Time Calculation (min) 50 min    Activity Tolerance Patient tolerated treatment well    Behavior During Therapy [facility] for tasks assessed/performed           Past Medical History:  Diagnosis Date  . Arthritis   . Fibromyalgia   . Macular degeneration    Left eye  . Osteopenia 05/2018   T score -2.3 FRAX 11% / 1.8% overall stable from prior DEXA  . Raynaud disease   . Reiters syndrome   . Sleep disturbance   . Vaginal dryness     Past Surgical History:  Procedure Laterality Date  . BREAST SURGERY     LEFT BR LUMP-Benign  . COLONOSCOPY N/A 08/04/2018   Procedure: COLONOSCOPY;  Surgeon: Danie Binder, MD;  [facility];  Service: Endoscopy;  Laterality: N/A;  8:30  . POLYPECTOMY  08/04/2018   Procedure: POLYPECTOMY;  Surgeon: Danie Binder, MD;  [facility];  Service: Endoscopy;;    There were no vitals filed for this visit.   Subjective Assessment - 05/08/20 1010    Subjective pt states she uses voltarin gel and heat to help combat her pain in addition to meds.  States she woke at 3:45 and had to take meds.  continues to hurt at 6/10    Currently in Pain? Yes    Pain Score 6     Pain Location Back    Pain Orientation Lower    Pain Descriptors / Indicators Aching;Sore                              [facility] Adult PT Treatment/Exercise - 05/08/20 0001      Lumbar Exercises: Standing   Functional Squats 10 reps    Forward Lunge 15 reps;Limitations    Forward Lunge Limitations foot onto 6" without UE assist working on stability    Other Standing Lumbar Exercises standing hip abd, 2x10 reps; standing hip extension 2X10, diagonal, 2x10 reps      Lumbar Exercises: Seated   Other Seated Lumbar Exercises LAQ on ball, 15 reps      Lumbar Exercises: Supine   Pelvic Tilt 15 reps;5 seconds    Pelvic Tilt Limitations posterior with cues    Bridge 15 reps      Lumbar Exercises: Sidelying   Hip Abduction 10 reps      Lumbar Exercises: Prone   Single Arm Raise 10 reps    Straight Leg Raise 10 reps    Opposite Arm/Leg Raise 10 reps      Manual Therapy   Manual Therapy Soft tissue mobilization    Manual therapy comments separate rest of  session    Soft tissue mobilization Paraspinals and Lt piriformis                    PT Short Term Goals - 04/11/20 1158      PT SHORT TERM GOAL #1   Title Patient will be independent in self management strategies to improve quality of life and functional outcomes.    Time 4    Period Weeks    Status On-going    Target Date 05/07/20      PT SHORT TERM GOAL #2   Title Patient will report at least 25% improvement in overall symptoms and/or function to demonstrate improved functional mobility    Time 4    Period Weeks    Status On-going    Target Date 05/07/20      PT SHORT TERM GOAL #3   Title Patient to report she has been able to sleep through the night without waking due to pain in order to improve QOL     Time 3    Period Weeks    Status On-going      PT SHORT TERM GOAL #4   Title Patient to be independent in correctly and consistently performing targeted HEP, to be updated as appropriate     Period Weeks    Status On-going             PT Long Term Goals - 04/11/20 1159      PT LONG  TERM GOAL #1   Title Patient will report at least 50% improvement in overall symptoms and/or function to demonstrate improved functional mobility    Time 8    Period Weeks    Status On-going      PT LONG TERM GOAL #2   Title Patietn will be able to demonstrate painfree lumbar ROM.    Time 8    Period Weeks    Status On-going      PT LONG TERM GOAL #3   Title Patient will improve on FOTO score to meet predicted outcomes to demonstrate improved functional mobility.    Time 8    Period Weeks    Status On-going      PT LONG TERM GOAL #4   Title Patient to be participatory in regular aerobic exercise program, at least 3 days per week and 20 minutes in duration, in order to maintain functional gains and assist in preventing recurrence of symptoms     Period Weeks    Status On-going                 Plan - 05/08/20 1105    Clinical Impression Statement Continued with established POC with addition of forward lunges/squats to work on stability.  Pt with cues to complete in correct form, however no pain or issues completing.  Added prone stab exercises and completed manual work to lumbar/glute region to help reduce discomfort.  No spasms palpated or areas of great discomfort noted.  Overall improvement at end of session today.    Personal Factors and Comorbidities Comorbidity 1    Comorbidities RA, chronic low abck pain    Examination-Activity Limitations Bend;Lift;Locomotion Level;Sleep;Stairs;Bed Mobility    Examination-Participation Restrictions Yard Work;Cleaning    Stability/Clinical Decision Making Stable/Uncomplicated    Rehab Potential Good    PT Frequency --   12 visits over 8 week certification period   PT Duration 8 weeks    PT Treatment/Interventions ADLs/Self Care Home Management;Aquatic Therapy;Electrical Stimulation;Cryotherapy;Iontophoresis 4mg /ml Dexamethasone;Moist Heat;Traction;Balance  training;Therapeutic exercise;Therapeutic activities;Stair training;Gait  training;DME Instruction;Neuromuscular re-education;Patient/family education;Manual techniques;Dry needling;Passive range of motion;Joint Manipulations    PT Next Visit Plan Continue core strengthening and glute strengthening, with focus on stability.    PT Home Exercise Plan 6/30 sciatic nerve glide L; 7/19: isometric ab set with exhale; 7/22: standing hip abd, standing hip ext/diganol, swiss ball LE marching, swiss ball pelvic tilts    Consulted and Agree with Plan of Care Patient           Patient will benefit from skilled therapeutic intervention in order to improve the following deficits and impairments:  Pain, Decreased strength, Decreased activity tolerance, Difficulty walking, Decreased range of motion  Visit Diagnosis: Difficulty in walking, not elsewhere classified  Chronic left-sided low back pain with left-sided sciatica     Problem List Patient Active Problem List   Diagnosis Date Noted  . Special screening for malignant neoplasms, colon   . High risk medication use 01/13/2017  . History of iritis 01/13/2017  . Essential hypertension 01/13/2017  . Macular degeneration 01/13/2017  . 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 Leg weakness 07/08/2014  . Difficulty walking 07/08/2014  . Vaginal dryness   . Reiter's syndrome ([place])   . Raynaud disease   . Fibromyalgia   . Inflammatory arthritis   . Osteopenia    Teena Irani, PTA/CLT [phone]  Teena Irani 05/08/2020, 11:07 AM  [place] [address] [place], [place], [address] Phone: [phone]   Fax:  [phone]  Name: THARA SEARING MRN: [id number] Date of Birth: 1955-12-10

## 2020-05-13 ENCOUNTER — Ambulatory Visit (HOSPITAL_COMMUNITY): Payer: 59 | Attending: Specialist

## 2020-05-13 ENCOUNTER — Encounter (HOSPITAL_COMMUNITY): Payer: Self-pay

## 2020-05-13 ENCOUNTER — Other Ambulatory Visit: Payer: Self-pay

## 2020-05-13 DIAGNOSIS — R262 Difficulty in walking, not elsewhere classified: Secondary | ICD-10-CM | POA: Insufficient documentation

## 2020-05-13 DIAGNOSIS — M5442 Lumbago with sciatica, left side: Secondary | ICD-10-CM | POA: Diagnosis present

## 2020-05-13 DIAGNOSIS — G8929 Other chronic pain: Secondary | ICD-10-CM | POA: Insufficient documentation

## 2020-05-13 NOTE — Therapy (Signed)
[place], [place], [address] Phone: [phone]   Fax:  [phone]  Physical Therapy Treatment  Patient Details  Name: Monica [name] MRN: [id number] Date of Birth: [birth year] Referring Provider (PT): Jessy Oto   Encounter Date: 05/13/2020   PT End of Session - 05/13/20 1340    Visit Number 7    Number of Visits 12    Date for PT Re-Evaluation 06/04/20    Authorization Type [employer], no auth, VL 60    Authorization - Visit Number 7    Authorization - Number of Visits 60    Progress Note Due on Visit 10    PT Start Time 0623    PT Stop Time 1400    PT Time Calculation (min) 47 min    Activity Tolerance Patient tolerated treatment well    Behavior During Therapy [facility] for tasks assessed/performed           Past Medical History:  Diagnosis Date  . Arthritis   . Fibromyalgia   . Macular degeneration    Left eye  . Osteopenia 05/2018   T score -2.3 FRAX 11% / 1.8% overall stable from prior DEXA  . Raynaud disease   . Reiters syndrome   . Sleep disturbance   . Vaginal dryness     Past Surgical History:  Procedure Laterality Date  . BREAST SURGERY     LEFT BR LUMP-Benign  . COLONOSCOPY N/A 08/04/2018   Procedure: COLONOSCOPY;  Surgeon: Danie Binder, MD;  [facility];  Service: Endoscopy;  Laterality: N/A;  8:30  . POLYPECTOMY  08/04/2018   Procedure: POLYPECTOMY;  Surgeon: Danie Binder, MD;  [facility];  Service: Endoscopy;;    There were no vitals filed for this visit.   Subjective Assessment - 05/13/20 1316    Subjective Reports increased radicular symptoms following Thursday posterior thigh then lateral around to calf, reports feels better today.  Has been compliant wiht HEP.    Pertinent History chronic low back pain, RA    Currently in Pain? Yes    Pain Score 6     Pain Location Back    Pain Orientation Lower;Left    Pain Type Chronic pain     Pain Radiating Towards Lt LE    Pain Onset More than a month ago    Pain Frequency Intermittent    Aggravating Factors  bending and lifting    Pain Relieving Factors tylenol, heat and ice                             [facility] Adult PT Treatment/Exercise - 05/13/20 0001      Lumbar Exercises: Stretches   Active Hamstring Stretch 2 reps;30 seconds    Active Hamstring Stretch Limitations supine wiht hands behind thigh    Figure 4 Stretch 2 reps;30 seconds    Figure 4 Stretch Limitations supine     Other Lumbar Stretch Exercise Child pose 2x 30"      Lumbar Exercises: Standing   Functional Squats 10 reps    Other Standing Lumbar Exercises paloff press with feet together, RTB, 10 reps each R and L      Lumbar Exercises: Supine   Pelvic Tilt 15 reps;5 seconds    Pelvic Tilt Limitations posterior with cues    Bridge 15 reps      Lumbar Exercises: Quadruped   Single  Arm Raise Right;Left;10 reps;5 seconds    Straight Leg Raise 5 reps;5 seconds      Manual Therapy   Manual Therapy Soft tissue mobilization    Manual therapy comments separate rest of session    Soft tissue mobilization Paraspinals and Lt piriformis                    PT Short Term Goals - 04/11/20 1158      PT SHORT TERM GOAL #1   Title Patient will be independent in self management strategies to improve quality of life and functional outcomes.    Time 4    Period Weeks    Status On-going    Target Date 05/07/20      PT SHORT TERM GOAL #2   Title Patient will report at least 25% improvement in overall symptoms and/or function to demonstrate improved functional mobility    Time 4    Period Weeks    Status On-going    Target Date 05/07/20      PT SHORT TERM GOAL #3   Title Patient to report she has been able to sleep through the night without waking due to pain in order to improve QOL     Time 3    Period Weeks    Status On-going      PT SHORT TERM GOAL #4   Title Patient to be  independent in correctly and consistently performing targeted HEP, to be updated as appropriate     Period Weeks    Status On-going             PT Long Term Goals - 04/11/20 1159      PT LONG TERM GOAL #1   Title Patient will report at least 50% improvement in overall symptoms and/or function to demonstrate improved functional mobility    Time 8    Period Weeks    Status On-going      PT LONG TERM GOAL #2   Title Patietn will be able to demonstrate painfree lumbar ROM.    Time 8    Period Weeks    Status On-going      PT LONG TERM GOAL #3   Title Patient will improve on FOTO score to meet predicted outcomes to demonstrate improved functional mobility.    Time 8    Period Weeks    Status On-going      PT LONG TERM GOAL #4   Title Patient to be participatory in regular aerobic exercise program, at least 3 days per week and 20 minutes in duration, in order to maintain functional gains and assist in preventing recurrence of symptoms     Period Weeks    Status On-going                 Plan - 05/13/20 1342    Clinical Impression Statement Sessoin focus with core stability.  Progressed to quadruped stability exercises with education on importance of core activation to reduce lumbar extension with task.  Added pillow under hips for increased comfort in prone position during massage.  Included stretches to address tightness to assist with reduction of radicular symptoms.  No reoprts of increased pain through session.    Personal Factors and Comorbidities Comorbidity 1    Comorbidities RA, chronic low abck pain    Examination-Activity Limitations Bend;Lift;Locomotion Level;Sleep;Stairs;Bed Mobility    Examination-Participation Restrictions Yard Work;Cleaning    Stability/Clinical Decision Making Stable/Uncomplicated    Clinical Decision Making Low  Rehab Potential Good    PT Duration 8 weeks    PT Treatment/Interventions ADLs/Self Care Home Management;Aquatic  Therapy;Electrical Stimulation;Cryotherapy;Iontophoresis 4mg /ml Dexamethasone;Moist Heat;Traction;Balance training;Therapeutic exercise;Therapeutic activities;Stair training;Gait training;DME Instruction;Neuromuscular re-education;Patient/family education;Manual techniques;Dry needling;Passive range of motion;Joint Manipulations    PT Next Visit Plan Continue core strengthening and glute strengthening, with focus on stability.    PT Home Exercise Plan 6/30 sciatic nerve glide L; 7/19: isometric ab set with exhale; 7/22: standing hip abd, standing hip ext/diganol, swiss ball LE marching, swiss ball pelvic tilts           Patient will benefit from skilled therapeutic intervention in order to improve the following deficits and impairments:  Pain, Decreased strength, Decreased activity tolerance, Difficulty walking, Decreased range of motion  Visit Diagnosis: Chronic left-sided low back pain with left-sided sciatica  Difficulty in walking, not elsewhere classified     Problem List Patient Active Problem List   Diagnosis Date Noted  . Special screening for malignant neoplasms, colon   . High risk medication use 01/13/2017  . History of iritis 01/13/2017  . Essential hypertension 01/13/2017  . Macular degeneration 01/13/2017  . 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 Leg weakness 07/08/2014  . Difficulty walking 07/08/2014  . Vaginal dryness   . Reiter's syndrome ([place])   . Raynaud disease   . Fibromyalgia   . Inflammatory arthritis   . Osteopenia    Ihor Austin, LPTA/CLT; CBIS [phone]  Aldona Lento 05/13/2020, 6:49 PM  [place] [address] [place], [place], [address] Phone: [phone]   Fax:  [phone]  Name: Monica [name] MRN: [id number] Date of Birth: 05-18-1956

## 2020-05-15 ENCOUNTER — Encounter (HOSPITAL_COMMUNITY): Payer: Self-pay | Admitting: Physical Therapy

## 2020-05-15 ENCOUNTER — Ambulatory Visit (HOSPITAL_COMMUNITY): Payer: 59 | Admitting: Physical Therapy

## 2020-05-15 ENCOUNTER — Other Ambulatory Visit: Payer: Self-pay

## 2020-05-15 DIAGNOSIS — M5442 Lumbago with sciatica, left side: Secondary | ICD-10-CM

## 2020-05-15 DIAGNOSIS — G8929 Other chronic pain: Secondary | ICD-10-CM

## 2020-05-15 DIAGNOSIS — R262 Difficulty in walking, not elsewhere classified: Secondary | ICD-10-CM

## 2020-05-15 NOTE — Therapy (Signed)
[place], [place], [address] Phone: [phone]   Fax:  [phone]  Physical Therapy Treatment  Patient Details  Name: Monica [name] MRN: [id number] Date of Birth: July 26, 1956 Referring Provider (PT): Jessy Oto   Encounter Date: 05/15/2020   PT End of Session - 05/15/20 1449    Visit Number 8    Number of Visits 12    Date for PT Re-Evaluation 06/04/20    Authorization Type [employer], no auth, VL 60    Authorization - Visit Number 8    Authorization - Number of Visits 60    Progress Note Due on Visit 10    PT Start Time 8099    PT Stop Time 1529    PT Time Calculation (min) 40 min    Activity Tolerance Patient tolerated treatment well    Behavior During Therapy [facility] for tasks assessed/performed           Past Medical History:  Diagnosis Date  . Arthritis   . Fibromyalgia   . Macular degeneration    Left eye  . Osteopenia 05/2018   T score -2.3 FRAX 11% / 1.8% overall stable from prior DEXA  . Raynaud disease   . Reiters syndrome   . Sleep disturbance   . Vaginal dryness     Past Surgical History:  Procedure Laterality Date  . BREAST SURGERY     LEFT BR LUMP-Benign  . COLONOSCOPY N/A 08/04/2018   Procedure: COLONOSCOPY;  Surgeon: Danie Binder, MD;  [facility];  Service: Endoscopy;  Laterality: N/A;  8:30  . POLYPECTOMY  08/04/2018   Procedure: POLYPECTOMY;  Surgeon: Danie Binder, MD;  [facility];  Service: Endoscopy;;    There were no vitals filed for this visit.   Subjective Assessment - 05/15/20 1550    Subjective States she had a bad week last week. States that her prone leg lift exercise was very painful on her left side and she felt that that set her back. States that she has good and bad days and sometimes catches herself sleeping on her stomach. States she wants to be about 50% better but doesn't think she is.  States that all of her  exercises seem to help. State that the pain is 6/10 and is in the same spot down the left leg and tingling.    Pertinent History chronic low back pain, RA    Currently in Pain? Yes    Pain Score 6     Pain Location Back    Pain Orientation Left;Lower    Pain Descriptors / Indicators Aching;Sore;Tingling    Pain Onset More than a month ago              [facility] PT Assessment - 05/15/20 0001      Assessment   Medical Diagnosis DDD LBP    Referring Provider (PT) Jessy Oto                         [facility] Adult PT Treatment/Exercise - 05/15/20 0001      Lumbar Exercises: Supine   Other Supine Lumbar Exercises 5/5 breath work - 2 minutes worth       Lumbar Exercises: Prone   Other Prone Lumbar Exercises femoral nerve glide- 5 minutes total - gentle      Manual Therapy   Manual Therapy Soft tissue mobilization;Joint mobilization    Manual  therapy comments separate rest of session    Joint Mobilization PA to left hip grade II/III - reduction in symptoms noted afterwards; PAS to lumbar spine grade II/III. UPA to sacral border lateral - B grade II/III     Soft tissue mobilization Left psoas TPR and STM - tolerated moderately well with reproduction of tingling symptoms in her foot.  STM to piriformis and glute med -L - reproduction of symptoms noted with left piriformis.                   PT Education - 05/15/20 1550    Education Details on current condition, psoas anatomy, on rationale for nerve stretching and how to perform    Person(s) Educated Patient    Methods Explanation    Comprehension Verbalized understanding            PT Short Term Goals - 04/11/20 1158      PT SHORT TERM GOAL #1   Title Patient will be independent in self management strategies to improve quality of life and functional outcomes.    Time 4    Period Weeks    Status On-going    Target Date 05/07/20      PT SHORT TERM GOAL #2   Title Patient will report at least 25%  improvement in overall symptoms and/or function to demonstrate improved functional mobility    Time 4    Period Weeks    Status On-going    Target Date 05/07/20      PT SHORT TERM GOAL #3   Title Patient to report she has been able to sleep through the night without waking due to pain in order to improve QOL     Time 3    Period Weeks    Status On-going      PT SHORT TERM GOAL #4   Title Patient to be independent in correctly and consistently performing targeted HEP, to be updated as appropriate     Period Weeks    Status On-going             PT Long Term Goals - 04/11/20 1159      PT LONG TERM GOAL #1   Title Patient will report at least 50% improvement in overall symptoms and/or function to demonstrate improved functional mobility    Time 8    Period Weeks    Status On-going      PT LONG TERM GOAL #2   Title Patietn will be able to demonstrate painfree lumbar ROM.    Time 8    Period Weeks    Status On-going      PT LONG TERM GOAL #3   Title Patient will improve on FOTO score to meet predicted outcomes to demonstrate improved functional mobility.    Time 8    Period Weeks    Status On-going      PT LONG TERM GOAL #4   Title Patient to be participatory in regular aerobic exercise program, at least 3 days per week and 20 minutes in duration, in order to maintain functional gains and assist in preventing recurrence of symptoms     Period Weeks    Status On-going                 Plan - 05/15/20 1558    Clinical Impression Statement Performed FOTO and regression in function noted. Discussed lack of progress with patient. Continued to assess lumbar spine, hip and sacral region. Limited mobility noted  in lumbar spine and in left hip joint. Neural tension noted along femoral nerve and patient responded well to gentle nerve glide. Will continue to reassess as needed and perform mobilizations. Will assess response to home exercise of nerve glide next session.     Personal Factors and Comorbidities Comorbidity 1    Comorbidities RA, chronic low back pain    Examination-Activity Limitations Bend;Lift;Locomotion Level;Sleep;Stairs;Bed Mobility    Examination-Participation Restrictions Yard Work;Cleaning    Stability/Clinical Decision Making Stable/Uncomplicated    Rehab Potential Good    PT Duration 8 weeks    PT Treatment/Interventions ADLs/Self Care Home Management;Aquatic Therapy;Electrical Stimulation;Cryotherapy;Iontophoresis 4mg /ml Dexamethasone;Moist Heat;Traction;Balance training;Therapeutic exercise;Therapeutic activities;Stair training;Gait training;DME Instruction;Neuromuscular re-education;Patient/family education;Manual techniques;Dry needling;Passive range of motion;Joint Manipulations    PT Next Visit Plan f/u with femoral nerve glide, lumbar and hip mobilizations, STM to hip External rotators, breath work for decrease in intra-abdominal pressure, psoas release- trial AP of lumbar spine    PT Home Exercise Plan 6/30 sciatic nerve glide L; 7/19: isometric ab set with exhale; 7/22: standing hip abd, standing hip ext/diganol, swiss ball LE marching, swiss ball pelvic tilts           Patient will benefit from skilled therapeutic intervention in order to improve the following deficits and impairments:  Pain, Decreased strength, Decreased activity tolerance, Difficulty walking, Decreased range of motion  Visit Diagnosis: Chronic left-sided low back pain with left-sided sciatica  Difficulty in walking, not elsewhere classified     Problem List Patient Active Problem List   Diagnosis Date Noted  . Special screening for malignant neoplasms, colon   . High risk medication use 01/13/2017  . History of iritis 01/13/2017  . Essential hypertension 01/13/2017  . Macular degeneration 01/13/2017  . 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 Leg weakness 07/08/2014  . Difficulty walking 07/08/2014  . Vaginal  dryness   . Reiter's syndrome ([place])   . Raynaud disease   . Fibromyalgia   . Inflammatory arthritis   . Osteopenia    4:00 PM, 05/15/20 Jerene Pitch, DPT Physical Therapy with [facility]  [phone] office  [place] [address] [place], [place], [address] Phone: [phone]   Fax:  [phone]  Name: Monica [name] MRN: [id number] Date of Birth: 04-23-1956

## 2020-05-20 ENCOUNTER — Encounter (HOSPITAL_COMMUNITY): Payer: Self-pay | Admitting: Physical Therapy

## 2020-05-20 ENCOUNTER — Other Ambulatory Visit: Payer: Self-pay

## 2020-05-20 ENCOUNTER — Ambulatory Visit (HOSPITAL_COMMUNITY): Payer: 59 | Admitting: Physical Therapy

## 2020-05-20 DIAGNOSIS — R262 Difficulty in walking, not elsewhere classified: Secondary | ICD-10-CM

## 2020-05-20 DIAGNOSIS — M5442 Lumbago with sciatica, left side: Secondary | ICD-10-CM | POA: Diagnosis not present

## 2020-05-20 DIAGNOSIS — G8929 Other chronic pain: Secondary | ICD-10-CM

## 2020-05-20 NOTE — Therapy (Signed)
[place], [place], [address] Phone: [phone]   Fax:  [phone]  Physical Therapy Treatment  Patient Details  Name: Monica [name] MRN: [id number] Date of Birth: 08-07-1956 Referring Provider (PT): Jessy Oto   Encounter Date: 05/20/2020   PT End of Session - 05/20/20 1610    Visit Number 9    Number of Visits 12    Date for PT Re-Evaluation 06/04/20    Authorization Type [employer], no auth, VL 60    Authorization - Visit Number 9    Authorization - Number of Visits 60    Progress Note Due on Visit 10    PT Start Time 1610    PT Stop Time 1650    PT Time Calculation (min) 40 min    Activity Tolerance Patient tolerated treatment well    Behavior During Therapy [facility] for tasks assessed/performed           Past Medical History:  Diagnosis Date  . Arthritis   . Fibromyalgia   . Macular degeneration    Left eye  . Osteopenia 05/2018   T score -2.3 FRAX 11% / 1.8% overall stable from prior DEXA  . Raynaud disease   . Reiters syndrome   . Sleep disturbance   . Vaginal dryness     Past Surgical History:  Procedure Laterality Date  . BREAST SURGERY     LEFT BR LUMP-Benign  . COLONOSCOPY N/A 08/04/2018   Procedure: COLONOSCOPY;  Surgeon: Danie Binder, MD;  [facility];  Service: Endoscopy;  Laterality: N/A;  8:30  . POLYPECTOMY  08/04/2018   Procedure: POLYPECTOMY;  Surgeon: Danie Binder, MD;  [facility];  Service: Endoscopy;;    There were no vitals filed for this visit.   Subjective Assessment - 05/20/20 1616    Subjective States after last session she felt better until that night when she woke up in pain and had to take Tylenol (states that it is usually around 2-4pm). States that she then tried leaning over in the chest the other day and that really bothered her. States right now she is about a 6/10.    Pertinent History chronic low back pain, RA     Currently in Pain? Yes    Pain Score 6     Pain Location Back    Pain Onset More than a month ago              [facility] PT Assessment - 05/20/20 0001      Assessment   Medical Diagnosis DDD LBP    Referring Provider (PT) Jessy Oto                         [facility] Adult PT Treatment/Exercise - 05/20/20 0001      Lumbar Exercises: Standing   Other Standing Lumbar Exercises lumbar extension at wall - x15 10" holds - hands on low back       Lumbar Exercises: Supine   Other Supine Lumbar Exercises forceful breathing out - with tactile cues - tolerated well       Lumbar Exercises: Prone   Other Prone Lumbar Exercises deep breathing and long exhale out x10 , repeated prone on elbows - x30 - PT overpressure; pelvic rocking lateral     Other Prone Lumbar Exercises femoral nerve glide- gentle - reassessed       Manual Therapy  Manual Therapy Soft tissue mobilization;Joint mobilization    Manual therapy comments separate rest of session    Joint Mobilization CPA and medial glides to L  3-L5 grade II/III, PA to sacrum at base - tolerated well     Soft tissue mobilization STM to lumbar paraspinals, glute meds and pirformis L                     PT Short Term Goals - 04/11/20 1158      PT SHORT TERM GOAL #1   Title Patient will be independent in self management strategies to improve quality of life and functional outcomes.    Time 4    Period Weeks    Status On-going    Target Date 05/07/20      PT SHORT TERM GOAL #2   Title Patient will report at least 25% improvement in overall symptoms and/or function to demonstrate improved functional mobility    Time 4    Period Weeks    Status On-going    Target Date 05/07/20      PT SHORT TERM GOAL #3   Title Patient to report she has been able to sleep through the night without waking due to pain in order to improve QOL     Time 3    Period Weeks    Status On-going      PT SHORT TERM GOAL #4   Title  Patient to be independent in correctly and consistently performing targeted HEP, to be updated as appropriate     Period Weeks    Status On-going             PT Long Term Goals - 04/11/20 1159      PT LONG TERM GOAL #1   Title Patient will report at least 50% improvement in overall symptoms and/or function to demonstrate improved functional mobility    Time 8    Period Weeks    Status On-going      PT LONG TERM GOAL #2   Title Patietn will be able to demonstrate painfree lumbar ROM.    Time 8    Period Weeks    Status On-going      PT LONG TERM GOAL #3   Title Patient will improve on FOTO score to meet predicted outcomes to demonstrate improved functional mobility.    Time 8    Period Weeks    Status On-going      PT LONG TERM GOAL #4   Title Patient to be participatory in regular aerobic exercise program, at least 3 days per week and 20 minutes in duration, in order to maintain functional gains and assist in preventing recurrence of symptoms     Period Weeks    Status On-going                 Plan - 05/20/20 1610    Clinical Impression Statement difficult to get response with interventions. Difficult for patient to describe symptoms. Instructed patient to take log of symptoms for better response to treatment. Patient seemed to tolerate lumbar extension but then symptoms worsened. Tolerated core work well. will follow up with that next session and look into APs of lumbar spine if needed.    Personal Factors and Comorbidities Comorbidity 1    Comorbidities RA, chronic low back pain    Examination-Activity Limitations Bend;Lift;Locomotion Level;Sleep;Stairs;Bed Mobility    Examination-Participation Restrictions Yard Work;Cleaning    Stability/Clinical Decision Making Stable/Uncomplicated    Rehab  Potential Good    PT Duration 8 weeks    PT Treatment/Interventions ADLs/Self Care Home Management;Aquatic Therapy;Electrical Stimulation;Cryotherapy;Iontophoresis 4mg /ml  Dexamethasone;Moist Heat;Traction;Balance training;Therapeutic exercise;Therapeutic activities;Stair training;Gait training;DME Instruction;Neuromuscular re-education;Patient/family education;Manual techniques;Dry needling;Passive range of motion;Joint Manipulations    PT Next Visit Plan f/u with femoral nerve glide, lumbar and hip mobilizations, STM to hip External rotators, breath work for decrease in intra-abdominal pressure, psoas release- trial AP of lumbar spine    PT Home Exercise Plan 6/30 sciatic nerve glide L; 7/19: isometric ab set with exhale; 7/22: standing hip abd, standing hip ext/diganol, swiss ball LE marching, swiss ball pelvic tilts; 8/10 femoral nerve stretch, upper TRA activation           Patient will benefit from skilled therapeutic intervention in order to improve the following deficits and impairments:  Pain, Decreased strength, Decreased activity tolerance, Difficulty walking, Decreased range of motion  Visit Diagnosis: Chronic left-sided low back pain with left-sided sciatica  Difficulty in walking, not elsewhere classified     Problem List Patient Active Problem List   Diagnosis Date Noted  . Special screening for malignant neoplasms, colon   . High risk medication use 01/13/2017  . History of iritis 01/13/2017  . Essential hypertension 01/13/2017  . Macular degeneration 01/13/2017  . 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 Leg weakness 07/08/2014  . Difficulty walking 07/08/2014  . Vaginal dryness   . Reiter's syndrome ([place])   . Raynaud disease   . Fibromyalgia   . Inflammatory arthritis   . Osteopenia    5:03 PM, 05/20/20 Jerene Pitch, DPT Physical Therapy with [facility]  [phone] office  [place] [address] [place], [place], [address] Phone: [phone]   Fax:  [phone]  Name: KALIEGH WILLADSEN MRN: [id number] Date of  Birth: 1956/09/18

## 2020-05-22 ENCOUNTER — Encounter (HOSPITAL_COMMUNITY): Payer: Self-pay | Admitting: Physical Therapy

## 2020-05-22 ENCOUNTER — Ambulatory Visit (HOSPITAL_COMMUNITY): Payer: 59 | Admitting: Physical Therapy

## 2020-05-22 ENCOUNTER — Other Ambulatory Visit: Payer: Self-pay

## 2020-05-22 DIAGNOSIS — R262 Difficulty in walking, not elsewhere classified: Secondary | ICD-10-CM

## 2020-05-22 DIAGNOSIS — G8929 Other chronic pain: Secondary | ICD-10-CM

## 2020-05-22 DIAGNOSIS — M5442 Lumbago with sciatica, left side: Secondary | ICD-10-CM | POA: Diagnosis not present

## 2020-05-22 NOTE — Therapy (Addendum)
[place] [address] [place], [place], [address] Phone: [phone]   Fax:  [phone]  Physical Therapy Treatment and Progress Note  Patient Details  Name: Monica [name] MRN: [id number] Date of Birth: Oct 21, 1955 Referring Provider (PT): Jessy Oto  Progress Note Reporting Period 04/09/20 to 05/22/20  See note below for Objective Data and Assessment of Progress/Goals.       Encounter Date: 05/22/2020   PT End of Session - 05/22/20 1614    Visit Number 10    Number of Visits 12    Date for PT Re-Evaluation 06/04/20    Authorization Type [employer], no auth, VL 60    Authorization - Visit Number 10    Authorization - Number of Visits 60    Progress Note Due on Visit 10    PT Start Time 3734    PT Stop Time 1655    PT Time Calculation (min) 40 min    Activity Tolerance Patient tolerated treatment well    Behavior During Therapy [facility] for tasks assessed/performed           Past Medical History:  Diagnosis Date  . Arthritis   . Fibromyalgia   . Macular degeneration    Left eye  . Osteopenia 05/2018   T score -2.3 FRAX 11% / 1.8% overall stable from prior DEXA  . Raynaud disease   . Reiters syndrome   . Sleep disturbance   . Vaginal dryness     Past Surgical History:  Procedure Laterality Date  . BREAST SURGERY     LEFT BR LUMP-Benign  . COLONOSCOPY N/A 08/04/2018   Procedure: COLONOSCOPY;  Surgeon: Danie Binder, MD;  [facility];  Service: Endoscopy;  Laterality: N/A;  8:30  . POLYPECTOMY  08/04/2018   Procedure: POLYPECTOMY;  Surgeon: Danie Binder, MD;  [facility];  Service: Endoscopy;;    There were no vitals filed for this visit.   Subjective Assessment - 05/22/20 1656    Subjective States that she was hurting on and off all day yesterday and she was breathing and she doesn't know if she did too much of her exercises. States that yesterday she was trying to be  conscious of doing the stretches and didn't know if this bothered her. States she woke up at 11:30 with a pain level of 7/10. States today she is feeling better.    Pertinent History chronic low back pain, RA    Pain Onset More than a month ago              [facility] PT Assessment - 05/22/20 0001      Assessment   Medical Diagnosis DDD LBP    Referring Provider (PT) Jessy Oto      AROM   Lumbar Flexion 50% limited   pain across low back    Lumbar Extension 50% limited   pain across small of back    Lumbar - Right Side Bend 25% limited    Lumbar - Left Side Bend 25% limited    no pain    Lumbar - Right Rotation 50% limited    Lumbar - Left Rotation 50% limited      Special Tests    Special Tests Sacrolliac Tests    Sacroiliac Tests  Sacral Compression      Pelvic Dictraction   Comment no change       Pelvic Compression   comment no change  Gaenslen's test   Comments pain with left leg down, but no pain with right leg down       Sacral Compression   Comments thigh thrust - felt good on R                          [facility] Adult PT Treatment/Exercise - 05/22/20 0001      Lumbar Exercises: Supine   Other Supine Lumbar Exercises bent knee fall ins - 3 minutes B     Other Supine Lumbar Exercises hip add/abd isometric and scissor close isometric - 5" holds x10 Each       Manual Therapy   Manual Therapy Joint mobilization    Manual therapy comments separate rest of session    Joint Mobilization APs of lumbar spine - tolerated moderately well  grade I; left hip distraction, lateral hip mob L, AP of hip L grade II/III                   PT Education - 05/22/20 1656    Education Details Educated patient in movement and how it will be helpful and how fear avoidance is more determinantal then helpful. Discussed promoting optimal sleep environment to improve sleep quality.    Person(s) Educated Patient    Methods Explanation    Comprehension Verbalized  understanding            PT Short Term Goals - 04/11/20 1158      PT SHORT TERM GOAL #1   Title Patient will be independent in self management strategies to improve quality of life and functional outcomes.    Time 4    Period Weeks    Status On-going    Target Date 05/07/20      PT SHORT TERM GOAL #2   Title Patient will report at least 25% improvement in overall symptoms and/or function to demonstrate improved functional mobility    Time 4    Period Weeks    Status On-going    Target Date 05/07/20      PT SHORT TERM GOAL #3   Title Patient to report she has been able to sleep through the night without waking due to pain in order to improve QOL     Time 3    Period Weeks    Status On-going      PT SHORT TERM GOAL #4   Title Patient to be independent in correctly and consistently performing targeted HEP, to be updated as appropriate     Period Weeks    Status On-going             PT Long Term Goals - 05/22/20 1619      PT LONG TERM GOAL #1   Title Patient will report at least 50% improvement in overall symptoms and/or function to demonstrate improved functional mobility    Time 8    Period Weeks    Status On-going      PT LONG TERM GOAL #2   Title Patietn will be able to demonstrate painfree lumbar ROM.    Time 8    Period Weeks    Status On-going      PT LONG TERM GOAL #3   Title Patient will improve on FOTO score to meet predicted outcomes to demonstrate improved functional mobility.    Time 8    Period Weeks    Status On-going      PT LONG TERM GOAL #4  Title Patient to be participatory in regular aerobic exercise program, at least 3 days per week and 20 minutes in duration, in order to maintain functional gains and assist in preventing recurrence of symptoms     Period Weeks    Status On-going                 Plan - 05/22/20 1658    Clinical Impression Statement Focused on education and continued reassessment on this date. inconclusive  results with sacral testing during today's session. Discussed possible benefits of aquatic therapy and patient very interested. Will call patient to discuss for future sessions. Patient overall had a better day today in regards to overall pain. Will continue to assess hip/sacroiliac joint and lumbar spine as needed.    Personal Factors and Comorbidities Comorbidity 1    Comorbidities RA, chronic low back pain    Examination-Activity Limitations Bend;Lift;Locomotion Level;Sleep;Stairs;Bed Mobility    Examination-Participation Restrictions Yard Work;Cleaning    Stability/Clinical Decision Making Stable/Uncomplicated    Rehab Potential Good    PT Duration 8 weeks    PT Treatment/Interventions ADLs/Self Care Home Management;Aquatic Therapy;Electrical Stimulation;Cryotherapy;Iontophoresis 4mg /ml Dexamethasone;Moist Heat;Traction;Balance training;Therapeutic exercise;Therapeutic activities;Stair training;Gait training;DME Instruction;Neuromuscular re-education;Patient/family education;Manual techniques;Dry needling;Passive range of motion;Joint Manipulations    PT Next Visit Plan f/u with femoral nerve glide, lumbar and hip mobilizations, STM to hip External rotators, breath work for decrease in intra-abdominal pressure, psoas release- trial AP of lumbar spine    PT Home Exercise Plan 6/30 sciatic nerve glide L; 7/19: isometric ab set with exhale; 7/22: standing hip abd, standing hip ext/diganol, swiss ball LE marching, swiss ball pelvic tilts; 8/10 femoral nerve stretch, upper TRA activation           Patient will benefit from skilled therapeutic intervention in order to improve the following deficits and impairments:  Pain, Decreased strength, Decreased activity tolerance, Difficulty walking, Decreased range of motion  Visit Diagnosis: Chronic left-sided low back pain with left-sided sciatica  Difficulty in walking, not elsewhere classified     Problem List Patient Active Problem List    Diagnosis Date Noted  . Special screening for malignant neoplasms, colon   . High risk medication use 01/13/2017  . History of iritis 01/13/2017  . Essential hypertension 01/13/2017  . Macular degeneration 01/13/2017  . 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 Leg weakness 07/08/2014  . Difficulty walking 07/08/2014  . Vaginal dryness   . Reiter's syndrome ([place])   . Raynaud disease   . Fibromyalgia   . Inflammatory arthritis   . Osteopenia     5:04 PM, 05/22/20 Jerene Pitch, DPT Physical Therapy with [facility]  [phone] office  [place] [address] [place], [place], [address] Phone: [phone]   Fax:  [phone]  Name: Monica [name] MRN: [id number] Date of Birth: 1956/06/13

## 2020-05-23 NOTE — Addendum Note (Signed)
Addended by: Leeroy Cha on: 05/23/2020 09:28 AM   Modules accepted: Orders

## 2020-05-27 ENCOUNTER — Ambulatory Visit (HOSPITAL_COMMUNITY): Payer: 59 | Admitting: Physical Therapy

## 2020-05-27 ENCOUNTER — Other Ambulatory Visit: Payer: Self-pay

## 2020-05-27 DIAGNOSIS — M5442 Lumbago with sciatica, left side: Secondary | ICD-10-CM | POA: Diagnosis not present

## 2020-05-27 DIAGNOSIS — R262 Difficulty in walking, not elsewhere classified: Secondary | ICD-10-CM

## 2020-05-27 DIAGNOSIS — G8929 Other chronic pain: Secondary | ICD-10-CM

## 2020-05-27 NOTE — Therapy (Signed)
[place], [place], [address] Phone: [phone]   Fax:  [phone]  Physical Therapy Treatment  Patient Details  Name: Monica [name] MRN: [id number] Date of Birth: Jan 29, 1956 Referring Provider (PT): Jessy Oto   Encounter Date: 05/27/2020   PT End of Session - 05/27/20 1142    Visit Number 11    Number of Visits 12    Date for PT Re-Evaluation 06/04/20    Authorization Type [employer], no auth, VL 60    Authorization - Visit Number 11    Authorization - Number of Visits 60    Progress Note Due on Visit 10    PT Start Time 1134    PT Stop Time 1213    PT Time Calculation (min) 39 min    Activity Tolerance Patient tolerated treatment well    Behavior During Therapy [facility] for tasks assessed/performed           Past Medical History:  Diagnosis Date  . Arthritis   . Fibromyalgia   . Macular degeneration    Left eye  . Osteopenia 05/2018   T score -2.3 FRAX 11% / 1.8% overall stable from prior DEXA  . Raynaud disease   . Reiters syndrome   . Sleep disturbance   . Vaginal dryness     Past Surgical History:  Procedure Laterality Date  . BREAST SURGERY     LEFT BR LUMP-Benign  . COLONOSCOPY N/A 08/04/2018   Procedure: COLONOSCOPY;  Surgeon: Danie Binder, MD;  [facility];  Service: Endoscopy;  Laterality: N/A;  8:30  . POLYPECTOMY  08/04/2018   Procedure: POLYPECTOMY;  Surgeon: Danie Binder, MD;  [facility];  Service: Endoscopy;;    There were no vitals filed for this visit.   Subjective Assessment - 05/27/20 1140    Subjective states this weekend she started off well Saturday morning and then she was doing yard work and was leaning over and that when she went to come back up she couldn't come back up. States that the pain was more on the right side. States that she doesn't know if it was a catch. States that today she feels good. She did her stretches this  morning. Reports about 2/10 pain in left lower back and leg.    Pertinent History chronic low back pain, RA    Currently in Pain? Yes    Pain Score 2     Pain Location Back    Pain Orientation Left;Lower    Pain Descriptors / Indicators Aching    Pain Onset More than a month ago              [facility] PT Assessment - 05/27/20 0001      Assessment   Medical Diagnosis DDD LBP    Referring Provider (PT) Jessy Oto                         [facility] Adult PT Treatment/Exercise - 05/27/20 0001      Lumbar Exercises: Seated   Other Seated Lumbar Exercises seated on exercise ball pelvic tilts x30, bounces x30, clockwise adn counterclockwise x30 Each       Lumbar Exercises: Supine   Other Supine Lumbar Exercises hip ext L and flexion isometric L holds 5" holds B x15      Lumbar Exercises: Sidelying   Other Sidelying Lumbar Exercises book stretch x15 5" holds -  different knee positions B                  PT Education - 05/27/20 1322    Education Details rationale for exercises, HEP and aquatics    Person(s) Educated Patient    Methods Explanation    Comprehension Verbalized understanding            PT Short Term Goals - 04/11/20 1158      PT SHORT TERM GOAL #1   Title Patient will be independent in self management strategies to improve quality of life and functional outcomes.    Time 4    Period Weeks    Status On-going    Target Date 05/07/20      PT SHORT TERM GOAL #2   Title Patient will report at least 25% improvement in overall symptoms and/or function to demonstrate improved functional mobility    Time 4    Period Weeks    Status On-going    Target Date 05/07/20      PT SHORT TERM GOAL #3   Title Patient to report she has been able to sleep through the night without waking due to pain in order to improve QOL     Time 3    Period Weeks    Status On-going      PT SHORT TERM GOAL #4   Title Patient to be independent in correctly and  consistently performing targeted HEP, to be updated as appropriate     Period Weeks    Status On-going             PT Long Term Goals - 05/22/20 1619      PT LONG TERM GOAL #1   Title Patient will report at least 50% improvement in overall symptoms and/or function to demonstrate improved functional mobility    Time 8    Period Weeks    Status On-going      PT LONG TERM GOAL #2   Title Patietn will be able to demonstrate painfree lumbar ROM.    Time 8    Period Weeks    Status On-going      PT LONG TERM GOAL #3   Title Patient will improve on FOTO score to meet predicted outcomes to demonstrate improved functional mobility.    Time 8    Period Weeks    Status On-going      PT LONG TERM GOAL #4   Title Patient to be participatory in regular aerobic exercise program, at least 3 days per week and 20 minutes in duration, in order to maintain functional gains and assist in preventing recurrence of symptoms     Period Weeks    Status On-going                 Plan - 05/27/20 1323    Clinical Impression Statement Focused on sacroiliac joint on this date. Tolerated book stretch towards the left better then to the right with audible cavitation with movement. Educated patient in [place] and provided with information on upcoming aquatic session. Seated ball exercises tolerated well with patient reporting fatigue in pelvis (pointed to sacroiliac joint after exercises). Patient to try aquatics next session.    Personal Factors and Comorbidities Comorbidity 1    Comorbidities RA, chronic low back pain    Examination-Activity Limitations Bend;Lift;Locomotion Level;Sleep;Stairs;Bed Mobility    Examination-Participation Restrictions Yard Work;Cleaning    Stability/Clinical Decision Making Stable/Uncomplicated    Rehab Potential Good    PT  Duration 8 weeks    PT Treatment/Interventions ADLs/Self Care Home Management;Aquatic Therapy;Electrical Stimulation;Cryotherapy;Iontophoresis  4mg /ml Dexamethasone;Moist Heat;Traction;Balance training;Therapeutic exercise;Therapeutic activities;Stair training;Gait training;DME Instruction;Neuromuscular re-education;Patient/family education;Manual techniques;Dry needling;Passive range of motion;Joint Manipulations    PT Next Visit Plan aquatics next session. f/u with femoral nerve glide, lumbar and hip mobilizations, STM to hip External rotators, breath work for decrease in intra-abdominal pressure, psoas release- trial AP of lumbar spine    PT Home Exercise Plan 6/30 sciatic nerve glide L; 7/19: isometric ab set with exhale; 7/22: standing hip abd, standing hip ext/diganol, swiss ball LE marching, swiss ball pelvic tilts; 8/10 femoral nerve stretch, upper TRA activation           Patient will benefit from skilled therapeutic intervention in order to improve the following deficits and impairments:  Pain, Decreased strength, Decreased activity tolerance, Difficulty walking, Decreased range of motion  Visit Diagnosis: Chronic left-sided low back pain with left-sided sciatica  Difficulty in walking, not elsewhere classified     Problem List Patient Active Problem List   Diagnosis Date Noted  . Special screening for malignant neoplasms, colon   . High risk medication use 01/13/2017  . History of iritis 01/13/2017  . Essential hypertension 01/13/2017  . Macular degeneration 01/13/2017  . 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 Leg weakness 07/08/2014  . Difficulty walking 07/08/2014  . Vaginal dryness   . Reiter's syndrome ([place])   . Raynaud disease   . Fibromyalgia   . Inflammatory arthritis   . Osteopenia    1:25 PM, 05/27/20 Jerene Pitch, DPT Physical Therapy with [facility]  [phone] office  [place] [address] [place], [place], [address] Phone: [phone]   Fax:  [phone]  Name: Monica [name] MRN: [id number] Date of Birth: 11/21/1955

## 2020-05-29 ENCOUNTER — Encounter (HOSPITAL_COMMUNITY): Payer: 59 | Admitting: Physical Therapy

## 2020-05-29 ENCOUNTER — Other Ambulatory Visit: Payer: Self-pay

## 2020-05-29 ENCOUNTER — Ambulatory Visit (HOSPITAL_COMMUNITY): Payer: 59 | Admitting: Physical Therapy

## 2020-05-29 DIAGNOSIS — R262 Difficulty in walking, not elsewhere classified: Secondary | ICD-10-CM

## 2020-05-29 DIAGNOSIS — G8929 Other chronic pain: Secondary | ICD-10-CM

## 2020-05-29 DIAGNOSIS — M5442 Lumbago with sciatica, left side: Secondary | ICD-10-CM | POA: Diagnosis not present

## 2020-05-30 ENCOUNTER — Encounter (HOSPITAL_COMMUNITY): Payer: Self-pay | Admitting: Physical Therapy

## 2020-05-30 NOTE — Therapy (Signed)
[facility] [place], [place], [address] Phone: [phone]   Fax:  [phone]  Physical Therapy Treatment  Patient Details  Name: Monica [name] MRN: [id number] Date of Birth: [birth year] Referring Provider (PT): Jessy Oto   Encounter Date: 05/29/2020   PT End of Session - 05/30/20 1338    Visit Number 12    Number of Visits 12    Date for PT Re-Evaluation 06/04/20    Authorization Type [employer], no auth, VL 60    Authorization - Visit Number 12    Authorization - Number of Visits 60    Progress Note Due on Visit 10    PT Start Time 1400    PT Stop Time 1445    PT Time Calculation (min) 45 min    Activity Tolerance Patient tolerated treatment well    Behavior During Therapy [facility] for tasks assessed/performed           Past Medical History:  Diagnosis Date  . Arthritis   . Fibromyalgia   . Macular degeneration    Left eye  . Osteopenia 05/2018   T score -2.3 FRAX 11% / 1.8% overall stable from prior DEXA  . Raynaud disease   . Reiters syndrome   . Sleep disturbance   . Vaginal dryness     Past Surgical History:  Procedure Laterality Date  . BREAST SURGERY     LEFT BR LUMP-Benign  . COLONOSCOPY N/A 08/04/2018   Procedure: COLONOSCOPY;  Surgeon: Danie Binder, MD;  [facility];  Service: Endoscopy;  Laterality: N/A;  8:30  . POLYPECTOMY  08/04/2018   Procedure: POLYPECTOMY;  Surgeon: Danie Binder, MD;  [facility];  Service: Endoscopy;;    There were no vitals filed for this visit.   Subjective Assessment - 05/30/20 1337    Subjective Patient says she "feels good if I dont move the wrong way" and notes that she has been taking less tylenol.    Pertinent History chronic low back pain, RA    Currently in Pain? Yes    Pain Score 3     Pain Location Back    Pain Orientation Left;Lower    Pain Descriptors / Indicators Aching    Pain Onset More than a month ago                          Adult Aquatic Therapy - 05/30/20 1344      Treatment   Gait dynamic warmup: pool walking, sidestepping, retro walking 3RT each     Exercises TA brace at pool wall 20 x 5", posterior pelvic tilts at pool wall 10 x 5", noodle row x20, noodle twist x20, heel raises 2 x 10, piriformis stretch at pool wall 3 x 30" each, single arm DB pull down x20, double arm DB pulldown x10                         PT Short Term Goals - 04/11/20 1158      PT SHORT TERM GOAL #1   Title Patient will be independent in self management strategies to improve quality of life and functional outcomes.    Time 4    Period Weeks    Status On-going    Target Date 05/07/20      PT SHORT TERM GOAL #2   Title Patient will report at least  25% improvement in overall symptoms and/or function to demonstrate improved functional mobility    Time 4    Period Weeks    Status On-going    Target Date 05/07/20      PT SHORT TERM GOAL #3   Title Patient to report she has been able to sleep through the night without waking due to pain in order to improve QOL     Time 3    Period Weeks    Status On-going      PT SHORT TERM GOAL #4   Title Patient to be independent in correctly and consistently performing targeted HEP, to be updated as appropriate     Period Weeks    Status On-going             PT Long Term Goals - 05/22/20 1619      PT LONG TERM GOAL #1   Title Patient will report at least 50% improvement in overall symptoms and/or function to demonstrate improved functional mobility    Time 8    Period Weeks    Status On-going      PT LONG TERM GOAL #2   Title Patietn will be able to demonstrate painfree lumbar ROM.    Time 8    Period Weeks    Status On-going      PT LONG TERM GOAL #3   Title Patient will improve on FOTO score to meet predicted outcomes to demonstrate improved functional mobility.    Time 8    Period Weeks    Status On-going      PT  LONG TERM GOAL #4   Title Patient to be participatory in regular aerobic exercise program, at least 3 days per week and 20 minutes in duration, in order to maintain functional gains and assist in preventing recurrence of symptoms     Period Weeks    Status On-going                 Plan - 05/30/20 1339    Clinical Impression Statement Initiated aquatic therapy. Patient educated on benefits and application. Focused session on core strengthening, hip and lumbar mobility. Patient tolerated session well overall, but did note increased tingling in LLE after noodle twist for lumbar rotation. Patient noted resolution of tingling after piriformis stretching at wall. Patient educated on anatomy of hip and lumbar musculature and implication of tight hip rotators (piriformis) possibly contributing to radicular symptoms in LE. Patient educated on importance of and frequency of added piriformis stretching to HEP. Will assess response at next session.    Personal Factors and Comorbidities Comorbidity 1    Comorbidities RA, chronic low back pain    Examination-Activity Limitations Bend;Lift;Locomotion Level;Sleep;Stairs;Bed Mobility    Examination-Participation Restrictions Yard Work;Cleaning    Stability/Clinical Decision Making Stable/Uncomplicated    Rehab Potential Good    PT Duration 8 weeks    PT Treatment/Interventions ADLs/Self Care Home Management;Aquatic Therapy;Electrical Stimulation;Cryotherapy;Iontophoresis 4mg /ml Dexamethasone;Moist Heat;Traction;Balance training;Therapeutic exercise;Therapeutic activities;Stair training;Gait training;DME Instruction;Neuromuscular re-education;Patient/family education;Manual techniques;Dry needling;Passive range of motion;Joint Manipulations    PT Next Visit Plan aquatics next session. f/u with femoral nerve glide, lumbar and hip mobilizations, STM to hip External rotators, breath work for decrease in intra-abdominal pressure, psoas release- trial AP of lumbar  spine    PT Home Exercise Plan 6/30 sciatic nerve glide L; 7/19: isometric ab set with exhale; 7/22: standing hip abd, standing hip ext/diganol, swiss ball LE marching, swiss ball pelvic tilts; 8/10 femoral nerve stretch, upper TRA activation,  05/29/20: piriformis stretching    Consulted and Agree with Plan of Care Patient           Patient will benefit from skilled therapeutic intervention in order to improve the following deficits and impairments:  Pain, Decreased strength, Decreased activity tolerance, Difficulty walking, Decreased range of motion  Visit Diagnosis: Chronic left-sided low back pain with left-sided sciatica  Difficulty in walking, not elsewhere classified     Problem List Patient Active Problem List   Diagnosis Date Noted  . Special screening for malignant neoplasms, colon   . High risk medication use 01/13/2017  . History of iritis 01/13/2017  . Essential hypertension 01/13/2017  . Macular degeneration 01/13/2017  . 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 Leg weakness 07/08/2014  . Difficulty walking 07/08/2014  . Vaginal dryness   . Reiter's syndrome ([place])   . Raynaud disease   . Fibromyalgia   . Inflammatory arthritis   . Osteopenia     1:49 PM, 05/30/20 Josue Hector PT DPT  Physical Therapist with [facility]  [phone] [place] [address] [place], [place], [address] Phone: [phone]   Fax:  [phone]  Name: KYLEEN VILLATORO MRN: [id number] Date of Birth: [birth year]

## 2020-06-03 ENCOUNTER — Other Ambulatory Visit: Payer: Self-pay

## 2020-06-03 ENCOUNTER — Encounter (HOSPITAL_COMMUNITY): Payer: Self-pay | Admitting: Physical Therapy

## 2020-06-03 ENCOUNTER — Ambulatory Visit (HOSPITAL_COMMUNITY): Payer: 59 | Admitting: Physical Therapy

## 2020-06-03 DIAGNOSIS — G8929 Other chronic pain: Secondary | ICD-10-CM

## 2020-06-03 DIAGNOSIS — R262 Difficulty in walking, not elsewhere classified: Secondary | ICD-10-CM

## 2020-06-03 DIAGNOSIS — M5442 Lumbago with sciatica, left side: Secondary | ICD-10-CM

## 2020-06-03 NOTE — Therapy (Signed)
[facility] [address] [place], [place], [address] Phone: [phone]   Fax:  [phone]  Physical Therapy Treatment Discharge Note  Patient Details  Name: Monica [name] MRN: [id number] Date of Birth: [birth year] Referring Provider (PT): Jessy Oto  PHYSICAL THERAPY DISCHARGE SUMMARY  Visits from Start of Care: 13  Current functional level related to goals / functional outcomes: See below   Remaining deficits: Continued pain   Education / Equipment: See below  Plan: Patient agrees to discharge.  Patient goals were partially met. Patient is being discharged due to being pleased with the current functional level.  ?????       Encounter Date: 06/03/2020   PT End of Session - 06/03/20 1129    Visit Number 13    Number of Visits 13    Date for PT Re-Evaluation 06/04/20    Authorization Type [employer], no auth, VL 60    Authorization - Visit Number 13    Authorization - Number of Visits 60    Progress Note Due on Visit --    PT Start Time 1130    PT Stop Time 1215    PT Time Calculation (min) 45 min    Activity Tolerance Patient tolerated treatment well    Behavior During Therapy WFL for tasks assessed/performed           Past Medical History:  Diagnosis Date  . Arthritis   . Fibromyalgia   . Macular degeneration    Left eye  . Osteopenia 05/2018   T score -2.3 FRAX 11% / 1.8% overall stable from prior DEXA  . Raynaud disease   . Reiters syndrome   . Sleep disturbance   . Vaginal dryness     Past Surgical History:  Procedure Laterality Date  . BREAST SURGERY     LEFT BR LUMP-Benign  . COLONOSCOPY N/A 08/04/2018   Procedure: COLONOSCOPY;  Surgeon: Danie Binder, MD;  [facility];  Service: Endoscopy;  Laterality: N/A;  8:30  . POLYPECTOMY  08/04/2018   Procedure: POLYPECTOMY;  Surgeon: Danie Binder, MD;  [facility];  Service: Endoscopy;;    There were no vitals  filed for this visit.   Subjective Assessment - 06/03/20 1318    Subjective States the pool was ok. States that she felt it was more difficult that normal. States overall she didn't notice a big change in symptoms. States that recently her back has been better and is not as painful with bending. But the left hip has been really sore /feels bruised and it will occasionally pop. States she really likes the open book stretch.    Pertinent History chronic low back pain, RA    Pain Onset More than a month ago              [facility] PT Assessment - 06/03/20 0001      Assessment   Medical Diagnosis DDD LBP    Referring Provider (PT) Jessy Oto      Observation/Other Assessments   Focus on Therapeutic Outcomes (FOTO)  53% function        AROM   Lumbar Flexion 25% limited    Lumbar Extension 50% limited    Lumbar - Right Side Bend 25% limited    Lumbar - Left Side Bend 25% limited     Lumbar - Right Rotation 50% limited    Lumbar - Left Rotation 50% limited  [place] Adult PT Treatment/Exercise - 06/03/20 0001      Lumbar Exercises: Seated   Other Seated Lumbar Exercises seated abd/add isometric 2x10 5" holds      Lumbar Exercises: Sidelying   Other Sidelying Lumbar Exercises book stretch x15 5" holds -       Manual Therapy   Manual Therapy Soft tissue mobilization    Manual therapy comments separate rest of session    Soft tissue mobilization STM to left piriformis and glutes - tolerated moderately well                   PT Education - 06/03/20 1325    Education Details educated patient in current condition, FOTO score, reviewed HEP, answered all questions. Discussed progress made and focus on certain exercises and increasing overall activity as tolerated. differences and benefits of massage therapy, chiropractic care.    Person(s) Educated Patient    Methods Explanation    Comprehension Verbalized understanding            PT  Short Term Goals - 06/03/20 1159      PT SHORT TERM GOAL #1   Title Patient will be independent in self management strategies to improve quality of life and functional outcomes.    Time 4    Period Weeks    Status Achieved    Target Date 05/07/20      PT SHORT TERM GOAL #2   Title Patient will report at least 25% improvement in overall symptoms and/or function to demonstrate improved functional mobility    Baseline atleast 75% better - because she knows her limitations    Time 4    Period Weeks    Status Achieved    Target Date 05/07/20      PT SHORT TERM GOAL #3   Title Patient to report she has been able to sleep through the night without waking due to pain in order to improve QOL     Baseline still waking up but not due to pain.    Time 3    Period Weeks    Status Achieved      PT SHORT TERM GOAL #4   Title Patient to be independent in correctly and consistently performing targeted HEP, to be updated as appropriate     Period Weeks    Status Achieved             PT Long Term Goals - 06/03/20 1201      PT LONG TERM GOAL #1   Title Patient will report at least 50% improvement in overall symptoms and/or function to demonstrate improved functional mobility    Baseline 75% better    Time 8    Period Weeks    Status Achieved      PT LONG TERM GOAL #2   Title Patietn will be able to demonstrate painfree lumbar ROM.    Time 8    Period Weeks    Status On-going      PT LONG TERM GOAL #3   Title Patient will improve on FOTO score to meet predicted outcomes to demonstrate improved functional mobility.    Time 8    Period Weeks    Status On-going      PT LONG TERM GOAL #4   Title Patient to be participatory in regular aerobic exercise program, at least 3 days per week and 20 minutes in duration, in order to maintain functional gains and assist in preventing recurrence of symptoms  Period Weeks    Status On-going                 Plan - 06/03/20 1401     Clinical Impression Statement Patient has been present for 13 visits since start of therapy. Overall, she has met all short-term goals and is working towards long term goals with one of 4 long term goals met at this time. Reviewed HEP and answered all questions on this date. Patient feels confident in current home program and working towards goals at this time. Discharge to HEP secondary to patient progress and independence in HEP.    Personal Factors and Comorbidities Comorbidity 1    Comorbidities RA, chronic low back pain    Examination-Activity Limitations Bend;Lift;Locomotion Level;Sleep;Stairs;Bed Mobility    Examination-Participation Restrictions Yard Work;Cleaning    Stability/Clinical Decision Making Stable/Uncomplicated    Rehab Potential Good    PT Frequency 1x / week    PT Duration Other (comment)   1 week   PT Treatment/Interventions ADLs/Self Care Home Management;Aquatic Therapy;Electrical Stimulation;Cryotherapy;Iontophoresis 58m/ml Dexamethasone;Moist Heat;Traction;Balance training;Therapeutic exercise;Therapeutic activities;Stair training;Gait training;DME Instruction;Neuromuscular re-education;Patient/family education;Manual techniques;Dry needling;Passive range of motion;Joint Manipulations    PT Next Visit Plan discharge patient to HEP at this time    PT Home Exercise Plan 6/30 sciatic nerve glide L; 7/19: isometric ab set with exhale; 7/22: standing hip abd, standing hip ext/diganol, swiss ball LE marching, swiss ball pelvic tilts; 8/10 femoral nerve stretch, upper TRA activation, 05/29/20: piriformis stretching    Consulted and Agree with Plan of Care Patient           Patient will benefit from skilled therapeutic intervention in order to improve the following deficits and impairments:  Pain, Decreased strength, Decreased activity tolerance, Difficulty walking, Decreased range of motion  Visit Diagnosis: Chronic left-sided low back pain with left-sided sciatica  Difficulty  in walking, not elsewhere classified     Problem List Patient Active Problem List   Diagnosis Date Noted  . Special screening for malignant neoplasms, colon   . High risk medication use 01/13/2017  . History of iritis 01/13/2017  . Essential hypertension 01/13/2017  . Macular degeneration 01/13/2017  . 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 Leg weakness 07/08/2014  . Difficulty walking 07/08/2014  . Vaginal dryness   . [place]'s [place]   . Raynaud disease   . Fibromyalgia   . Inflammatory arthritis   . Osteopenia    2:21 PM, 06/03/20 MJerene Pitch DPT [facility] [phone]  [facility]roup[address] [address]Phone: [phone]  Fax:  [phone] Name: Monica GILLESMRN: [id number] of Birth: [birth year]

## 2020-06-05 ENCOUNTER — Encounter (HOSPITAL_COMMUNITY): Payer: 59 | Admitting: Physical Therapy

## 2020-06-10 ENCOUNTER — Other Ambulatory Visit: Payer: Self-pay | Admitting: Rheumatology

## 2020-06-10 NOTE — Telephone Encounter (Signed)
Last Visit: 04/10/2020 Next Visit: 09/11/2020 Labs: 04/10/2020 CMP WNL. MCHC is borderline low. Rest of CBC WNL. TB Gold: 01/10/2020 Neg   Current Dose per office note 04/10/2020: Humira 40 mg every 14 days   DX: Inflammatory arthritis  Okay to refill Humira?

## 2020-06-11 ENCOUNTER — Other Ambulatory Visit: Payer: Self-pay | Admitting: Rheumatology

## 2020-06-11 ENCOUNTER — Telehealth: Payer: Self-pay | Admitting: Rheumatology

## 2020-06-11 NOTE — Telephone Encounter (Signed)
Patient requesting orders for labs to be released to [facility] in [place].

## 2020-06-11 NOTE — Telephone Encounter (Signed)
Last Visit:04/10/2020 Next Visit:09/11/2020  Okay to refill per Dr. Deveshwar  

## 2020-06-11 NOTE — Telephone Encounter (Signed)
Spoke with patient and advised she is not due for labs until July 11, 2020.

## 2020-06-25 ENCOUNTER — Ambulatory Visit: Payer: 59 | Admitting: Specialist

## 2020-07-10 ENCOUNTER — Other Ambulatory Visit: Payer: Self-pay | Admitting: Rheumatology

## 2020-07-11 ENCOUNTER — Ambulatory Visit: Payer: 59 | Admitting: Specialist

## 2020-07-11 NOTE — Telephone Encounter (Signed)
Last Visit: 04/10/2020 Next Visit: 09/11/2020 Labs: 04/10/2020 CMP WNL. MCHC is borderline low. Rest of CBC WNL.  Current Dose per office note 04/10/2020:  sulfasalazine 500 mg 2 tablets twice daily  DX: Inflammatory arthritis  Left message to advise patient she is due to update labs.  Okay to refill SSZ and Fabb tabs?

## 2020-07-18 ENCOUNTER — Other Ambulatory Visit: Payer: Self-pay

## 2020-07-18 DIAGNOSIS — Z79899 Other long term (current) drug therapy: Secondary | ICD-10-CM

## 2020-07-19 LAB — COMPLETE METABOLIC PANEL WITH GFR
AG Ratio: 1.6 (calc) (ref 1.0–2.5)
ALT: 16 U/L (ref 6–29)
AST: 21 U/L (ref 10–35)
Albumin: 4.2 g/dL (ref 3.6–5.1)
Alkaline phosphatase (APISO): 64 U/L (ref 37–153)
BUN: 13 mg/dL (ref 7–25)
CO2: 31 mmol/L (ref 20–32)
Calcium: 9.4 mg/dL (ref 8.6–10.4)
Chloride: 103 mmol/L (ref 98–110)
Creat: 0.86 mg/dL (ref 0.50–0.99)
GFR, Est African American: 83 mL/min/{1.73_m2} (ref >=60)
GFR, Est Non African American: 72 mL/min/{1.73_m2} (ref >=60)
Globulin: 2.7 g/dL (calc) (ref 1.9–3.7)
Glucose, Bld: 96 mg/dL (ref 65–99)
Potassium: 3.9 mmol/L (ref 3.5–5.3)
Sodium: 141 mmol/L (ref 135–146)
Total Bilirubin: 0.4 mg/dL (ref 0.2–1.2)
Total Protein: 6.9 g/dL (ref 6.1–8.1)

## 2020-07-19 LAB — CBC WITH DIFFERENTIAL/PLATELET
Absolute Monocytes: 504 cells/uL (ref 200–950)
Basophils Absolute: 18 cells/uL (ref 0–200)
Basophils Relative: 0.3 %
Eosinophils Absolute: 72 cells/uL (ref 15–500)
Eosinophils Relative: 1.2 %
HCT: 41.5 % (ref 35.0–45.0)
Hemoglobin: 13.6 g/dL (ref 11.7–15.5)
Lymphs Abs: 2016 cells/uL (ref 850–3900)
MCH: 30 pg (ref 27.0–33.0)
MCHC: 32.8 g/dL (ref 32.0–36.0)
MCV: 91.6 fL (ref 80.0–100.0)
MPV: 9 fL (ref 7.5–12.5)
Monocytes Relative: 8.4 %
Neutro Abs: 3390 cells/uL (ref 1500–7800)
Neutrophils Relative %: 56.5 %
Platelets: 344 10*3/uL (ref 140–400)
RBC: 4.53 10*6/uL (ref 3.80–5.10)
RDW: 12.1 % (ref 11.0–15.0)
Total Lymphocyte: 33.6 %
WBC: 6 10*3/uL (ref 3.8–10.8)

## 2020-07-21 NOTE — Progress Notes (Signed)
CBC and CMP WNL

## 2020-08-07 ENCOUNTER — Ambulatory Visit: Payer: 59 | Admitting: Specialist

## 2020-08-07 ENCOUNTER — Other Ambulatory Visit: Payer: Self-pay

## 2020-08-07 ENCOUNTER — Encounter: Payer: Self-pay | Admitting: Specialist

## 2020-08-07 VITALS — BP 154/97 | HR 94 | Ht 64.0 in | Wt 132.0 lb

## 2020-08-07 DIAGNOSIS — M5116 Intervertebral disc disorders with radiculopathy, lumbar region: Secondary | ICD-10-CM

## 2020-08-07 DIAGNOSIS — M5416 Radiculopathy, lumbar region: Secondary | ICD-10-CM

## 2020-08-07 NOTE — Progress Notes (Signed)
Office Visit Note   Patient: Monica [name]           Date of Birth: 1956/04/11           MRN: [id number] Visit Date: 08/07/2020              Requested by: Lemmie Evens, MD [address],  [place] [address] PCP: Lemmie Evens, MD   Assessment & Plan: Visit Diagnoses:  1. Spondylosis of lumbar region without myelopathy or radiculopathy   2. Lumbar radiculopathy     Plan: Avoid bending, stooping and avoid lifting weights greater than 10 lbs. Avoid prolong standing and walking. Avoid frequent bending and stooping  No lifting greater than 10 lbs. May use ice or moist heat for pain. Weight loss is of benefit. Handicap license is approved. Dr. Romona Curls secretary/Assistant will call to arrange for Left L5-S1 ESI transforamenal  For foramenal protrusion that is small with episodic nerve irritation. Follow-Up Instructions: No follow-ups on file.   Orders:  No orders of the defined types were placed in this encounter.  No orders of the defined types were placed in this encounter.     Procedures: No procedures performed   Clinical Data: No additional findings.   Subjective: Chief Complaint  Patient presents with  . Lower Back - Follow-up    64 year old female with overall persisting back and buttock and leg pain. Worsened with prolong standing with attending a funeral service outdoors and had increased pain. The left leg is worse than the right. She is concerned that the pain is excruciating and it continues to be  Episodic.    Review of Systems  Constitutional: Negative.   HENT: Negative.   Eyes: Negative.   Respiratory: Negative.   Cardiovascular: Negative.   Gastrointestinal: Negative.   Endocrine: Negative.   Genitourinary: Negative.   Musculoskeletal: Negative.   Skin: Negative.   Allergic/Immunologic: Negative.   Neurological: Negative.   Hematological: Negative.   Psychiatric/Behavioral: Negative.      Objective: Vital Signs: BP (!)  154/97 (BP Location: Left Arm, Patient Position: Sitting)   Pulse 94   Ht 5\' 4"  (1.626 m)   Wt 132 lb (59.9 kg)   BMI 22.66 kg/m   Physical Exam Constitutional:      Appearance: She is well-developed.  HENT:     Head: Normocephalic and atraumatic.  Eyes:     Pupils: Pupils are equal, round, and reactive to light.  Pulmonary:     Effort: Pulmonary effort is normal.     Breath sounds: Normal breath sounds.  Abdominal:     General: Bowel sounds are normal.     Palpations: Abdomen is soft.  Musculoskeletal:     Cervical back: Normal range of motion and neck supple.  Skin:    General: Skin is warm and dry.  Neurological:     Mental Status: She is alert and oriented to person, place, and time.  Psychiatric:        Behavior: Behavior normal.        Thought Content: Thought content normal.        Judgment: Judgment normal.     Back Exam   Tenderness  The patient is experiencing tenderness in the lumbar.  Range of Motion  Extension: abnormal  Flexion: abnormal  Lateral bend right: normal  Lateral bend left: normal  Rotation right: normal  Rotation left: abnormal   Muscle Strength  Right Quadriceps:  5/5  Left Quadriceps:  5/5  Right Hamstrings:  5/5   Reflexes  Achilles: 2/4      Specialty Comments:  No specialty comments available.  Imaging: No results found.   PMFS History: Patient Active Problem List   Diagnosis Date Noted  . Special screening for malignant neoplasms, colon   . High risk medication use 01/13/2017  . History of iritis 01/13/2017  . Essential hypertension 01/13/2017  . Macular degeneration 01/13/2017  . 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 Leg weakness 07/08/2014  . Difficulty walking 07/08/2014  . Vaginal dryness   . Reiter's syndrome ([place])   . Raynaud disease   . Fibromyalgia   . Inflammatory arthritis   . Osteopenia    Past Medical History:  Diagnosis Date  . Arthritis   .  Fibromyalgia   . Macular degeneration    Left eye  . Osteopenia 05/2018   T score -2.3 FRAX 11% / 1.8% overall stable from prior DEXA  . Raynaud disease   . Reiters syndrome   . Sleep disturbance   . Vaginal dryness     Family History  Problem Relation Age of Onset  . Hypertension Sister   . Heart disease Brother   . Hyperlipidemia Brother   . Heart attack Brother   . Alzheimer's disease Mother   . Osteoarthritis Mother   . High Cholesterol Sister   . Heart attack Brother   . Heart disease Brother   . Healthy Son   . Healthy Son     Past Surgical History:  Procedure Laterality Date  . BREAST SURGERY     LEFT BR LUMP-Benign  . COLONOSCOPY N/A 08/04/2018   Procedure: COLONOSCOPY;  Surgeon: Danie Binder, MD;  [facility];  Service: Endoscopy;  Laterality: N/A;  8:30  . POLYPECTOMY  08/04/2018   Procedure: POLYPECTOMY;  Surgeon: Danie Binder, MD;  [facility];  Service: Endoscopy;;   Social History   Occupational History  . Not on file  Tobacco Use  . Smoking status: Never Smoker  . Smokeless tobacco: Never Used  Vaping Use  . Vaping Use: Never used  Substance and Sexual Activity  . Alcohol use: Not Currently  . Drug use: Never  . Sexual activity: Yes    Birth control/protection: Post-menopausal    Comment: 1st intercourse 64 yo- Fewer than 5 partners

## 2020-08-11 ENCOUNTER — Other Ambulatory Visit: Payer: Self-pay | Admitting: Rheumatology

## 2020-08-11 NOTE — Telephone Encounter (Signed)
Last Visit:04/10/2020 Next Visit:09/11/2020 Labs:07/18/2020 CBC and CMP WNL  Current Dose per office note7/10/2019:sulfasalazine 500 mg 2 tablets twice daily HU:[id number] arthritis  Okay to refill per Dr. Estanislado Pandy

## 2020-08-12 ENCOUNTER — Other Ambulatory Visit: Payer: Self-pay | Admitting: Radiology

## 2020-08-12 ENCOUNTER — Telehealth: Payer: Self-pay | Admitting: Specialist

## 2020-08-12 DIAGNOSIS — M5442 Lumbago with sciatica, left side: Secondary | ICD-10-CM

## 2020-08-12 DIAGNOSIS — M47816 Spondylosis without myelopathy or radiculopathy, lumbar region: Secondary | ICD-10-CM

## 2020-08-12 DIAGNOSIS — M5136 Other intervertebral disc degeneration, lumbar region: Secondary | ICD-10-CM

## 2020-08-12 DIAGNOSIS — M5416 Radiculopathy, lumbar region: Secondary | ICD-10-CM

## 2020-08-12 DIAGNOSIS — M5116 Intervertebral disc disorders with radiculopathy, lumbar region: Secondary | ICD-10-CM

## 2020-08-12 NOTE — Telephone Encounter (Signed)
I called, she states that she has not heard anything from the referral to Dr. Ernestina Patches I advised that it may take up to 1 week to hear from them as they have to contact her insurance company and find out if the procedure requires prior auth and if so they have to send the info to [employer] and they have to review it and this can take some time and depending on if they don't approve we have to setup peer to peer where our Drs. Call them and discuss tis with them and she states that she understands this

## 2020-08-12 NOTE — Telephone Encounter (Signed)
Patient called requesting a call back about Dr. Louanne Skye sending referral to another doctor for back injections. Please call patient about this matter at [phone].

## 2020-08-26 ENCOUNTER — Other Ambulatory Visit: Payer: Self-pay | Admitting: Physician Assistant

## 2020-08-27 NOTE — Telephone Encounter (Signed)
Last Visit:04/10/2020 Next Visit:09/11/2020  Okay to refill per Dr. Estanislado Pandy

## 2020-08-29 NOTE — Progress Notes (Deleted)
Office Visit Note  Patient: Monica [name]             Date of Birth: 1956-03-25           MRN: [id number]             PCP: Lemmie Evens, MD Referring: Lemmie Evens, MD Visit Date: 09/11/2020 Occupation: @GUAROCC @  Subjective:  No chief complaint on file.   History of Present Illness: Monica [name] is a 64 y.o. female ***   Activities of Daily Living:  Patient reports morning stiffness for *** {minute/hour:19697}.   Patient {ACTIONS;DENIES/REPORTS:21021675::"Denies"} nocturnal pain.  Difficulty dressing/grooming: {ACTIONS;DENIES/REPORTS:21021675::"Denies"} Difficulty climbing stairs: {ACTIONS;DENIES/REPORTS:21021675::"Denies"} Difficulty getting out of chair: {ACTIONS;DENIES/REPORTS:21021675::"Denies"} Difficulty using hands for taps, buttons, cutlery, and/or writing: {ACTIONS;DENIES/REPORTS:21021675::"Denies"}  No Rheumatology ROS completed.   PMFS History:  Patient Active Problem List   Diagnosis Date Noted  . Special screening for malignant neoplasms, colon   . High risk medication use 01/13/2017  . History of iritis 01/13/2017  . Essential hypertension 01/13/2017  . Macular degeneration 01/13/2017  . 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 Leg weakness 07/08/2014  . Difficulty walking 07/08/2014  . Vaginal dryness   . 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 Raynaud disease   . Fibromyalgia   . Inflammatory arthritis   . Osteopenia     Past Medical History:  Diagnosis Date  . Arthritis   . Fibromyalgia   . Macular degeneration    Left eye  . Osteopenia 05/2018   T score -2.3 FRAX 11% / 1.8% overall stable from prior DEXA  . Raynaud disease   . Reiters syndrome   . Sleep disturbance   . Vaginal dryness     Family History  Problem Relation Age of Onset  . Hypertension Sister   . Heart disease Brother   . Hyperlipidemia Brother   . Heart attack Brother   . Alzheimer's disease Mother   . Osteoarthritis Mother   .  High Cholesterol Sister   . Heart attack Brother   . Heart disease Brother   . Healthy Son   . Healthy Son    Past Surgical History:  Procedure Laterality Date  . BREAST SURGERY     LEFT BR LUMP-Benign  . COLONOSCOPY N/A 08/04/2018   Procedure: COLONOSCOPY;  Surgeon: Danie Binder, MD;  [facility];  Service: Endoscopy;  Laterality: N/A;  8:30  . POLYPECTOMY  08/04/2018   Procedure: POLYPECTOMY;  Surgeon: Danie Binder, MD;  Location: [facility];  Service: Endoscopy;;   Social History   Social History Narrative  . Not on file   Immunization History  Administered Date(s) Administered  . Influenza,inj,Quad PF,6+ Mos 08/24/2012, 09/25/2013, 11/13/2014, 11/17/2017     Objective: Vital Signs: There were no vitals taken for this visit.   Physical Exam   Musculoskeletal Exam: ***  CDAI Exam: CDAI Score: -- Patient Global: --; Provider Global: -- Swollen: --; Tender: -- Joint Exam 09/11/2020   No joint exam has been documented for this visit   There is currently no information documented on the homunculus. Go to the Rheumatology activity and complete the homunculus joint exam.  Investigation: No additional findings.  Imaging: No results found.  Recent Labs: Lab Results  Component Value Date   WBC 6.0 07/18/2020   HGB 13.6 07/18/2020   PLT 344 07/18/2020   NA 141 07/18/2020   K 3.9 07/18/2020   CL 103 07/18/2020   CO2 31 07/18/2020  GLUCOSE 96 07/18/2020   BUN 13 07/18/2020   CREATININE 0.86 07/18/2020   BILITOT 0.4 07/18/2020   ALKPHOS 84 10/10/2019   AST 21 07/18/2020   ALT 16 07/18/2020   PROT 6.9 07/18/2020   ALBUMIN 4.6 10/10/2019   CALCIUM 9.4 07/18/2020   GFRAA 83 07/18/2020   QFTBGOLDPLUS NEGATIVE 01/10/2020    Speciality Comments: TB gold neg 01/25/18 HIV, Hep, IG, SPEP neg 01/25/18  Procedures:  No procedures performed Allergies: Patient has no known allergies.   Assessment / Plan:     Visit Diagnoses: No diagnosis  found.  Orders: No orders of the defined types were placed in this encounter.  No orders of the defined types were placed in this encounter.   Face-to-face time spent with patient was *** minutes. Greater than 50% of time was spent in counseling and coordination of care.  Follow-Up Instructions: No follow-ups on file.   Earnestine Mealing, CMA  Note - This record has been created using Editor, commissioning.  Chart creation errors have been sought, but may not always  have been located. Such creation errors do not reflect on  the standard of medical care.

## 2020-09-10 ENCOUNTER — Encounter: Payer: Self-pay | Admitting: Physical Medicine and Rehabilitation

## 2020-09-10 ENCOUNTER — Ambulatory Visit: Payer: 59 | Admitting: Physical Medicine and Rehabilitation

## 2020-09-10 ENCOUNTER — Other Ambulatory Visit: Payer: Self-pay

## 2020-09-10 ENCOUNTER — Ambulatory Visit: Payer: Self-pay

## 2020-09-10 VITALS — BP 149/95 | HR 93

## 2020-09-10 DIAGNOSIS — M5416 Radiculopathy, lumbar region: Secondary | ICD-10-CM

## 2020-09-10 MED ORDER — METHYLPREDNISOLONE ACETATE 80 MG/ML IJ SUSP
80.0000 mg | Freq: Once | INTRAMUSCULAR | Status: AC
  Administered 2020-09-10: 80 mg

## 2020-09-10 NOTE — Progress Notes (Signed)
Pt state lower back pain that travels down her left leg to her ankle. Pt state sitting, walking and standing for a long period of time makes the pain worse.Pt state doing house work makes the pain worse. Pt state pain meds to help ease the pain.  Numeric Pain Rating Scale and Functional Assessment Average Pain 8   In the last MONTH (on 0-10 scale) has pain interfered with the following?  1. General activity like being  able to carry out your everyday physical activities such as walking, climbing stairs, carrying groceries, or moving a chair?  Rating(10)   +Driver, -BT, -Dye Allergies.

## 2020-09-11 ENCOUNTER — Ambulatory Visit: Payer: 59 | Admitting: Rheumatology

## 2020-09-11 DIAGNOSIS — Z8679 Personal history of other diseases of the circulatory system: Secondary | ICD-10-CM

## 2020-09-11 DIAGNOSIS — G8929 Other chronic pain: Secondary | ICD-10-CM

## 2020-09-11 DIAGNOSIS — M5410 Radiculopathy, site unspecified: Secondary | ICD-10-CM

## 2020-09-11 DIAGNOSIS — M1990 Unspecified osteoarthritis, unspecified site: Secondary | ICD-10-CM

## 2020-09-11 DIAGNOSIS — M8589 Other specified disorders of bone density and structure, multiple sites: Secondary | ICD-10-CM

## 2020-09-11 DIAGNOSIS — Z8669 Personal history of other diseases of the nervous system and sense organs: Secondary | ICD-10-CM

## 2020-09-11 DIAGNOSIS — Z79899 Other long term (current) drug therapy: Secondary | ICD-10-CM

## 2020-09-11 DIAGNOSIS — I7300 Raynaud's syndrome without gangrene: Secondary | ICD-10-CM

## 2020-09-11 DIAGNOSIS — M797 Fibromyalgia: Secondary | ICD-10-CM

## 2020-10-08 NOTE — Progress Notes (Addendum)
Office Visit Note  Patient: Monica [name]             Date of Birth: 1956/07/10           MRN: [id number]             PCP: Lemmie Evens, MD Referring: Lemmie Evens, MD Visit Date: 10/22/2020 Occupation: @GUAROCC @  Subjective:  Low back pain   History of Present Illness: Monica [name] is a 64 y.o. female with history of inflammatory arthritis, iritis, and fibromyalgia. She is on humira 40 mg sq injections once every 14 days and sulfasalazine 500 mg 2 tablets by mouth twice daily. She is tolerating both medications and has not missed any doses recently. She denies any recent flares. She had a an epidural injection performed on 09/10/2020 which provided significant pain relief. Over the past 1 week she has started to have some nocturnal pain. She has been taking Tylenol as needed and using Voltaren gel topically as needed for pain relief. She denies any symptoms of radiculopathy. She has occasional discomfort in her left knee joint which she attributes to gait changes when her lower back is causing discomfort. She denies any joint swelling. She denies any recent iritis flares. She has had some increased eye dryness and uses eyedrops eyedrops as needed. She denies any recent infections.    Activities of Daily Living:  Patient reports morning stiffness for 30-60 minutes.   Patient Reports nocturnal pain.  Difficulty dressing/grooming: Denies Difficulty climbing stairs: Denies Difficulty getting out of chair: Denies Difficulty using hands for taps, buttons, cutlery, and/or writing: Reports  Review of Systems  Constitutional: Negative for fatigue.  HENT: Negative for mouth sores, mouth dryness and nose dryness.   Eyes: Negative for pain, itching, visual disturbance and dryness.  Respiratory: Negative for cough, hemoptysis, shortness of breath and difficulty breathing.   Cardiovascular: Negative for chest pain, palpitations and swelling in legs/feet.  Gastrointestinal: Negative  for abdominal pain, blood in stool, constipation and diarrhea.  Endocrine: Negative for increased urination.  Genitourinary: Negative for painful urination.  Musculoskeletal: Positive for arthralgias, joint pain, myalgias, morning stiffness and myalgias. Negative for joint swelling, muscle weakness and muscle tenderness.  Skin: Negative for color change, rash and redness.  Allergic/Immunologic: Negative for susceptible to infections.  Neurological: Positive for numbness. Negative for dizziness, headaches, memory loss and weakness.  Hematological: Negative for swollen glands.  Psychiatric/Behavioral: Negative for confusion and sleep disturbance.    PMFS History:  Patient Active Problem List   Diagnosis Date Noted  . Special screening for malignant neoplasms, colon   . High risk medication use 01/13/2017  . History of iritis 01/13/2017  . Essential hypertension 01/13/2017  . Macular degeneration 01/13/2017  . 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 Leg weakness 07/08/2014  . Difficulty walking 07/08/2014  . Vaginal dryness   . 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 Raynaud disease   . Fibromyalgia   . Inflammatory arthritis   . Osteopenia     Past Medical History:  Diagnosis Date  . Arthritis   . Fibromyalgia   . Macular degeneration    Left eye  . Osteopenia 05/2018   T score -2.3 FRAX 11% / 1.8% overall stable from prior DEXA  . Raynaud disease   . Reiters syndrome   . Sleep disturbance   . Vaginal dryness     Family History  Problem Relation Age of Onset  . Hypertension Sister   . Heart disease  Brother   . Hyperlipidemia Brother   . Heart attack Brother   . Alzheimer's disease Mother   . Osteoarthritis Mother   . High Cholesterol Sister   . Heart attack Brother   . Heart disease Brother   . Healthy Son   . Healthy Son    Past Surgical History:  Procedure Laterality Date  . BREAST SURGERY     LEFT BR LUMP-Benign  . COLONOSCOPY  N/A 08/04/2018   Procedure: COLONOSCOPY;  Surgeon: West BaliFields, Sandi L, MD;  [facility];  Service: Endoscopy;  Laterality: N/A;  8:30  . POLYPECTOMY  08/04/2018   Procedure: POLYPECTOMY;  Surgeon: West BaliFields, Sandi L, MD;  [facility];  Service: Endoscopy;;   Social History   Social History Narrative  . Not on file   Immunization History  Administered Date(s) Administered  . Influenza,inj,Quad PF,6+ Mos 08/24/2012, 09/25/2013, 11/13/2014, 11/17/2017  . Moderna Sars-Covid-2 Vaccination 12/05/2019, 01/02/2020, 08/30/2020     Objective: Vital Signs: BP (!) 142/93 (BP Location: Left Arm, Patient Position: Sitting, Cuff Size: Normal)   Pulse 81   Ht 5\' 4"  (1.626 m)   Wt 128 lb 12.8 oz (58.4 kg)   BMI 22.11 kg/m    Physical Exam Vitals and nursing note reviewed.  Constitutional:      Appearance: She is well-developed and well-nourished.  HENT:     Head: Normocephalic and atraumatic.  Eyes:     Extraocular Movements: EOM normal.     Conjunctiva/sclera: Conjunctivae normal.  Cardiovascular:     Pulses: Intact distal pulses.  Pulmonary:     Effort: Pulmonary effort is normal.  Abdominal:     Palpations: Abdomen is soft.  Musculoskeletal:     Cervical back: Normal range of motion.  Skin:    General: Skin is warm and dry.     Capillary Refill: Capillary refill takes less than 2 seconds.  Neurological:     Mental Status: She is alert and oriented to person, place, and time.  Psychiatric:        Mood and Affect: Mood and affect normal.        Behavior: Behavior normal.      Musculoskeletal Exam: C-spine, thoracic spine, lumbar spine have good range of motion with no discomfort. Some midline spinal tenderness in the lumbar region noted. No SI joint tenderness. Shoulder joints, elbow joints, wrist joints, MCPs, PIPs, DIPs have good range of motion with no synovitis.  Complete fist formation bilaterally. 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 Hip joints have good range of motion without discomfort. No tenderness over trochanteric bursa bilaterally. Knee joints have good range of motion with no warmth or effusion. Ankle joints have good range of motion with no tenderness or inflammation. No tenderness along the Achilles tendons bilaterally.   CDAI Exam: CDAI Score: -- Patient Global: --; Provider Global: -- Swollen: --; Tender: -- Joint Exam 10/22/2020   No joint exam has been documented for this visit   There is currently no information documented on the homunculus. Go to the Rheumatology activity and complete the homunculus joint exam.  Investigation: No additional findings.  Imaging: No results found.  Recent Labs: Lab Results  Component Value Date   WBC 6.0 07/18/2020   HGB 13.6 07/18/2020   PLT 344 07/18/2020   NA 141 07/18/2020   K 3.9 07/18/2020   CL 103 07/18/2020   CO2 31 07/18/2020   GLUCOSE 96 07/18/2020   BUN 13 07/18/2020  CREATININE 0.86 07/18/2020   BILITOT 0.4 07/18/2020   ALKPHOS 84 10/10/2019   AST 21 07/18/2020   ALT 16 07/18/2020   PROT 6.9 07/18/2020   ALBUMIN 4.6 10/10/2019   CALCIUM 9.4 07/18/2020   GFRAA 83 07/18/2020   QFTBGOLDPLUS NEGATIVE 01/10/2020    Speciality Comments: TB gold neg 01/25/18 HIV, Hep, IG, SPEP neg 01/25/18  Procedures:  No procedures performed Allergies: Patient has no known allergies.   Assessment / Plan:     Visit Diagnoses: Inflammatory arthritis: She has no joint tenderness or synovitis on examination today. She is clinically doing well on Humira 40 mg subcu injections every 14 days and sulfasalazine 500 mg 2 tablets by mouth twice daily. She is tolerating both medications and has not missed any doses recently. Her lower back pain has improved significantly since having an epidural injection performed on 09/10/2020. She is not having any symptoms of radiculopathy at this time. She has started to have some increased nocturnal pain in her lower  back which she attributes to cleaning up Christmas decorations recently. She has been taking Tylenol as needed and using Voltaren gel topically for pain relief. Refill Voltaren gel will be sent to the pharmacy today. She will continue on the current treatment regimen. She was advised to notify us if she develops increased joint pain or joint swelling. She will follow-up in the office in 5 months.  High risk medication use - Humira 40 mg every 14 days and sulfasalazine 500 mg 2 tablets twice daily. CBC and CMP updated on 07/18/20.  She is due to update CBC and CMP today in the office.  Orders for CBC and CMP were released.  Her next lab work will be due in April and every 3 months to monitor for drug toxicity.  Standing orders for CBC and CMP are in place. TB gold negative on 01/10/20 and will continue to be monitored yearly.  Future order for TB gold placed today.  She has not had any recent infections. We discussed the importance of holding Humira and sulfasalazine if she develops signs or symptoms of an infection and to resume once the infection has completely cleared. She has received all 3 Moderna COVID-19 vaccinations.- Plan: CBC with Differential/Platelet, COMPLETE METABOLIC PANEL WITH GFR, QuantiFERON-TB Gold Plus Discussed the importance of yearly skin exams while on Humira.  She plans on following up with her dermatologist.  Screening for tuberculosis -Future order for TB Gold placed today.  Plan: QuantiFERON-TB Gold Plus  History of iritis: She has not had any recent iritis flares.  She has noted some increased eye dryness and uses Xiidra eyedrops as needed for symptomatic relief.  She has not had any eye pain, redness, or photophobia recently.  Chronic left SI joint pain: Resolved.  Raynaud's disease without gangrene: Not currently active.  Fibromyalgia: She experiences occasional myalgias and muscle tenderness due to fibromyalgia.  She is currently experiencing left trapezius muscle tension  and muscle tenderness.  We discussed the importance of massage, heat, and stretching exercises.  Osteopenia of multiple sites - DEXA 05/16/2018 AP spine BMD 0.799 with T score -2.3.  Patient will have repeat DEXA with her GYN.  Spondylosis of lumbar region without myelopathy or radiculopathy-MRI of lumbar spine on 03/07/20 revealed L4-L5 broad based disc bulge and L5-S1 mild broad-based bulge with small left foraminal disc protrusion.  She was referred to Dr. Otelia Sergeant at that time.  She underwent an epidural injection performed on 09/10/2020 which has provided significant relief.  This  past week she has started to have some increased nocturnal pain and has been taking Tylenol as needed as well as using Voltaren gel topically for pain relief.  She was strongly encouraged to continue to perform home exercises on a daily basis.  If her discomfort persists or worsens she will be following up with Dr. Louanne Skye for further evaluation and management.  Other medical conditions are listed as follows:  History of hypertension  History of macular degeneration    Orders: Orders Placed This Encounter  Procedures  . CBC with Differential/Platelet  . COMPLETE METABOLIC PANEL WITH GFR  . QuantiFERON-TB Gold Plus   Meds ordered this encounter  Medications  . sulfaSALAzine (AZULFIDINE) 500 MG EC tablet    Sig: TAKE (2) TABLETS BY MOUTH TWICE DAILY.    Dispense:  360 tablet    Refill:  0      Follow-Up Instructions: Return in about 5 months (around 03/22/2021) for Inflammatory arthritis, iritis, Fibromyalgia.   Ofilia Neas, PA-C  Note - This record has been created using Dragon software.  Chart creation errors have been sought, but may not always  have been located. Such creation errors do not reflect on  the standard of medical care.

## 2020-10-22 ENCOUNTER — Encounter: Payer: Self-pay | Admitting: Physician Assistant

## 2020-10-22 ENCOUNTER — Other Ambulatory Visit: Payer: Self-pay

## 2020-10-22 ENCOUNTER — Ambulatory Visit: Payer: 59 | Admitting: Physician Assistant

## 2020-10-22 VITALS — BP 142/93 | HR 81 | Ht 64.0 in | Wt 128.8 lb

## 2020-10-22 DIAGNOSIS — Z79899 Other long term (current) drug therapy: Secondary | ICD-10-CM

## 2020-10-22 DIAGNOSIS — M47816 Spondylosis without myelopathy or radiculopathy, lumbar region: Secondary | ICD-10-CM

## 2020-10-22 DIAGNOSIS — M8589 Other specified disorders of bone density and structure, multiple sites: Secondary | ICD-10-CM

## 2020-10-22 DIAGNOSIS — Z8679 Personal history of other diseases of the circulatory system: Secondary | ICD-10-CM

## 2020-10-22 DIAGNOSIS — M533 Sacrococcygeal disorders, not elsewhere classified: Secondary | ICD-10-CM

## 2020-10-22 DIAGNOSIS — Z111 Encounter for screening for respiratory tuberculosis: Secondary | ICD-10-CM

## 2020-10-22 DIAGNOSIS — M797 Fibromyalgia: Secondary | ICD-10-CM

## 2020-10-22 DIAGNOSIS — Z8669 Personal history of other diseases of the nervous system and sense organs: Secondary | ICD-10-CM | POA: Diagnosis not present

## 2020-10-22 DIAGNOSIS — G8929 Other chronic pain: Secondary | ICD-10-CM

## 2020-10-22 DIAGNOSIS — M5136 Other intervertebral disc degeneration, lumbar region: Secondary | ICD-10-CM

## 2020-10-22 DIAGNOSIS — M51369 Other intervertebral disc degeneration, lumbar region without mention of lumbar back pain or lower extremity pain: Secondary | ICD-10-CM

## 2020-10-22 DIAGNOSIS — I7300 Raynaud's syndrome without gangrene: Secondary | ICD-10-CM

## 2020-10-22 DIAGNOSIS — M5126 Other intervertebral disc displacement, lumbar region: Secondary | ICD-10-CM

## 2020-10-22 DIAGNOSIS — M1990 Unspecified osteoarthritis, unspecified site: Secondary | ICD-10-CM | POA: Diagnosis not present

## 2020-10-22 MED ORDER — DICLOFENAC SODIUM 1 % EX GEL: CUTANEOUS | 2 refills | Status: DC

## 2020-10-22 MED ORDER — SULFASALAZINE 500 MG PO TBEC
DELAYED_RELEASE_TABLET | ORAL | 0 refills | Status: DC
Start: 2020-10-22 — End: 2022-03-24

## 2020-10-22 NOTE — Addendum Note (Signed)
Addended by: Francis Gaines C on: 10/22/2020 03:00 PM   Modules accepted: Orders

## 2020-10-22 NOTE — Patient Instructions (Signed)
Standing Labs We placed an order today for your standing lab work.   Please have your standing labs drawn in April and every 3 months   If possible, please have your labs drawn 2 weeks prior to your appointment so that the provider can discuss your results at your appointment.  We have open lab daily Monday through Thursday from 8:30-12:30 PM and 1:30-4:30 PM and Friday from 8:30-12:30 PM and 1:30-4:00 PM at the office of Dr. Shaili Deveshwar, [facility].   Please be advised, patients with office appointments requiring lab work will take precedents over walk-in lab work.  If possible, please come for your lab work on Monday and Friday afternoons, as you may experience shorter wait times. The office is located at [address], [address], [place], [place] [address] No appointment is necessary.   Labs are drawn by [facility]. Please bring your co-pay at the time of your lab draw.  You may receive a bill from [facility] for your lab work.  If you wish to have your labs drawn at another location, please call the office 24 hours in advance to send orders.  If you have any questions regarding directions or hours of operation,  please call [phone].   As a reminder, please drink plenty of water prior to coming for your lab work. Thanks!   

## 2020-10-23 LAB — COMPLETE METABOLIC PANEL WITH GFR
AG Ratio: 1.6 (calc) (ref 1.0–2.5)
ALT: 20 U/L (ref 6–29)
AST: 23 U/L (ref 10–35)
Albumin: 4.4 g/dL (ref 3.6–5.1)
Alkaline phosphatase (APISO): 72 U/L (ref 37–153)
BUN: 12 mg/dL (ref 7–25)
CO2: 28 mmol/L (ref 20–32)
Calcium: 9.7 mg/dL (ref 8.6–10.4)
Chloride: 103 mmol/L (ref 98–110)
Creat: 0.85 mg/dL (ref 0.50–0.99)
GFR, Est African American: 84 mL/min/{1.73_m2} (ref >=60)
GFR, Est Non African American: 72 mL/min/{1.73_m2} (ref >=60)
Globulin: 2.7 g/dL (calc) (ref 1.9–3.7)
Glucose, Bld: 87 mg/dL (ref 65–99)
Potassium: 4.4 mmol/L (ref 3.5–5.3)
Sodium: 140 mmol/L (ref 135–146)
Total Bilirubin: 0.3 mg/dL (ref 0.2–1.2)
Total Protein: 7.1 g/dL (ref 6.1–8.1)

## 2020-10-23 LAB — CBC WITH DIFFERENTIAL/PLATELET
Absolute Monocytes: 549 cells/uL (ref 200–950)
Basophils Absolute: 31 cells/uL (ref 0–200)
Basophils Relative: 0.5 %
Eosinophils Absolute: 110 cells/uL (ref 15–500)
Eosinophils Relative: 1.8 %
HCT: 41.7 % (ref 35.0–45.0)
Hemoglobin: 13.7 g/dL (ref 11.7–15.5)
Lymphs Abs: 2214 cells/uL (ref 850–3900)
MCH: 30.4 pg (ref 27.0–33.0)
MCHC: 32.9 g/dL (ref 32.0–36.0)
MCV: 92.7 fL (ref 80.0–100.0)
MPV: 9.1 fL (ref 7.5–12.5)
Monocytes Relative: 9 %
Neutro Abs: 3196 cells/uL (ref 1500–7800)
Neutrophils Relative %: 52.4 %
Platelets: 338 10*3/uL (ref 140–400)
RBC: 4.5 10*6/uL (ref 3.80–5.10)
RDW: 11.8 % (ref 11.0–15.0)
Total Lymphocyte: 36.3 %
WBC: 6.1 10*3/uL (ref 3.8–10.8)

## 2020-10-23 NOTE — Progress Notes (Signed)
CBC and CMP WNL

## 2020-11-15 ENCOUNTER — Other Ambulatory Visit: Payer: Self-pay | Admitting: Rheumatology

## 2020-11-16 NOTE — Progress Notes (Signed)
Monica [name] - 65 y.o. female MRN [id number]  Date of birth: 1956-08-23  Office Visit Note: Visit Date: 09/10/2020 PCP: Lemmie Evens, MD Referred by: Lemmie Evens, MD  Subjective: Chief Complaint  Patient presents with  . Lower Back - Pain  . Left Ankle - Pain  . Left Leg - Pain   HPI:  Monica [name] is a 65 y.o. female who comes in today at the request of Dr. Basil Dess for planned Left L5-S1 Lumbar epidural steroid injection with fluoroscopic guidance.  The patient has failed conservative care including home exercise, medications, time and activity modification.  This injection will be diagnostic and hopefully therapeutic.  Please see requesting physician notes for further details and justification.   ROS Otherwise per HPI.  Assessment & Plan: Visit Diagnoses:    ICD-10-CM   1. Lumbar radiculopathy  M54.16 XR C-ARM NO REPORT    Epidural Steroid injection    methylPREDNISolone acetate (DEPO-MEDROL) injection 80 mg    Plan: No additional findings.   Meds & Orders:  Meds ordered this encounter  Medications  . methylPREDNISolone acetate (DEPO-MEDROL) injection 80 mg    Orders Placed This Encounter  Procedures  . XR C-ARM NO REPORT  . Epidural Steroid injection    Follow-up: Return for visit to requesting physician as needed.   Procedures: No procedures performed  Lumbosacral Transforaminal Epidural Steroid Injection - Sub-Pedicular Approach with Fluoroscopic Guidance  Patient: Monica [name]      Date of Birth: 02/21/1956 MRN: [id number] PCP: Lemmie Evens, MD      Visit Date: 09/10/2020   Universal Protocol:    Date/Time: 09/10/2020  Consent Given By: the patient  Position: PRONE  Additional Comments: Vital signs were monitored before and after the procedure. Patient was prepped and draped in the usual sterile fashion. The correct patient, procedure, and site was verified.   Injection Procedure Details:   Procedure diagnoses: Lumbar  radiculopathy [M54.16]    Meds Administered:  Meds ordered this encounter  Medications  . methylPREDNISolone acetate (DEPO-MEDROL) injection 80 mg    Laterality: Left  Location/Site:  L5-S1  Needle:5.0 in., 22 ga.  Short bevel or Quincke spinal needle  Needle Placement: Transforaminal  Findings:    -Comments: Excellent flow of contrast along the nerve, nerve root and into the epidural space.  Procedure Details: After squaring off the end-plates to get a true AP view, the C-arm was positioned so that an oblique view of the foramen as noted above was visualized. The target area is just inferior to the "nose of the scotty dog" or sub pedicular. The soft tissues overlying this structure were infiltrated with 2-3 ml. of 1% Lidocaine without Epinephrine.  The spinal needle was inserted toward the target using a "trajectory" view along the fluoroscope beam.  Under AP and lateral visualization, the needle was advanced so it did not puncture dura and was located close the 6 O'Clock position of the pedical in AP tracterory. Biplanar projections were used to confirm position. Aspiration was confirmed to be negative for CSF and/or blood. A 1-2 ml. volume of Isovue-250 was injected and flow of contrast was noted at each level. Radiographs were obtained for documentation purposes.   After attaining the desired flow of contrast documented above, a 0.5 to 1.0 ml test dose of 0.25% Marcaine was injected into each respective transforaminal space.  The patient was observed for 90 seconds post injection.  After no sensory deficits were reported, and normal lower extremity motor function  was noted,   the above injectate was administered so that equal amounts of the injectate were placed at each foramen (level) into the transforaminal epidural space.   Additional Comments:  The patient tolerated the procedure well Dressing: 2 x 2 sterile gauze and Band-Aid    Post-procedure details: Patient was observed  during the procedure. Post-procedure instructions were reviewed.  Patient left the clinic in stable condition.      Clinical History: Lspine MRI 10/26/2016 L4-L5: Mild broad-based disc bulge with a a right foraminal shallow disc protrusion. No evidence of neural foraminal stenosis. No central canal stenosis.   L5-S1: Broad-based disc bulge with a small left foraminal/lateral disc protrusion abutting the left extraforaminal L5 nerve root. Mild bilateral facet arthropathy. No evidence of neural foraminal stenosis. No central canal stenosis.   IMPRESSION: 1. At L5-S1 there is a broad-based disc bulge with a small left foraminal/lateral disc protrusion abutting the left extraforaminal L5 nerve root. 2. At L4-5 there is a mild broad-based disc bulge with a a right foraminal shallow disc protrusion.     Objective:  VS:  HT:    WT:   BMI:     BP:(!) 149/95  HR:93bpm  TEMP: ( )  RESP:  Physical Exam Vitals and nursing note reviewed.  Constitutional:      General: She is not in acute distress.    Appearance: Normal appearance. She is not ill-appearing.  HENT:     Head: Normocephalic and atraumatic.     Right Ear: External ear normal.     Left Ear: External ear normal.  Eyes:     Extraocular Movements: Extraocular movements intact.  Cardiovascular:     Rate and Rhythm: Normal rate.     Pulses: Normal pulses.  Pulmonary:     Effort: Pulmonary effort is normal. No respiratory distress.  Abdominal:     General: There is no distension.     Palpations: Abdomen is soft.  Musculoskeletal:        General: Tenderness present.     Cervical back: Neck supple.     Right lower leg: No edema.     Left lower leg: No edema.     Comments: Patient has good distal strength with no pain over the greater trochanters.  No clonus or focal weakness.  Skin:    Findings: No erythema, lesion or rash.  Neurological:     General: No focal deficit present.     Mental Status: She is alert and  oriented to person, place, and time.     Sensory: No sensory deficit.     Motor: No weakness or abnormal muscle tone.     Coordination: Coordination normal.  Psychiatric:        Mood and Affect: Mood normal.        Behavior: Behavior normal.      Imaging: No results found.

## 2020-11-16 NOTE — Procedures (Signed)
Lumbosacral Transforaminal Epidural Steroid Injection - Sub-Pedicular Approach with Fluoroscopic Guidance  Patient: Monica [name]      Date of Birth: Oct 14, 1955 MRN: [id number] PCP: Lemmie Evens, MD      Visit Date: 09/10/2020   Universal Protocol:    Date/Time: 09/10/2020  Consent Given By: the patient  Position: PRONE  Additional Comments: Vital signs were monitored before and after the procedure. Patient was prepped and draped in the usual sterile fashion. The correct patient, procedure, and site was verified.   Injection Procedure Details:   Procedure diagnoses: Lumbar radiculopathy [M54.16]    Meds Administered:  Meds ordered this encounter  Medications  . methylPREDNISolone acetate (DEPO-MEDROL) injection 80 mg    Laterality: Left  Location/Site:  L5-S1  Needle:5.0 in., 22 ga.  Short bevel or Quincke spinal needle  Needle Placement: Transforaminal  Findings:    -Comments: Excellent flow of contrast along the nerve, nerve root and into the epidural space.  Procedure Details: After squaring off the end-plates to get a true AP view, the C-arm was positioned so that an oblique view of the foramen as noted above was visualized. The target area is just inferior to the "nose of the scotty dog" or sub pedicular. The soft tissues overlying this structure were infiltrated with 2-3 ml. of 1% Lidocaine without Epinephrine.  The spinal needle was inserted toward the target using a "trajectory" view along the fluoroscope beam.  Under AP and lateral visualization, the needle was advanced so it did not puncture dura and was located close the 6 O'Clock position of the pedical in AP tracterory. Biplanar projections were used to confirm position. Aspiration was confirmed to be negative for CSF and/or blood. A 1-2 ml. volume of Isovue-250 was injected and flow of contrast was noted at each level. Radiographs were obtained for documentation purposes.   After attaining the  desired flow of contrast documented above, a 0.5 to 1.0 ml test dose of 0.25% Marcaine was injected into each respective transforaminal space.  The patient was observed for 90 seconds post injection.  After no sensory deficits were reported, and normal lower extremity motor function was noted,   the above injectate was administered so that equal amounts of the injectate were placed at each foramen (level) into the transforaminal epidural space.   Additional Comments:  The patient tolerated the procedure well Dressing: 2 x 2 sterile gauze and Band-Aid    Post-procedure details: Patient was observed during the procedure. Post-procedure instructions were reviewed.  Patient left the clinic in stable condition.

## 2020-11-17 NOTE — Telephone Encounter (Signed)
Last Visit: 10/22/2020 Next Visit: 04/22/2021  Current Dose per office note on 10/22/2020, not discussed Dx: Inflammatory arthritis  Okay to refill FABB?

## 2020-12-08 ENCOUNTER — Other Ambulatory Visit (HOSPITAL_COMMUNITY): Payer: Self-pay | Admitting: Family Medicine

## 2020-12-08 DIAGNOSIS — Z1231 Encounter for screening mammogram for malignant neoplasm of breast: Secondary | ICD-10-CM

## 2020-12-09 ENCOUNTER — Other Ambulatory Visit: Payer: Self-pay | Admitting: Physician Assistant

## 2020-12-09 NOTE — Telephone Encounter (Signed)
Last Visit: 10/22/2020 Next Visit: 04/22/2021 Labs: 10/22/2020, CBC and CMP WNL TB Gold: 01/10/2020, negative  Current Dose per office note 10/22/2020, Humira 40 mg every 14 days   OO:[id number] arthritis  Last Fill: 05/31/2020   Okay to refill Humira?

## 2020-12-10 ENCOUNTER — Other Ambulatory Visit: Payer: Self-pay

## 2020-12-10 ENCOUNTER — Ambulatory Visit (INDEPENDENT_AMBULATORY_CARE_PROVIDER_SITE_OTHER): Payer: 59 | Admitting: Nurse Practitioner

## 2020-12-10 ENCOUNTER — Ambulatory Visit: Payer: 59 | Admitting: Nurse Practitioner

## 2020-12-10 ENCOUNTER — Encounter: Payer: Self-pay | Admitting: Nurse Practitioner

## 2020-12-10 VITALS — BP 148/92 | HR 94 | Resp 12 | Ht 64.0 in | Wt 131.0 lb

## 2020-12-10 DIAGNOSIS — N760 Acute vaginitis: Secondary | ICD-10-CM | POA: Diagnosis not present

## 2020-12-10 DIAGNOSIS — L900 Lichen sclerosus et atrophicus: Secondary | ICD-10-CM

## 2020-12-10 LAB — URINALYSIS, COMPLETE W/RFL CULTURE
Bacteria, UA: NONE SEEN /HPF
Bilirubin Urine: NEGATIVE
Glucose, UA: NEGATIVE
Hgb urine dipstick: NEGATIVE
Hyaline Cast: NONE SEEN /LPF
Ketones, ur: NEGATIVE
Leukocyte Esterase: NEGATIVE
Nitrites, Initial: NEGATIVE
Protein, ur: NEGATIVE
RBC / HPF: NONE SEEN /HPF (ref 0–2)
Specific Gravity, Urine: 1.01 (ref 1.001–1.03)
WBC, UA: NONE SEEN /HPF (ref 0–5)
pH: 6.5 (ref 5.0–8.0)

## 2020-12-10 LAB — WET PREP FOR TRICH, YEAST, CLUE

## 2020-12-10 LAB — NO CULTURE INDICATED

## 2020-12-10 MED ORDER — CLOBETASOL PROPIONATE 0.05 % EX OINT: TOPICAL_OINTMENT | CUTANEOUS | 2 refills | Status: DC

## 2020-12-10 NOTE — Patient Instructions (Signed)
Lichen Sclerosus Lichen sclerosus is a skin problem. It can happen on any part of the body, but it commonly involves the anal and genital areas. It can cause itching and discomfort in these areas. Treatment can help to control symptoms. When the genital area is affected, getting treatment is important because the condition can cause scarring that may lead to other problems if left untreated. What are the causes? The cause of this condition is not known. It may be related to an overactive immune system or a lack of certain hormones. Lichen sclerosus is not an infection or a fungus, and it is not passed from one person to another (non-contagious). What increases the risk? The following factors may make you more likely to develop this condition:  You are a woman who has reached menopause.  You are a man who was not circumcised. This condition may also develop for the first time in children, usually before they enter puberty. What are the signs or symptoms? Symptoms of this condition include:  White areas (plaques) on the skin that may be thin and wrinkled, or thickened.  Red and swollen patches (lesions) on the skin.  Tears or cracks in the skin.  Bruising.  Blood blisters.  Severe itching.  Pain, itching, or burning when urinating. Constipation is also common in children with lichen sclerosus, but can be seen in adults.   How is this diagnosed? This condition may be diagnosed with a physical exam. In some cases, a tissue sample may be removed to be checked under a microscope (biopsy). How is this treated? This condition may be treated with:  Topical steroids. These are medicated creams or ointments that are applied over the affected areas.  Medicines that are taken by mouth.  Topical immunotherapy. These are medicated creams or ointments that are applied over the affected areas. They stimulate your immune system to fight the skin condition. This may be used if steroids are not  effective.  Surgery. This may be needed in more severe cases that are causing problems such as scarring. Follow these instructions at home: Medicines  Take over-the-counter and prescription medicines only as told by your health care provider.  Use creams or ointments as told by your health care provider. Skin care  Do not scratch the affected areas of skin.  If you are a woman, be sure to keep the vaginal area as clean and dry as possible.  Clean the affected area of skin gently with water only. Pat skin dry and avoid the use of rough towels or toilet paper.  Avoid irritating skin products, including soap and scented lotions. Use emollient creams as directed by your health care provider to help reduce itching. General instructions  Keep all follow-up visits. This is important.  Your condition may cause constipation. To prevent or treat constipation, you may need to: ? Drink enough fluid to keep your urine pale yellow. ? Take over-the-counter or prescription medicines. ? Eat foods that are high in fiber, such as beans, whole grains, and fresh fruits and vegetables. ? Limit foods that are high in fat and processed sugars, such as fried or sweet foods. Contact a health care provider if:  You have increasing redness, swelling, or pain in the affected area.  You have fluid, blood, or pus coming from the affected area.  You have new lesions on your skin.  You have a fever.  You have pain during sex. Get help right away if:  You develop severe pain or burning in  the affected areas, especially in the genital area. Summary  Lichen sclerosus is a skin problem. When the genital area is affected, getting treatment is important because the condition can cause scarring that may lead to other problems if left untreated.  This condition is usually treated with medicated creams or ointments (topical steroids) that are applied over the affected areas.  Take or use over-the-counter and  prescription medicines only as told by your health care provider.  Contact a health care provider if you have new lesions on your skin, have pain during sex, or have increasing redness, swelling, or pain in the affected area.  Keep all follow-up visits. This is important. This information is not intended to replace advice given to you by your health care provider. Make sure you discuss any questions you have with your health care provider. Document Revised: 02/09/2020 Document Reviewed: 02/09/2020 Elsevier Patient Education  2021 [employer] Inc.  

## 2020-12-10 NOTE — Progress Notes (Signed)
Duplicate encounter for 12-10-20.

## 2020-12-10 NOTE — Progress Notes (Signed)
   Acute Office Visit  Subjective:    Patient ID: Monica [name], female    DOB: 09-05-1956, 65 y.o.   MRN: [id number]   HPI 65 y.o. presents today for vaginal itching x 1 month. Denies odor, discharge, or urinary symptoms.   Review of Systems  Constitutional: Negative.   Genitourinary: Positive for dyspareunia and vaginal pain (pain, itching). Negative for dysuria, frequency, urgency and vaginal discharge.       Objective:    Physical Exam Constitutional:      Appearance: Normal appearance.  Genitourinary:    Vagina: Normal.     Cervix: Normal.       BP (!) 148/92 (BP Location: Left Arm, Patient Position: Sitting, Cuff Size: Normal)   Pulse 94   Resp 12   Ht 5\' 4"  (1.626 m)   Wt 131 lb (59.4 kg)   BMI 22.49 kg/m  Wt Readings from Last 3 Encounters:  12/10/20 131 lb (59.4 kg)  12/10/20 131 lb (59.4 kg)  10/22/20 128 lb 12.8 oz (58.4 kg)   Wet prep negative UA negative     Assessment & Plan:   Problem List Items Addressed This Visit   None   Visit Diagnoses    Lichen sclerosus    -  Primary   Relevant Medications   clobetasol ointment (TEMOVATE) 0.05 %   Acute vaginitis       Relevant Orders   Urinalysis,Complete w/RFL Culture (Completed)   WET PREP FOR [place], YEAST, CLUE     Plan: Normal wet prep and UA. Exam consistent with lichen sclerosis. We discussed LC and management-Clobetasol twice daily x 2 weeks, then daily x 2 weeks, then every other day x 2 weeks, then twice weekly for maintenance. If symptoms do not improve we discussed returning for biopsy. She is agreeable to plan.      Tamela Gammon [facility], 3:37 PM 12/10/2020

## 2020-12-26 ENCOUNTER — Ambulatory Visit (HOSPITAL_COMMUNITY)
Admission: RE | Admit: 2020-12-26 | Discharge: 2020-12-26 | Disposition: A | Discharge status: Home / Self Care | Payer: 59 | Source: Ambulatory Visit | Attending: Family Medicine | Admitting: Family Medicine

## 2020-12-26 ENCOUNTER — Other Ambulatory Visit: Payer: Self-pay

## 2020-12-26 DIAGNOSIS — Z1231 Encounter for screening mammogram for malignant neoplasm of breast: Secondary | ICD-10-CM | POA: Diagnosis not present

## 2021-01-14 ENCOUNTER — Ambulatory Visit (HOSPITAL_COMMUNITY): Payer: 59 | Admitting: Physical Therapy

## 2021-01-16 ENCOUNTER — Ambulatory Visit (HOSPITAL_COMMUNITY): Payer: 59 | Admitting: Physical Therapy

## 2021-02-11 ENCOUNTER — Encounter: Payer: Self-pay | Admitting: Nurse Practitioner

## 2021-02-11 ENCOUNTER — Ambulatory Visit (INDEPENDENT_AMBULATORY_CARE_PROVIDER_SITE_OTHER): Payer: 59 | Admitting: Nurse Practitioner

## 2021-02-11 ENCOUNTER — Other Ambulatory Visit: Payer: Self-pay

## 2021-02-11 VITALS — BP 124/80 | Ht 64.0 in | Wt 127.0 lb

## 2021-02-11 DIAGNOSIS — M8589 Other specified disorders of bone density and structure, multiple sites: Secondary | ICD-10-CM

## 2021-02-11 DIAGNOSIS — L900 Lichen sclerosus et atrophicus: Secondary | ICD-10-CM

## 2021-02-11 DIAGNOSIS — Z780 Asymptomatic menopausal state: Secondary | ICD-10-CM | POA: Diagnosis not present

## 2021-02-11 DIAGNOSIS — Z01419 Encounter for gynecological examination (general) (routine) without abnormal findings: Secondary | ICD-10-CM | POA: Diagnosis not present

## 2021-02-11 NOTE — Patient Instructions (Signed)

## 2021-02-11 NOTE — Progress Notes (Signed)
   Monica [name] [birth year] [id number]   History:  65 y.o. [id number] presents for annual exam. Postmenopausal - no HRT, no bleeding. Started on Clobetasol 2 months ago for lichen sclerosus with much improvement. Normal pap and mammogram history. Osteopenia. On Humira for RA.   Gynecologic History No LMP recorded. Patient is postmenopausal.   Contraception/Family planning: post menopausal status  Health Maintenance Last Pap: 12/26/2017. Results were: normal Last mammogram: 12/26/2020. Results were: normal Last colonoscopy: 2019. Results were: polyps, 7-year recall Last Dexa: 05/16/2018. Results were: T-score -2.3, FRAX 11% / 1.8%  Past medical history, past surgical history, family history and social history were all reviewed and documented in the EPIC chart. Married. Retired.   ROS:  A ROS was performed and pertinent positives and negatives are included.  Exam:  Vitals:   02/11/21 1327  BP: 124/80  Weight: 127 lb (57.6 kg)  Height: 5\' 4"  (1.626 m)   Body mass index is 21.8 kg/m.  General appearance:  Normal Thyroid:  Symmetrical, normal in size, without palpable masses or nodularity. Respiratory  Auscultation:  Clear without wheezing or rhonchi Cardiovascular  Auscultation:  Regular rate, without rubs, murmurs or gallops  Edema/varicosities:  Not grossly evident Abdominal  Soft,nontender, without masses, guarding or rebound.  Liver/spleen:  No organomegaly noted  Hernia:  None appreciated  Skin  Inspection:  Grossly normal Breasts: Examined lying and sitting.   Right: Without masses, retractions, nipple discharge or axillary adenopathy.   Left: Without masses, retractions, nipple discharge or axillary adenopathy. Gentitourinary   Inguinal/mons:  Normal without inguinal adenopathy  External genitalia:  Thin, pearly appearance consistent with LS - much improved. No masses, tenderness, or lesions  BUS/Urethra/Skene's glands:  Normal  Vagina:  Normal appearing with normal  color and discharge, no lesions  Cervix:  Normal appearing without discharge or lesions  Uterus:  Normal in size, shape and contour.  Midline and mobile, nontender  Adnexa/parametria:     Rt: Normal in size, without masses or tenderness.   Lt: Normal in size, without masses or tenderness.  Anus and perineum: Normal  Digital rectal exam: Normal sphincter tone without palpated masses or tenderness  Assessment/Plan:  65 y.o. [id number] for annual exam.   Well female exam with routine gynecological exam - Education provided on SBEs, importance of preventative screenings, current guidelines, high calcium diet, regular exercise, and multivitamin daily. Will ask rheumatologist to add on Lipid panel at next visit since she is not fasting today.   Osteopenia of multiple sites - Plan: DG Bone Density - T-score -2.3, FRAX 11% / 1.8%. Continue daily Vitamin D supplement and regular exercise.  Postmenopausal - Plan: DG Bone Density. No HRT, no bleeding.   Lichen sclerosus - using Clobetasol with much improvement.    Screening for cervical cancer - Normal Pap history.  Will repeat at 5-year interval per guidelines.  Screening for breast cancer - Normal mammogram history.  Continue annual screenings.  Normal breast exam today.  Screening for colon cancer - 2019 colonoscopy. Will repeat at GI's recommended interval.   Return in 1 year for annual.    Tamela Gammon DNP, 1:49 PM 02/11/2021

## 2021-02-25 ENCOUNTER — Other Ambulatory Visit: Payer: Self-pay

## 2021-02-25 ENCOUNTER — Ambulatory Visit (INDEPENDENT_AMBULATORY_CARE_PROVIDER_SITE_OTHER): Payer: 59

## 2021-02-25 ENCOUNTER — Other Ambulatory Visit: Payer: Self-pay | Admitting: Nurse Practitioner

## 2021-02-25 DIAGNOSIS — M810 Age-related osteoporosis without current pathological fracture: Secondary | ICD-10-CM

## 2021-02-25 DIAGNOSIS — Z780 Asymptomatic menopausal state: Secondary | ICD-10-CM

## 2021-02-25 DIAGNOSIS — M8589 Other specified disorders of bone density and structure, multiple sites: Secondary | ICD-10-CM

## 2021-04-08 NOTE — Progress Notes (Signed)
Office Visit Note  Patient: Monica [name]             Date of Birth: 1955-11-17           MRN: [id number]             PCP: Lemmie Evens, MD Referring: Lemmie Evens, MD Visit Date: 04/22/2021 Occupation: @GUAROCC @  Subjective:  Medication monitoring.   History of Present Illness: Monica [name] is a 65 y.o. female with a history of inflammatory arthritis, iritis, fibromyalgia and degenerative disc disease.  She states she continues to have lower back pain and intermittent radiculopathy to her left lower extremity.  She is planning to have another injection by Dr. Ernestina Patches.  She had some relief after the last injection.  She states she likes to work in the garden.  After that she starts  experiencing increased lower back pain and discomfort in her hands.  She has not noticed any joint swelling recently.  She continues to have some discomfort from fibromyalgia.  Activities of Daily Living:  Patient reports morning stiffness for 30 minutes.   Patient Reports nocturnal pain.  Difficulty dressing/grooming: Denies Difficulty climbing stairs: Denies Difficulty getting out of chair: Reports Difficulty using hands for taps, buttons, cutlery, and/or writing: Reports  Review of Systems  Constitutional:  Negative for fatigue.  HENT:  Negative for mouth sores, mouth dryness and nose dryness.   Eyes:  Positive for visual disturbance. Negative for pain, itching and dryness.  Respiratory:  Negative for cough, hemoptysis, shortness of breath and difficulty breathing.   Cardiovascular:  Negative for chest pain, palpitations and swelling in legs/feet.  Gastrointestinal:  Negative for abdominal pain, blood in stool, constipation and diarrhea.  Endocrine: Negative for increased urination.  Genitourinary:  Negative for painful urination.  Musculoskeletal:  Positive for joint pain, joint pain and morning stiffness. Negative for joint swelling, myalgias, muscle weakness, muscle tenderness and  myalgias.  Skin:  Positive for color change. Negative for rash and redness.  Allergic/Immunologic: Negative for susceptible to infections.  Neurological:  Negative for dizziness, numbness, headaches, memory loss and weakness.  Hematological:  Negative for swollen glands.  Psychiatric/Behavioral:  Positive for sleep disturbance. Negative for confusion.    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[place])    Raynaud disease    Fibromyalgia    Inflammatory arthritis    Osteopenia     Past Medical History:  Diagnosis Date   Arthritis    Fibromyalgia    Macular degeneration    Left eye   Osteopenia 05/2018   T score -2.3 FRAX 11% / 1.8% overall stable from prior DEXA   RA (rheumatoid arthritis) (HCC)    Raynaud disease    Reiters syndrome    Sleep disturbance    Vaginal dryness     Family History  Problem Relation Age of Onset   Hypertension Sister    Heart disease Brother    Hyperlipidemia Brother    Heart attack Brother    Alzheimer's disease Mother    Osteoarthritis Mother    High Cholesterol Sister    Heart attack  Brother    Heart disease Brother    Healthy Son    Healthy Son    Past Surgical History:  Procedure Laterality Date   BREAST SURGERY     LEFT BR LUMP-Benign   COLONOSCOPY N/A 08/04/2018   Procedure: COLONOSCOPY;  Surgeon: Danie Binder, MD;  Location: [facility];  Service: Endoscopy;  Laterality: N/A;  8:30   POLYPECTOMY  08/04/2018   Procedure: POLYPECTOMY;  Surgeon: Danie Binder, MD;  Location: [facility];  Service: Endoscopy;;   Social History    Social History Narrative   Not on file   Immunization History  Administered Date(s) Administered   Influenza,inj,Quad PF,6+ Mos 08/24/2012, 09/25/2013, 11/13/2014, 11/17/2017   Moderna Sars-Covid-2 Vaccination 12/05/2019, 01/02/2020, 08/30/2020     Objective: Vital Signs: BP 128/85 (BP Location: Left Arm, Patient Position: Sitting, Cuff Size: Normal)   Pulse 84   Ht 5\' 4"  (1.626 m)   Wt 129 lb 9.6 oz (58.8 kg)   BMI 22.25 kg/m    Physical Exam Vitals and nursing note reviewed.  Constitutional:      Appearance: She is well-developed.  HENT:     Head: Normocephalic and atraumatic.  Eyes:     Conjunctiva/sclera: Conjunctivae normal.  Cardiovascular:     Rate and Rhythm: Normal rate and regular rhythm.     Heart sounds: Normal heart sounds.  Pulmonary:     Effort: Pulmonary effort is normal.     Breath sounds: Normal breath sounds.  Abdominal:     General: Bowel sounds are normal.     Palpations: Abdomen is soft.  Musculoskeletal:     Cervical back: Normal range of motion.  Lymphadenopathy:     Cervical: No cervical adenopathy.  Skin:    General: Skin is warm and dry.     Capillary Refill: Capillary refill takes less than 2 seconds.  Neurological:     Mental Status: She is alert and oriented to person, place, and time.  Psychiatric:        Behavior: Behavior normal.     Musculoskeletal Exam: C-spine was in good range of motion.  Shoulder joints, elbow joints, wrist joints, MCPs PIPs and DIPs with good range of motion.  She had bilateral PIP and DIP thickening with no synovitis.  Hip joints and knee joints with good range of motion without any swelling.  She had no tenderness over ankles or MTPs.  She had DIP and PIP thickening in her feet consistent with osteoarthritis.  She had discomfort range of motion of her lumbar spine with tenderness in the lower lumbar region.  CDAI Exam: CDAI Score: 0.4  Patient Global: 2 mm; Provider Global: 2 mm Swollen: 0 ; Tender: 0   Joint Exam 04/22/2021   No joint exam has been documented for this visit     Investigation: No additional findings.  Imaging: No results found.  Recent Labs: Lab Results  Component Value Date   WBC 6.1 10/22/2020   HGB 13.7 10/22/2020   PLT 338 10/22/2020   NA 140 10/22/2020   K 4.4 10/22/2020   CL 103 10/22/2020   CO2 28 10/22/2020   GLUCOSE 87 10/22/2020   BUN 12 10/22/2020   CREATININE 0.85 10/22/2020   BILITOT 0.3 10/22/2020   ALKPHOS 84 10/10/2019   AST 23 10/22/2020   ALT 20 10/22/2020   PROT 7.1 10/22/2020   ALBUMIN 4.6 10/10/2019   CALCIUM 9.7 10/22/2020   GFRAA 84 10/22/2020   QFTBGOLDPLUS NEGATIVE 01/10/2020  March 30, 2021 labs from Dr. Cloria Spring office showed: CBC WBC 5.5, hemoglobin 13.3, platelets 288 differential normal, CMP normal AST 21 ALT 15, creatinine 0.80, LDL 148, TSH normal, vitamin D 41  Speciality Comments: TB gold neg 01/25/18 HIV, Hep, IG, SPEP neg 01/25/18  Procedures:  No procedures performed Allergies: Patient has no known allergies.   Assessment / Plan:     Visit Diagnoses: Inflammatory arthritis-she had longstanding history of seronegative inflammatory arthritis.  She had no synovitis on examination today.  She has been doing well on The combination of Humira and sulfasalazine.  High risk medication use - Humira 40 mg every 14 days and sulfasalazine 500 mg 2 tablets twice daily. -She has been advised to get labs every 3 months.  She had labs recently in June by her PCP which were reviewed today.  Plan: QuantiFERON-TB Gold Plus today.  She will get repeat labs in 3 months and then every 3 months to monitor for drug toxicity.  She been advised to stop sulfasalazine and Humira in case she develops an infection and resume after infection resolves.  She was also advised to get annual skin examination to screen for nonmelanoma skin cancer.  Recommendations regarding mineralizations were also placed in the AVS.  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  Raynaud's disease without gangrene-currently asymptomatic.  Chronic left SI joint pain - Resolved.  Spondylosis of lumbar region without myelopathy or radiculopathy - MRI of lumbar spine on 03/07/20 revealed L4-L5 broad based disc bulge and L5-S1 mild broad-based bulge with small left foraminal disc protrusion.  She continues to have lower back pain.  She states the back pain radiates to her left lower extremity.  She we will schedule an appointment with Dr. Ernestina Patches.  Fibromyalgia-she continues to have some generalized pain and discomfort.  Osteopenia of multiple sites - DEXA updated on 02/25/21. DEXA 05/16/2018 [facility] spine BMD 0.799 with T score -2.3.  Patient will have repeat DEXA with her GYN.  History of hypertension-blood pressure is well controlled.  Dyslipidemia-her recent labs showed elevated cholesterol.  Dietary modifications and exercise were discussed.  She may have to go on a statin medication.  History of macular degeneration  Orders: Orders Placed This Encounter  Procedures   QuantiFERON-TB Gold Plus    No orders of the defined types were placed in this encounter.    Follow-Up Instructions: Return in about 5 months (around 09/22/2021) for Inflammatory arthritis.   Bo Merino, MD  Note - This record has been created using Editor, commissioning.  Chart creation errors have been sought, but may not always  have been located. Such creation errors do not reflect on  the standard of medical care.

## 2021-04-22 ENCOUNTER — Ambulatory Visit: Payer: 59 | Admitting: Rheumatology

## 2021-04-22 ENCOUNTER — Encounter: Payer: Self-pay | Admitting: Rheumatology

## 2021-04-22 ENCOUNTER — Other Ambulatory Visit: Payer: Self-pay

## 2021-04-22 VITALS — BP 128/85 | HR 84 | Ht 64.0 in | Wt 129.6 lb

## 2021-04-22 DIAGNOSIS — M533 Sacrococcygeal disorders, not elsewhere classified: Secondary | ICD-10-CM

## 2021-04-22 DIAGNOSIS — Z8679 Personal history of other diseases of the circulatory system: Secondary | ICD-10-CM

## 2021-04-22 DIAGNOSIS — Z8669 Personal history of other diseases of the nervous system and sense organs: Secondary | ICD-10-CM | POA: Diagnosis not present

## 2021-04-22 DIAGNOSIS — M797 Fibromyalgia: Secondary | ICD-10-CM

## 2021-04-22 DIAGNOSIS — Z79899 Other long term (current) drug therapy: Secondary | ICD-10-CM | POA: Diagnosis not present

## 2021-04-22 DIAGNOSIS — E785 Hyperlipidemia, unspecified: Secondary | ICD-10-CM

## 2021-04-22 DIAGNOSIS — I7300 Raynaud's syndrome without gangrene: Secondary | ICD-10-CM

## 2021-04-22 DIAGNOSIS — G8929 Other chronic pain: Secondary | ICD-10-CM

## 2021-04-22 DIAGNOSIS — M1990 Unspecified osteoarthritis, unspecified site: Secondary | ICD-10-CM

## 2021-04-22 DIAGNOSIS — M8589 Other specified disorders of bone density and structure, multiple sites: Secondary | ICD-10-CM

## 2021-04-22 DIAGNOSIS — M47816 Spondylosis without myelopathy or radiculopathy, lumbar region: Secondary | ICD-10-CM

## 2021-04-22 NOTE — Patient Instructions (Signed)
Standing Labs We placed an order today for your standing lab work.   Please have your standing labs drawn in September and every 3 months  If possible, please have your labs drawn 2 weeks prior to your appointment so that the provider can discuss your results at your appointment.  Please note that you may see your imaging and lab results in [facility] before we have reviewed them. We may be awaiting multiple results to interpret others before contacting you. Please allow our office up to 72 hours to thoroughly review all of the results before contacting the office for clarification of your results.  We have open lab daily: Monday through Thursday from 1:30-4:30 PM and Friday from 1:30-4:00 PM at the office of Dr. Bo Merino, [facility].   Please be advised, all patients with office appointments requiring lab work will take precedent over walk-in lab work.  If possible, please come for your lab work on Monday and Friday afternoons, as you may experience shorter wait times. The office is located at [address], [place], [place], [place] [address] No appointment is necessary.   Labs are drawn by [facility]. Please bring your co-pay at the time of your lab draw.  You may receive a bill from [facility] for your lab work.  If you wish to have your labs drawn at another location, please call the office 24 hours in advance to send orders.  If you have any questions regarding directions or hours of operation,  please call [phone].   As a reminder, please drink plenty of water prior to coming for your lab work. Thanks!  If you test POSITIVE for COVID19 and have MILD to MODERATE symptoms: First, call your PCP if you would like to receive COVID19 treatment AND Hold your medications during the infection and for at least 1 week after your symptoms have resolved: Injectable medication (Benlysta, Cimzia, Cosentyx, Enbrel, Humira, Orencia, Remicade, Simponi, Stelara, Taltz,  Tremfya) Methotrexate Leflunomide (Arava) Mycophenolate (Cellcept) Morrie Sheldon, Olumiant, or Rinvoq If you take Actemra or Kevzara, you DO NOT need to hold these for COVID19 infection.  If you test POSITIVE for COVID19 and have NO symptoms: First, call your PCP if you would like to receive COVID19 treatment AND Hold your medications for at least 10 days after the day that you tested positive Injectable medication (Benlysta, Cimzia, Cosentyx, Enbrel, Humira, Orencia, Remicade, Simponi, Stelara, Taltz, Tremfya) Methotrexate Leflunomide (Arava) Mycophenolate (Cellcept) Morrie Sheldon, Olumiant, or Rinvoq If you take Actemra or Kevzara, you DO NOT need to hold these for COVID19 infection.  If you have signs or symptoms of an infection or start antibiotics: First, call your PCP for workup of your infection. Hold your medication through the infection, until you complete your antibiotics, and until symptoms resolve if you take the following: Injectable medication (Actemra, Benlysta, Cimzia, Cosentyx, Enbrel, Humira, Kevzara, Orencia, Remicade, Simponi, Stelara, Taltz, Tremfya) Methotrexate Leflunomide (Arava) Mycophenolate (Cellcept) Morrie Sheldon, Olumiant, or Rinvoq  Vaccines You are taking a medication(s) that can suppress your immune system.  The following immunizations are recommended: Flu annually Covid-19  Td/Tdap (tetanus, diphtheria, pertussis) every 10 years Pneumonia (Prevnar 15 then Pneumovax 23 at least 1 year apart.  Alternatively, can take Prevnar 20 without needing additional dose) Shingrix (after age 39): 2 doses from 4 weeks to 6 months apart  Please check with your PCP to make sure you are up to date.   Please  schedule annual skin examination with your dermatologist to scan for nonmelanoma skin cancer while you  are on Humira.  Heart Disease Prevention   Your inflammatory disease increases your risk of heart disease which includes heart attack, stroke, atrial fibrillation (irregular  heartbeats), high blood pressure, heart failure and atherosclerosis (plaque in the arteries).  It is important to reduce your risk by:   Keep blood pressure, cholesterol, and blood sugar at healthy levels   Smoking Cessation   Maintain a healthy weight  BMI 20-25   Eat a healthy diet  Plenty of fresh fruit, vegetables, and whole grains  Limit saturated fats, foods high in sodium, and added sugars  DASH and Mediterranean diet   Increase physical activity  Recommend moderate physically activity for 150 minutes per week/ 30 minutes a day for five days a week These can be broken up into three separate ten-minute sessions during the day.   Reduce Stress  Meditation, slow breathing exercises, yoga, coloring books  Dental visits twice a year

## 2021-04-23 ENCOUNTER — Other Ambulatory Visit: Payer: Self-pay | Admitting: Physician Assistant

## 2021-04-23 NOTE — Telephone Encounter (Signed)
Next Visit: 09/23/2021  Last Visit: 04/22/2021  Last Fill: 10/22/2020  DX:  Inflammatory arthritis  Current Dose per office note 04/22/2021: sulfasalazine 500 mg 2 tablets twice daily  Labs: March 30, 2021 labs from Dr. Cloria Spring office showed: CBC WBC 5.5, hemoglobin 13.3, platelets 288 differential normal, CMP normal AST 21 ALT 15, creatinine 0.80, LDL 148, TSH normal, vitamin D 41  Okay to refill SSZ?

## 2021-04-23 NOTE — Telephone Encounter (Signed)
Next Visit: 09/23/2021  Last Visit: 04/22/2021  Last Fill: 11/17/2020  Per protocol, okay to refill per Dr. Estanislado Pandy

## 2021-04-24 LAB — QUANTIFERON-TB GOLD PLUS
Mitogen-NIL: >10 IU/mL
NIL: 0.03 IU/mL
QuantiFERON-TB Gold Plus: NEGATIVE
TB1-NIL: 0 IU/mL
TB2-NIL: 0 IU/mL

## 2021-04-26 NOTE — Progress Notes (Signed)
TB gold is negative.

## 2021-04-27 ENCOUNTER — Telehealth: Payer: Self-pay | Admitting: Physical Medicine and Rehabilitation

## 2021-04-27 NOTE — Telephone Encounter (Signed)
Left L5 TF 09/10/2020. Ok to repeat if helped, same problem/side, and no new injury?

## 2021-04-27 NOTE — Telephone Encounter (Signed)
Pt called requesting to set an back injection appt before 8/6. Please call pt at [phone].

## 2021-04-28 NOTE — Telephone Encounter (Signed)
Is auth needed for repeat left L5 TF? Scheduled for 8/1.

## 2021-05-11 ENCOUNTER — Encounter: Payer: Self-pay | Admitting: Rheumatology

## 2021-05-11 ENCOUNTER — Ambulatory Visit (INDEPENDENT_AMBULATORY_CARE_PROVIDER_SITE_OTHER): Payer: 59 | Admitting: Physical Medicine and Rehabilitation

## 2021-05-11 ENCOUNTER — Other Ambulatory Visit: Payer: Self-pay

## 2021-05-11 ENCOUNTER — Encounter: Payer: Self-pay | Admitting: Physical Medicine and Rehabilitation

## 2021-05-11 ENCOUNTER — Telehealth: Payer: Self-pay | Admitting: Physical Medicine and Rehabilitation

## 2021-05-11 ENCOUNTER — Ambulatory Visit: Payer: Self-pay

## 2021-05-11 VITALS — BP 137/86 | HR 86

## 2021-05-11 DIAGNOSIS — M5416 Radiculopathy, lumbar region: Secondary | ICD-10-CM | POA: Diagnosis not present

## 2021-05-11 MED ORDER — METHYLPREDNISOLONE ACETATE 80 MG/ML IJ SUSP
80.0000 mg | Freq: Once | INTRAMUSCULAR | Status: AC
  Administered 2021-05-11: 80 mg

## 2021-05-11 NOTE — Telephone Encounter (Signed)
Called patient to advise that she can continue all her medications before the injection.

## 2021-05-11 NOTE — Telephone Encounter (Signed)
Pt wanted to know if she can take her blood pressure medicine before getting her epidural inj today @ 1? Pt would like a CB   [phone]

## 2021-05-11 NOTE — Progress Notes (Signed)
Monica [name] - 65 y.o. female MRN [id number]  Date of birth: 28-Feb-1956  Office Visit Note: Visit Date: 05/11/2021 PCP: Lemmie Evens, MD Referred by: Lemmie Evens, MD  Subjective: Chief Complaint  Patient presents with   Lower Back - Pain   Left Leg - Pain   HPI:  Monica [name] is a 65 y.o. female who comes in today at the request of Dr. Basil Dess for planned Left L5-S1 Lumbar Transforaminal epidural steroid injection with fluoroscopic guidance.  The patient has failed conservative care including home exercise, medications, time and activity modification.  This injection will be diagnostic and hopefully therapeutic.  Please see requesting physician notes for further details and justification.   ROS Otherwise per HPI.  Assessment & Plan: Visit Diagnoses:    ICD-10-CM   1. Lumbar radiculopathy  M54.16 XR C-ARM NO REPORT    Epidural Steroid injection    methylPREDNISolone acetate (DEPO-MEDROL) injection 80 mg      Plan: No additional findings.   Meds & Orders:  Meds ordered this encounter  Medications   methylPREDNISolone acetate (DEPO-MEDROL) injection 80 mg    Orders Placed This Encounter  Procedures   XR C-ARM NO REPORT   Epidural Steroid injection    Follow-up: Return if symptoms worsen or fail to improve.   Procedures: No procedures performed  Lumbosacral Transforaminal Epidural Steroid Injection - Sub-Pedicular Approach with Fluoroscopic Guidance  Patient: Monica [name]      Date of Birth: 06/13/1956 MRN: [id number] PCP: Lemmie Evens, MD      Visit Date: 05/11/2021   Universal Protocol:    Date/Time: 05/11/2021  Consent Given By: the patient  Position: PRONE  Additional Comments: Vital signs were monitored before and after the procedure. Patient was prepped and draped in the usual sterile fashion. The correct patient, procedure, and site was verified.   Injection Procedure Details:   Procedure diagnoses: Lumbar radiculopathy  [M54.16]    Meds Administered:  Meds ordered this encounter  Medications   methylPREDNISolone acetate (DEPO-MEDROL) injection 80 mg    Laterality: Left  Location/Site:  L5-S1  Needle:5.0 in., 22 ga.  Short bevel or Quincke spinal needle  Needle Placement: Transforaminal  Findings:    -Comments: Excellent flow of contrast along the nerve, nerve root and into the epidural space.  Procedure Details: After squaring off the end-plates to get a true AP view, the C-arm was positioned so that an oblique view of the foramen as noted above was visualized. The target area is just inferior to the "nose of the scotty dog" or sub pedicular. The soft tissues overlying this structure were infiltrated with 2-3 ml. of 1% Lidocaine without Epinephrine.  The spinal needle was inserted toward the target using a "trajectory" view along the fluoroscope beam.  Under AP and lateral visualization, the needle was advanced so it did not puncture dura and was located close the 6 O'Clock position of the pedical in AP tracterory. Biplanar projections were used to confirm position. Aspiration was confirmed to be negative for CSF and/or blood. A 1-2 ml. volume of Isovue-250 was injected and flow of contrast was noted at each level. Radiographs were obtained for documentation purposes.   After attaining the desired flow of contrast documented above, a 0.5 to 1.0 ml test dose of 0.25% Marcaine was injected into each respective transforaminal space.  The patient was observed for 90 seconds post injection.  After no sensory deficits were reported, and normal lower extremity motor function was noted,  the above injectate was administered so that equal amounts of the injectate were placed at each foramen (level) into the transforaminal epidural space.   Additional Comments:  The patient tolerated the procedure well Dressing: 2 x 2 sterile gauze and Band-Aid    Post-procedure details: Patient was observed during the  procedure. Post-procedure instructions were reviewed.  Patient left the clinic in stable condition.   Clinical History: Lspine MRI 10/26/2016 L4-L5: Mild broad-based disc bulge with a a right foraminal shallow disc protrusion. No evidence of neural foraminal stenosis. No central canal stenosis.   L5-S1: Broad-based disc bulge with a small left foraminal/lateral disc protrusion abutting the left extraforaminal L5 nerve root. Mild bilateral facet arthropathy. No evidence of neural foraminal stenosis. No central canal stenosis.   IMPRESSION: 1. At L5-S1 there is a broad-based disc bulge with a small left foraminal/lateral disc protrusion abutting the left extraforaminal L5 nerve root. 2. At L4-5 there is a mild broad-based disc bulge with a a right foraminal shallow disc protrusion.     Objective:  VS:  HT:    WT:   BMI:     BP:137/86  HR:86bpm  TEMP: ( )  RESP:  Physical Exam Vitals and nursing note reviewed.  Constitutional:      General: She is not in acute distress.    Appearance: Normal appearance. She is not ill-appearing.  HENT:     Head: Normocephalic and atraumatic.     Right Ear: External ear normal.     Left Ear: External ear normal.  Eyes:     Extraocular Movements: Extraocular movements intact.  Cardiovascular:     Rate and Rhythm: Normal rate.     Pulses: Normal pulses.  Pulmonary:     Effort: Pulmonary effort is normal. No respiratory distress.  Abdominal:     General: There is no distension.     Palpations: Abdomen is soft.  Musculoskeletal:        General: Tenderness present.     Cervical back: Neck supple.     Right lower leg: No edema.     Left lower leg: No edema.     Comments: Patient has good distal strength with no pain over the greater trochanters.  No clonus or focal weakness.  Skin:    Findings: No erythema, lesion or rash.  Neurological:     General: No focal deficit present.     Mental Status: She is alert and oriented to person,  place, and time.     Sensory: No sensory deficit.     Motor: No weakness or abnormal muscle tone.     Coordination: Coordination normal.  Psychiatric:        Mood and Affect: Mood normal.        Behavior: Behavior normal.     Imaging: No results found.

## 2021-05-11 NOTE — Progress Notes (Signed)
Pt state lower back pain that travels down left leg. Pt state bending and laying down at night the pain wakes her up. Pt state she take over the counter pain and pain cream to help ease her pain. Pt has hx of inj on 09/10/20 pt state it helped.   Numeric Pain Rating Scale and Functional Assessment Average Pain 5   In the last MONTH (on 0-10 scale) has pain interfered with the following?  1. General activity like being  able to carry out your everyday physical activities such as walking, climbing stairs, carrying groceries, or moving a chair?  Rating(10)   +Driver, -BT, -Dye Allergies.

## 2021-05-11 NOTE — Patient Instructions (Signed)

## 2021-05-12 NOTE — Telephone Encounter (Signed)
Ok to proceed with shingrix vaccine.  Shingrix is not a live vaccine so it is safe to take while on humira.

## 2021-05-12 NOTE — Telephone Encounter (Signed)
I called patient, patient verbalized understanding. 

## 2021-05-18 NOTE — Procedures (Signed)
Lumbosacral Transforaminal Epidural Steroid Injection - Sub-Pedicular Approach with Fluoroscopic Guidance  Patient: Monica [name]      Date of Birth: [birth year] MRN: [id number] PCP: Lemmie Evens, MD      Visit Date: 05/11/2021   Universal Protocol:    Date/Time: 05/11/2021  Consent Given By: the patient  Position: PRONE  Additional Comments: Vital signs were monitored before and after the procedure. Patient was prepped and draped in the usual sterile fashion. The correct patient, procedure, and site was verified.   Injection Procedure Details:   Procedure diagnoses: Lumbar radiculopathy [M54.16]    Meds Administered:  Meds ordered this encounter  Medications   methylPREDNISolone acetate (DEPO-MEDROL) injection 80 mg    Laterality: Left  Location/Site:  L5-S1  Needle:5.0 in., 22 ga.  Short bevel or Quincke spinal needle  Needle Placement: Transforaminal  Findings:    -Comments: Excellent flow of contrast along the nerve, nerve root and into the epidural space.  Procedure Details: After squaring off the end-plates to get a true AP view, the C-arm was positioned so that an oblique view of the foramen as noted above was visualized. The target area is just inferior to the "nose of the scotty dog" or sub pedicular. The soft tissues overlying this structure were infiltrated with 2-3 ml. of 1% Lidocaine without Epinephrine.  The spinal needle was inserted toward the target using a "trajectory" view along the fluoroscope beam.  Under AP and lateral visualization, the needle was advanced so it did not puncture dura and was located close the 6 O'Clock position of the pedical in AP tracterory. Biplanar projections were used to confirm position. Aspiration was confirmed to be negative for CSF and/or blood. A 1-2 ml. volume of Isovue-250 was injected and flow of contrast was noted at each level. Radiographs were obtained for documentation purposes.   After attaining the desired  flow of contrast documented above, a 0.5 to 1.0 ml test dose of 0.25% Marcaine was injected into each respective transforaminal space.  The patient was observed for 90 seconds post injection.  After no sensory deficits were reported, and normal lower extremity motor function was noted,   the above injectate was administered so that equal amounts of the injectate were placed at each foramen (level) into the transforaminal epidural space.   Additional Comments:  The patient tolerated the procedure well Dressing: 2 x 2 sterile gauze and Band-Aid    Post-procedure details: Patient was observed during the procedure. Post-procedure instructions were reviewed.  Patient left the clinic in stable condition.

## 2021-05-25 ENCOUNTER — Other Ambulatory Visit: Payer: Self-pay | Admitting: Physician Assistant

## 2021-05-25 ENCOUNTER — Other Ambulatory Visit: Payer: Self-pay | Admitting: Rheumatology

## 2021-05-25 NOTE — Telephone Encounter (Signed)
Next Visit: 09/23/2021   Last Visit: 04/22/2021   Last Fill: 04/23/2021  Okay to refill FABB tab?

## 2021-05-28 ENCOUNTER — Other Ambulatory Visit: Payer: Self-pay | Admitting: Physician Assistant

## 2021-05-28 NOTE — Telephone Encounter (Signed)
Next Visit: 09/23/2021  Last Visit: 04/22/2021  Last Fill: 12/09/2020  [id number] arthritis  Current Dose per office note 04/22/2021: Humira 40 mg every 14 days   Labs: March 30, 2021 labs from Dr. Cloria Spring office showed: CBC WBC 5.5, hemoglobin 13.3, platelets 288 differential normal, CMP normal AST 21 ALT 15, creatinine 0.80, LDL 148, TSH normal, vitamin D 41  TB Gold: 04/22/2021 Neg    Okay to refill Humira?

## 2021-07-07 ENCOUNTER — Telehealth: Payer: Self-pay | Admitting: Rheumatology

## 2021-07-07 DIAGNOSIS — Z79899 Other long term (current) drug therapy: Secondary | ICD-10-CM

## 2021-07-07 NOTE — Telephone Encounter (Signed)
Patient going to [facility] in [place] this week for lab draw. Please release orders.

## 2021-07-07 NOTE — Telephone Encounter (Signed)
Lab Orders released.  

## 2021-07-09 LAB — COMPLETE METABOLIC PANEL WITH GFR
AG Ratio: 1.6 (calc) (ref 1.0–2.5)
ALT: 18 U/L (ref 6–29)
AST: 22 U/L (ref 10–35)
Albumin: 4.2 g/dL (ref 3.6–5.1)
Alkaline phosphatase (APISO): 70 U/L (ref 37–153)
BUN: 15 mg/dL (ref 7–25)
CO2: 28 mmol/L (ref 20–32)
Calcium: 9.7 mg/dL (ref 8.6–10.4)
Chloride: 104 mmol/L (ref 98–110)
Creat: 0.8 mg/dL (ref 0.50–1.05)
Globulin: 2.6 g/dL (calc) (ref 1.9–3.7)
Glucose, Bld: 91 mg/dL (ref 65–99)
Potassium: 4.1 mmol/L (ref 3.5–5.3)
Sodium: 139 mmol/L (ref 135–146)
Total Bilirubin: 0.3 mg/dL (ref 0.2–1.2)
Total Protein: 6.8 g/dL (ref 6.1–8.1)
eGFR: 82 mL/min/{1.73_m2} (ref >=60)

## 2021-07-09 LAB — CBC WITH DIFFERENTIAL/PLATELET
Absolute Monocytes: 498 cells/uL (ref 200–950)
Basophils Absolute: 19 cells/uL (ref 0–200)
Basophils Relative: 0.4 %
Eosinophils Absolute: 89 cells/uL (ref 15–500)
Eosinophils Relative: 1.9 %
HCT: 38.4 % (ref 35.0–45.0)
Hemoglobin: 12.6 g/dL (ref 11.7–15.5)
Lymphs Abs: 2073 cells/uL (ref 850–3900)
MCH: 31.2 pg (ref 27.0–33.0)
MCHC: 32.8 g/dL (ref 32.0–36.0)
MCV: 95 fL (ref 80.0–100.0)
MPV: 8.7 fL (ref 7.5–12.5)
Monocytes Relative: 10.6 %
Neutro Abs: 2021 cells/uL (ref 1500–7800)
Neutrophils Relative %: 43 %
Platelets: 285 10*3/uL (ref 140–400)
RBC: 4.04 10*6/uL (ref 3.80–5.10)
RDW: 12.2 % (ref 11.0–15.0)
Total Lymphocyte: 44.1 %
WBC: 4.7 10*3/uL (ref 3.8–10.8)

## 2021-07-28 ENCOUNTER — Other Ambulatory Visit: Payer: Self-pay | Admitting: Physician Assistant

## 2021-07-28 NOTE — Telephone Encounter (Signed)
Next Visit: 09/23/2021  Last Visit: 04/22/2021  Last Fill: 04/23/2021  DX: Inflammatory arthritis  Current Dose per office note on 04/22/2021: sulfasalazine 500 mg 2 tablets twice daily.  Labs: 07/08/2021 CBC and CMP WNL  Okay to refill sulfasalazine?

## 2021-09-07 ENCOUNTER — Other Ambulatory Visit: Payer: Self-pay | Admitting: Rheumatology

## 2021-09-07 NOTE — Telephone Encounter (Signed)
Next Visit: 09/23/2021   Last Visit: 04/22/2021   Last Fill: 05/25/2021  Okay to refill FABB Tabs?

## 2021-09-09 NOTE — Progress Notes (Signed)
Office Visit Note  Patient: Monica [name]             Date of Birth: 1955-11-22           MRN: [id number]             PCP: Lemmie Evens, MD Referring: Lemmie Evens, MD Visit Date: 09/23/2021 Occupation: @GUAROCC @  Subjective:  Medication monitoring   History of Present Illness: Monica [name] is a 65 y.o. female with history of inflammatory arthritis, iritis, and fibromyalgia.  She is on humira 40 mg sq injections every 14 days and sulfasalazine 500 mg 2 tablets by mouth twice daily.  She is tolerating both medications without any side effects and has not missed any doses recently.  She denies any signs or symptoms of an plantar arthritis flare recently.  She has occasional discomfort in her lower back especially when lying down at night.  She has been using Voltaren gel topically as needed and takes Tylenol 2 tablets at bedtime for symptomatic relief.  She denies any increased joint pain or joint swelling in her other joints.  She has been walking on a daily basis for exercise.  She will be switching to Medicare in January at which time she will be eligible for Silver sneakers which she plans on trying in the future. She denies any recent infections.   Activities of Daily Living:  Patient reports morning stiffness for 10-15 minutes.   Patient Reports nocturnal pain.  Difficulty dressing/grooming: Denies Difficulty climbing stairs: Denies Difficulty getting out of chair: Denies Difficulty using hands for taps, buttons, cutlery, and/or writing: Denies  Review of Systems  Constitutional:  Negative for fatigue.  HENT:  Negative for mouth sores, mouth dryness and nose dryness.   Eyes:  Positive for dryness. Negative for pain, itching and visual disturbance.  Respiratory:  Negative for cough, hemoptysis, shortness of breath and difficulty breathing.   Cardiovascular:  Positive for palpitations. Negative for chest pain, hypertension and swelling in legs/feet.  Gastrointestinal:   Negative for blood in stool, constipation and diarrhea.  Endocrine: Negative for increased urination.  Genitourinary:  Negative for difficulty urinating and painful urination.  Musculoskeletal:  Positive for joint pain, joint pain and morning stiffness. Negative for joint swelling, myalgias, muscle weakness, muscle tenderness and myalgias.  Skin:  Positive for color change. Negative for pallor, rash, hair loss, nodules/bumps, redness, skin tightness, ulcers and sensitivity to sunlight.  Allergic/Immunologic: Negative for susceptible to infections.  Neurological:  Positive for numbness. Negative for dizziness, headaches, memory loss and weakness.  Hematological:  Negative for bruising/bleeding tendency and swollen glands.  Psychiatric/Behavioral:  Negative for depressed mood, confusion and sleep disturbance. The patient is not nervous/anxious.    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[place])    Raynaud disease    Fibromyalgia    Inflammatory arthritis    Osteopenia     Past Medical History:  Diagnosis Date   Arthritis    Fibromyalgia    Macular degeneration    Left eye   Osteopenia 05/2018   T score -2.3 FRAX 11% / 1.8% overall stable  from prior DEXA   RA (rheumatoid arthritis) ([place])    Raynaud disease    Reiters syndrome    Sleep disturbance    Vaginal dryness     Family History  Problem Relation Age of Onset   Hypertension Sister    Heart disease Brother    Hyperlipidemia Brother    Heart attack Brother    Alzheimer's disease Mother    Osteoarthritis Mother    High Cholesterol  Sister    Heart attack Brother    Heart disease Brother    Healthy Son    Healthy Son    Past Surgical History:  Procedure Laterality Date   BREAST SURGERY     LEFT BR LUMP-Benign   COLONOSCOPY N/A 08/04/2018   Procedure: COLONOSCOPY;  Surgeon: Danie Binder, MD;  Location: [facility];  Service: Endoscopy;  Laterality: N/A;  8:30   POLYPECTOMY  08/04/2018   Procedure: POLYPECTOMY;  Surgeon: Danie Binder, MD;  Location: [facility];  Service: Endoscopy;;   Social History   Social History Narrative   Not on file   Immunization History  Administered Date(s) Administered   Influenza,inj,Quad PF,6+ Mos 08/24/2012, 09/25/2013, 11/13/2014, 11/17/2017   Moderna Sars-Covid-2 Vaccination 12/05/2019, 01/02/2020, 08/30/2020     Objective: Vital Signs: BP (!) 136/91 (BP Location: Left Arm, Patient Position: Sitting, Cuff Size: Normal)   Pulse 80   Ht 5\' 4"  (1.626 m)   Wt 124 lb 3.2 oz (56.3 kg)   BMI 21.32 kg/m    Physical Exam Vitals and nursing note reviewed.  Constitutional:      Appearance: She is well-developed.  HENT:     Head: Normocephalic and atraumatic.  Eyes:     Conjunctiva/sclera: Conjunctivae normal.  Pulmonary:     Effort: Pulmonary effort is normal.  Abdominal:     Palpations: Abdomen is soft.  Musculoskeletal:     Cervical back: Normal range of motion.  Skin:    General: Skin is warm and dry.     Capillary Refill: Capillary refill takes less than 2 seconds.  Neurological:     Mental Status: She is alert and oriented to person, place, and time.  Psychiatric:        Behavior: Behavior normal.     Musculoskeletal Exam:, Thoracic spine, lumbar spine good range of motion with no discomfort.  Tenderness over both SI joints.  No midline spinal tenderness.  Shoulder joints, elbow joints, wrist joints, MCPs, PIPs, DIPs have good range of motion with no synovitis.  She has PIP and DIP thickening consistent with osteoarthritis of both hands.  Complete fist  formation bilaterally.  Hip joints have good range of motion with no groin pain.  Mild tenderness over trochanteric bursa bilaterally.  Knee joints have good range of motion with no warmth or effusion.  Ankle joints have good range of motion with no tenderness or joint swelling.  CDAI Exam: CDAI Score: -- Patient Global: --; Provider Global: -- Swollen: --; Tender: -- Joint Exam 09/23/2021   No joint exam has been documented for this visit   There is currently no information documented on the homunculus. Go to the Rheumatology activity and complete the homunculus joint exam.  Investigation: No additional findings.  Imaging: No results found.  Recent Labs: Lab Results  Component Value Date   WBC 4.7 07/08/2021   HGB 12.6 07/08/2021   PLT 285 07/08/2021   NA 139 07/08/2021   K 4.1 07/08/2021   CL 104 07/08/2021   CO2  28 07/08/2021   GLUCOSE 91 07/08/2021   BUN 15 07/08/2021   CREATININE 0.80 07/08/2021   BILITOT 0.3 07/08/2021   ALKPHOS 84 10/10/2019   AST 22 07/08/2021   ALT 18 07/08/2021   PROT 6.8 07/08/2021   ALBUMIN 4.6 10/10/2019   CALCIUM 9.7 07/08/2021   GFRAA 84 10/22/2020   QFTBGOLDPLUS NEGATIVE 04/22/2021    Speciality Comments: TB gold neg 01/25/18 HIV, Hep, IG, SPEP neg 01/25/18  Procedures:  No procedures performed Allergies: Patient has no known allergies.   Assessment / Plan:     Visit Diagnoses: Inflammatory arthritis: She has no synovitis on examination today.  She has clinically been doing well on Humira 40 mg subcutaneous injections every 14 days and sulfasalazine 500 mg 2 tablets by mouth twice daily.  She is tolerating both medications without any side effects and has not missed any doses recently.  She has some SI joint tenderness bilaterally.  No midline spinal tenderness was noted.  Her morning stiffness continues to last 10 to 15 minutes daily.  She experiences some discomfort in her lower back when lying down at night so she has been taking  Tylenol at bedtime as well as using Voltaren gel topically for pain relief.  She remains active walking at least 2 miles daily for exercise.  In January she will be switching to Medicare at which time she will be eligible for Silver sneakers which she plans on trying in the future.  Discussed the importance of joint protection and muscle strengthening.  She will remain on the current treatment regimen.  She does not need any refills at this time.  She was advised to notify us if she develops increased joint pain or joint swelling.  She will follow-up in the office in 5 months.  High risk medication use - Humira 40 mg every 14 days and sulfasalazine 500 mg 2 tablets twice daily.CBC and CMP updated on 07/08/21.  She is due to update lab work today. Orders released.  Her next lab work will be due in March and every 3 months.  Standing orders are in place. TB gold negative on 04/22/21.   She has not had any recent infections.  Discussed the importance of holding Humira and sulfasalazine if she develop signs or symptoms of an infection and to resume once the infection has completely cleared. - Plan: CBC with Differential/Platelet, COMPLETE METABOLIC PANEL WITH GFR  History of iritis: She has not had any signs or symptoms of iritis flare.  No eye pain, photophobia, or conjunctival injection.  Raynaud's disease without gangrene: Not currently active.  No digital ulcerations or signs of sclerodactyly noted.  Chronic left SI joint pain - She has intermittent tenderness and stiffness in her lower back.  She has some tenderness over both SI joints.  She has occasional discomfort at night when lying on her back.  She takes Tylenol 2 tablets at bedtime and uses Voltaren gel topically as needed.  Spondylosis of lumbar region without myelopathy or radiculopathy - MRI of lumbar spine on 03/07/20 revealed L4-L5 broad based disc bulge and L5-S1 mild broad-based bulge with small left foraminal disc protrusion.  She experiences  increased discomfort in her lower back when lying down at night.  She is Voltaren gel topically as needed for pain relief and takes Tylenol 2 tablets at bedtime.  She has not had any symptoms of radiculopathy recently.  Fibromyalgia: She has not had any recent fibromyalgia flares.  Osteopenia of multiple sites - DEXA updated  on 02/25/21: [facility] spine BMD 0.771 with T score -2.5. DEXA 05/16/2018 [facility] spine BMD 0.799 with T score -2.3.  She is currently taking calcium and vitamin D supplement daily.  No recent falls or fractures.  Other medical conditions are listed as follows:  History of hypertension: Blood pressure is 136/91 today in the office.  Dyslipidemia  History of macular degeneration  Orders: Orders Placed This Encounter  Procedures   CBC with Differential/Platelet   COMPLETE METABOLIC PANEL WITH GFR   No orders of the defined types were placed in this encounter.     Follow-Up Instructions: Return in about 5 months (around 02/21/2022) for Inflammatory arthritis, iritis, fibromyalgia .   Ofilia Neas, PA-C  Note - This record has been created using Dragon software.  Chart creation errors have been sought, but may not always  have been located. Such creation errors do not reflect on  the standard of medical care.

## 2021-09-23 ENCOUNTER — Encounter: Payer: Self-pay | Admitting: Physician Assistant

## 2021-09-23 ENCOUNTER — Other Ambulatory Visit: Payer: Self-pay

## 2021-09-23 ENCOUNTER — Ambulatory Visit: Payer: 59 | Admitting: Physician Assistant

## 2021-09-23 VITALS — BP 136/91 | HR 80 | Ht 64.0 in | Wt 124.2 lb

## 2021-09-23 DIAGNOSIS — E785 Hyperlipidemia, unspecified: Secondary | ICD-10-CM

## 2021-09-23 DIAGNOSIS — Z79899 Other long term (current) drug therapy: Secondary | ICD-10-CM

## 2021-09-23 DIAGNOSIS — M797 Fibromyalgia: Secondary | ICD-10-CM

## 2021-09-23 DIAGNOSIS — M533 Sacrococcygeal disorders, not elsewhere classified: Secondary | ICD-10-CM

## 2021-09-23 DIAGNOSIS — Z8679 Personal history of other diseases of the circulatory system: Secondary | ICD-10-CM

## 2021-09-23 DIAGNOSIS — I7300 Raynaud's syndrome without gangrene: Secondary | ICD-10-CM

## 2021-09-23 DIAGNOSIS — M1990 Unspecified osteoarthritis, unspecified site: Secondary | ICD-10-CM

## 2021-09-23 DIAGNOSIS — G8929 Other chronic pain: Secondary | ICD-10-CM

## 2021-09-23 DIAGNOSIS — Z8669 Personal history of other diseases of the nervous system and sense organs: Secondary | ICD-10-CM | POA: Diagnosis not present

## 2021-09-23 DIAGNOSIS — M47816 Spondylosis without myelopathy or radiculopathy, lumbar region: Secondary | ICD-10-CM

## 2021-09-23 DIAGNOSIS — M8589 Other specified disorders of bone density and structure, multiple sites: Secondary | ICD-10-CM

## 2021-09-23 LAB — COMPLETE METABOLIC PANEL WITH GFR
AG Ratio: 1.7 (calc) (ref 1.0–2.5)
ALT: 17 U/L (ref 6–29)
AST: 21 U/L (ref 10–35)
Albumin: 4.3 g/dL (ref 3.6–5.1)
Alkaline phosphatase (APISO): 64 U/L (ref 37–153)
BUN: 14 mg/dL (ref 7–25)
CO2: 32 mmol/L (ref 20–32)
Calcium: 9.8 mg/dL (ref 8.6–10.4)
Chloride: 103 mmol/L (ref 98–110)
Creat: 0.99 mg/dL (ref 0.50–1.05)
Globulin: 2.5 g/dL (calc) (ref 1.9–3.7)
Glucose, Bld: 94 mg/dL (ref 65–99)
Potassium: 4.3 mmol/L (ref 3.5–5.3)
Sodium: 140 mmol/L (ref 135–146)
Total Bilirubin: 0.3 mg/dL (ref 0.2–1.2)
Total Protein: 6.8 g/dL (ref 6.1–8.1)
eGFR: 64 mL/min/{1.73_m2} (ref >=60)

## 2021-09-23 LAB — CBC WITH DIFFERENTIAL/PLATELET
Absolute Monocytes: 622 cells/uL (ref 200–950)
Basophils Absolute: 28 cells/uL (ref 0–200)
Basophils Relative: 0.5 %
Eosinophils Absolute: 83 cells/uL (ref 15–500)
Eosinophils Relative: 1.5 %
HCT: 40.1 % (ref 35.0–45.0)
Hemoglobin: 13.1 g/dL (ref 11.7–15.5)
Lymphs Abs: 2107 cells/uL (ref 850–3900)
MCH: 31 pg (ref 27.0–33.0)
MCHC: 32.7 g/dL (ref 32.0–36.0)
MCV: 94.8 fL (ref 80.0–100.0)
MPV: 9.1 fL (ref 7.5–12.5)
Monocytes Relative: 11.3 %
Neutro Abs: 2662 cells/uL (ref 1500–7800)
Neutrophils Relative %: 48.4 %
Platelets: 278 10*3/uL (ref 140–400)
RBC: 4.23 10*6/uL (ref 3.80–5.10)
RDW: 11.5 % (ref 11.0–15.0)
Total Lymphocyte: 38.3 %
WBC: 5.5 10*3/uL (ref 3.8–10.8)

## 2021-09-23 NOTE — Patient Instructions (Signed)
Standing Labs °We placed an order today for your standing lab work.  ° °Please have your standing labs drawn in March and every 3 months  ° ° °If possible, please have your labs drawn 2 weeks prior to your appointment so that the provider can discuss your results at your appointment. ° °Please note that you may see your imaging and lab results in MyChart before we have reviewed them. °We may be awaiting multiple results to interpret others before contacting you. °Please allow our office up to 72 hours to thoroughly review all of the results before contacting the office for clarification of your results. ° °We have open lab daily: °Monday through Thursday from 1:30-4:30 PM and Friday from 1:30-4:00 PM °at the office of Dr. Shaili Deveshwar, [facility].   °Please be advised, all patients with office appointments requiring lab work will take precedent over walk-in lab work.  °If possible, please come for your lab work on Monday and Friday afternoons, as you may experience shorter wait times. °The office is located at [address], [address], [place], [place] [address] °No appointment is necessary.   °Labs are drawn by [facility]. Please bring your co-pay at the time of your lab draw.  You may receive a bill from [facility] for your lab work. ° °If you wish to have your labs drawn at another location, please call the office 24 hours in advance to send orders. ° °If you have any questions regarding directions or hours of operation,  °please call [phone].   °As a reminder, please drink plenty of water prior to coming for your lab work. Thanks! ° °

## 2021-09-24 ENCOUNTER — Telehealth: Payer: Self-pay | Admitting: Pharmacist

## 2021-09-24 NOTE — Progress Notes (Signed)
CBC and CMP WNL

## 2021-09-24 NOTE — Telephone Encounter (Addendum)
Patient called stating that she will be enrolling into Medicare starting 10/11/2021. She received her [place] card at home recently and will upload into [place]. She is aware that the PA cannot be submitted until 10/11/2021.  She will mail patient portion of application to [employer] directly. Advised that provider portion will be signed and sent to [employer] directly as well as soon as we receive notification that she has submitted her portion. Signed provider portion placed in "PAP pending info" folder in pharmacy office.  Reviewed appeal process if she is initially denied PAP.  She currently has 5 Humira pens at home.  Knox Saliva, PharmD, MPH, BCPS Clinical Pharmacist (Rheumatology and Pulmonology)

## 2021-09-30 ENCOUNTER — Other Ambulatory Visit: Payer: Self-pay | Admitting: Physician Assistant

## 2021-09-30 NOTE — Telephone Encounter (Signed)
Next Visit: 03/24/2022   Last Visit: 09/23/2021  Last Fill: 10/22/2020  Dx: Chronic left SI joint pain   Current Dose per office note on 09/23/2021: Voltaren gel topically as needed  Okay to refill Voltaren Gel?

## 2021-10-15 ENCOUNTER — Other Ambulatory Visit (HOSPITAL_COMMUNITY): Payer: Self-pay

## 2021-10-15 NOTE — Telephone Encounter (Signed)
Submitted a Prior Authorization request to [facility] for [place] via CoverMyMeds. Will update once we receive a response - new Medicare plan  Key: [id number]  Knox Saliva, PharmD, MPH, BCPS Clinical Pharmacist (Rheumatology and Pulmonology)

## 2021-10-15 NOTE — Telephone Encounter (Signed)
Received notification from [facility] regarding a prior authorization for HUMIRA. Authorization has been APPROVED from 10/11/21 to 10/10/22.   Per test claim, copay for first 28 days supply is $100. Copay may change d/t coverage gap.  Patient was provided [employer] PAP application in December 2022 and was planning to complete and submit directly to company  Authorization # [id number]  Knox Saliva, PharmD, MPH, BCPS Clinical Pharmacist (Rheumatology and Pulmonology)

## 2021-10-26 ENCOUNTER — Other Ambulatory Visit (HOSPITAL_COMMUNITY): Payer: Self-pay

## 2021-10-26 NOTE — Telephone Encounter (Signed)
Called patient for update on Humira [employer] application. Per patient, her husband does not feel comfortable submitting income documents. They are not sure if they qualify based on his additional income. I did advise that they discard income documents and information is not distributed.  Copay for 28 day supply of Humira remains $100. I did advise that PAP application can take 2 weeks to process due to new year. She states she has enough Humira at home right now to get through mid February.  She states she will call back with update on what they decide to do.  Knox Saliva, PharmD, MPH, BCPS Clinical Pharmacist (Rheumatology and Pulmonology)

## 2021-11-09 ENCOUNTER — Other Ambulatory Visit: Payer: Self-pay | Admitting: Rheumatology

## 2021-11-09 NOTE — Telephone Encounter (Signed)
Next Visit: 03/24/2022    Last Visit: 09/23/2021   Last Fill: 05/28/2021  Dx: Inflammatory arthritis   Current Dose per office note on 09/23/2021: Humira 40 mg every 14 days   Labs: 09/23/2021 CBC and CMP WNL.  TB Gold: 04/22/2021 Neg   Okay to refill Humira?

## 2021-12-17 ENCOUNTER — Other Ambulatory Visit: Payer: Self-pay | Admitting: Physician Assistant

## 2021-12-17 NOTE — Telephone Encounter (Signed)
Next Visit: 03/24/2022  ?  ?Last Visit: 09/23/2021 ?  ?Last Fill: 07/28/2021 ? ?DX: Inflammatory arthritis ?  ?Current Dose per office note on 09/23/2021: sulfasalazine 500 mg 2 tablets twice daily ? ?Labs: 09/23/2021 CBC and CMP WNL. ? ?Left message to advise patient she is due to update labs this month.  ? ?Okay to refill SSZ?  ?

## 2022-02-15 ENCOUNTER — Other Ambulatory Visit: Payer: Self-pay | Admitting: *Deleted

## 2022-02-15 ENCOUNTER — Telehealth: Payer: Self-pay | Admitting: Rheumatology

## 2022-02-15 DIAGNOSIS — Z79899 Other long term (current) drug therapy: Secondary | ICD-10-CM

## 2022-02-15 NOTE — Telephone Encounter (Signed)
Lab Orders released.  

## 2022-02-15 NOTE — Telephone Encounter (Signed)
Patient called the office requesting lab orders be sent to [facility] in [place]. Patient sates she is going tomorrow. ?

## 2022-02-17 LAB — CBC WITH DIFFERENTIAL/PLATELET
Absolute Monocytes: 584 cells/uL (ref 200–950)
Basophils Absolute: 18 cells/uL (ref 0–200)
Basophils Relative: 0.3 %
Eosinophils Absolute: 59 cells/uL (ref 15–500)
Eosinophils Relative: 1 %
HCT: 42.4 % (ref 35.0–45.0)
Hemoglobin: 13.6 g/dL (ref 11.7–15.5)
Lymphs Abs: 2071 cells/uL (ref 850–3900)
MCH: 30.3 pg (ref 27.0–33.0)
MCHC: 32.1 g/dL (ref 32.0–36.0)
MCV: 94.4 fL (ref 80.0–100.0)
MPV: 8.8 fL (ref 7.5–12.5)
Monocytes Relative: 9.9 %
Neutro Abs: 3168 cells/uL (ref 1500–7800)
Neutrophils Relative %: 53.7 %
Platelets: 334 10*3/uL (ref 140–400)
RBC: 4.49 10*6/uL (ref 3.80–5.10)
RDW: 12.4 % (ref 11.0–15.0)
Total Lymphocyte: 35.1 %
WBC: 5.9 10*3/uL (ref 3.8–10.8)

## 2022-02-17 LAB — COMPLETE METABOLIC PANEL WITH GFR
AG Ratio: 1.6 (calc) (ref 1.0–2.5)
ALT: 18 U/L (ref 6–29)
AST: 21 U/L (ref 10–35)
Albumin: 4.2 g/dL (ref 3.6–5.1)
Alkaline phosphatase (APISO): 86 U/L (ref 37–153)
BUN: 12 mg/dL (ref 7–25)
CO2: 32 mmol/L (ref 20–32)
Calcium: 9.6 mg/dL (ref 8.6–10.4)
Chloride: 103 mmol/L (ref 98–110)
Creat: 0.88 mg/dL (ref 0.50–1.05)
Globulin: 2.7 g/dL (calc) (ref 1.9–3.7)
Glucose, Bld: 105 mg/dL — ABNORMAL HIGH (ref 65–99)
Potassium: 4.2 mmol/L (ref 3.5–5.3)
Sodium: 141 mmol/L (ref 135–146)
Total Bilirubin: 0.3 mg/dL (ref 0.2–1.2)
Total Protein: 6.9 g/dL (ref 6.1–8.1)
eGFR: 73 mL/min/{1.73_m2} (ref >=60)

## 2022-02-17 NOTE — Progress Notes (Signed)
CBC and CMP are stable.

## 2022-02-18 ENCOUNTER — Other Ambulatory Visit: Payer: Self-pay | Admitting: Physician Assistant

## 2022-02-18 NOTE — Telephone Encounter (Signed)
Next Visit: 03/24/2022  ?  ?Last Visit: 09/23/2021 ?  ?Last Fill: 12/17/2021 ( 30 day supply) ?  ?DX: Inflammatory arthritis ?  ?Current Dose per office note on 09/23/2021: sulfasalazine 500 mg 2 tablets twice daily ?  ?Labs: 02/16/2022 CBC and CMP are stable. ? ?Okay to refill SSZ?  ?

## 2022-03-11 NOTE — Progress Notes (Signed)
Office Visit Note  Patient: Monica [name]             Date of Birth: 02/18/1956           MRN: [id number]             PCP: Lemmie Evens, MD Referring: Lemmie Evens, MD Visit Date: 03/24/2022 Occupation: '@GUAROCC'$ @  Subjective:  Medication management  History of Present Illness: Monica [name] is a 66 y.o. female with history of chronic inflammatory arthritis.  She states she has been doing well on the combination of Humira 40 mg subcu every 14 days and sulfasalazine 500 mg, 2 tablets p.o. twice daily.  She has not noticed any joint swelling or joint pain.  She states she continues to have some discomfort in her lower back.  She denies any history of iritis.  She denies episodes of raynaud's phenominon in the warmer weather.  She is not having any discomfort from fibromyalgia.  She continues to have fatigue.   Activities of Daily Living:  Patient reports morning stiffness for 30 minutes.   Patient Reports nocturnal pain.  Difficulty dressing/grooming: Denies Difficulty climbing stairs: Denies Difficulty getting out of chair: Denies Difficulty using hands for taps, buttons, cutlery, and/or writing: Denies  Review of Systems  Constitutional:  Positive for fatigue.  HENT:  Negative for mouth dryness.   Eyes:  Positive for dryness.  Respiratory:  Negative for shortness of breath.   Cardiovascular:  Negative for swelling in legs/feet.  Gastrointestinal:  Negative for constipation.  Endocrine: Positive for cold intolerance and heat intolerance.  Genitourinary:  Negative for difficulty urinating.  Musculoskeletal:  Positive for morning stiffness.  Skin:  Negative for rash.  Allergic/Immunologic: Negative for susceptible to infections.  Neurological:  Positive for numbness.  Hematological:  Negative for bruising/bleeding tendency.  Psychiatric/Behavioral:  Positive for sleep disturbance.     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[place])    Raynaud disease    Fibromyalgia    Inflammatory arthritis    Osteopenia     Past Medical History:  Diagnosis Date   Arthritis    Fibromyalgia    Macular degeneration    Left eye   Osteopenia 05/2018   T score -2.3 FRAX 11% / 1.8% overall stable from prior DEXA   RA (rheumatoid arthritis) (HCC)    Raynaud disease    Reiters syndrome    Sleep disturbance    Vaginal dryness     Family History  Problem Relation Age of Onset   Hypertension Sister    Heart disease Brother    Hyperlipidemia Brother    Heart attack Brother    Alzheimer's disease Mother    Osteoarthritis Mother    High Cholesterol Sister    Heart attack Brother    Heart disease Brother    Healthy Son    Healthy Son    Past Surgical History:  Procedure Laterality Date   BREAST SURGERY     LEFT BR LUMP-Benign   COLONOSCOPY N/A 08/04/2018   Procedure: COLONOSCOPY;  Surgeon: Danie Binder, MD;  Location: [facility];  Service: Endoscopy;  Laterality: N/A;  8:30   POLYPECTOMY  08/04/2018   Procedure: POLYPECTOMY;  Surgeon: Danie Binder, MD;  Location: [facility];  Service: Endoscopy;;   Social History   Social History Narrative   Not on file   Immunization History  Administered Date(s) Administered   Influenza,inj,Quad PF,6+ Mos 08/24/2012, 09/25/2013, 11/13/2014, 11/17/2017   Moderna Sars-Covid-2 Vaccination 12/05/2019, 01/02/2020, 08/30/2020     Objective: Vital Signs: BP (!) 147/90 (BP Location: Left Arm, Patient Position: Sitting, Cuff Size: Normal)   Pulse 77   Resp 15   Ht '5\' 4"'$  (1.626 m)   Wt 131 lb (59.4 kg)   BMI 22.49  kg/m    Physical Exam Vitals and nursing note reviewed.  Constitutional:      Appearance: She is well-developed.  HENT:     Head: Normocephalic and atraumatic.  Eyes:     Conjunctiva/sclera: Conjunctivae normal.  Cardiovascular:     Rate and Rhythm: Normal rate and regular rhythm.     Heart sounds: Normal heart sounds.  Pulmonary:     Effort: Pulmonary effort is normal.     Breath sounds: Normal breath sounds.  Abdominal:     General: Bowel sounds are normal.     Palpations: Abdomen is soft.  Musculoskeletal:     Cervical back: Normal range of motion.  Lymphadenopathy:     Cervical: No cervical adenopathy.  Skin:    General: Skin is warm and dry.     Capillary Refill: Capillary refill takes less than 2 seconds.  Neurological:     Mental Status: She is alert and oriented to person, place, and time.  Psychiatric:        Behavior: Behavior normal.      Musculoskeletal Exam: C-spine thoracic and lumbar spine with good range of motion.  She has some discomfort in the lower lumbar region.  She had no SI joint tenderness.  Shoulder joints, elbow joints, wrist joints, MCPs PIPs and DIPs and good range of motion with no synovitis.  Hip joints, knee joints, ankles, MTPs and PIPs with good range of motion with no synovitis.  She has thickening of bilateral first MTP joints.  CDAI Exam: CDAI Score: -- Patient Global: --; Provider Global: -- Swollen: --; Tender: -- Joint Exam 03/24/2022   No joint exam has been documented for this visit   There is currently no information documented on the homunculus. Go to the Rheumatology activity and complete the homunculus joint exam.  Investigation: No additional findings.  Imaging: No results found.  Recent Labs: Lab Results  Component Value Date   WBC 5.9 02/16/2022   HGB 13.6 02/16/2022   PLT 334 02/16/2022   NA 141 02/16/2022   K 4.2 02/16/2022   CL 103 02/16/2022   CO2 32 02/16/2022   GLUCOSE 105 (H) 02/16/2022   BUN 12  02/16/2022   CREATININE 0.88 02/16/2022   BILITOT 0.3 02/16/2022   ALKPHOS 84 10/10/2019   AST 21 02/16/2022   ALT 18 02/16/2022   PROT 6.9 02/16/2022   ALBUMIN 4.6 10/10/2019   CALCIUM 9.6 02/16/2022   GFRAA 84 10/22/2020   QFTBGOLDPLUS NEGATIVE 04/22/2021    Speciality Comments: TB gold neg 01/25/18 HIV, Hep, IG, SPEP neg 01/25/18  Procedures:  No procedures performed Allergies: Patient has no known allergies.   Assessment / Plan:     Visit Diagnoses: Inflammatory arthritis-she had no synovitis on examination.  She is doing well on Humira 40 mg subcu every other week  and sulfasalazine 500 mg, 2 tablets twice daily.  She denies any history of joint swelling.  High risk medication use - Humira 40 mg every 14 days and sulfasalazine 500 mg 2 tablets twice daily.  Labs obtained on Feb 16, 2022 showed normal CBC and normal CMP with GFR.  TB gold was negative on April 22, 2021.  She was advised to get labs again in August and every 3 months to monitor for drug toxicity.  TB gold will be obtained at the next labs.  Information guarding immunization was placed in the AVS.  She was also advised to hold Humira and sulfasalazine in case she develops an infection and resume after the infection resolves.  History of iritis-she denies any recurrence of iritis.  Raynaud's disease without gangrene-currently not active.  Chronic left SI joint pain-resolved since she started taking Humira.  Spondylosis of lumbar region without myelopathy or radiculopathy - MRI of lumbar spine on 03/07/20 revealed L4-L5 broad based disc bulge and L5-S1 mild broad-based bulge with small left foraminal disc protrusion.  She continues to have some lower back pain.  Core strengthening exercises were emphasized.  Fibromyalgia-she is not having much discomfort from fibromyalgia.  Osteopenia of multiple sites - DEXA updated on 02/25/21: [facility] spine BMD 0.771 with T score -2.5. DEXA 05/16/2018 [facility] spine BMD 0.799 with T score -2.3.   She is currently not taking any medications for osteoporosis.  Use of calcium rich diet and vitamin D were discussed.  Need for regular exercise was emphasized.  History of hypertension-blood pressure was elevated today.  She was advised to monitor blood pressure closely and follow-up with her PCP.  Dyslipidemia  History of macular degeneration  Orders: Orders Placed This Encounter  Procedures   QuantiFERON-TB Gold Plus   No orders of the defined types were placed in this encounter.   Follow-Up Instructions: Return in about 5 months (around 08/24/2022) for Inflammatory arthritis.   Bo Merino, MD  Note - This record has been created using Editor, commissioning.  Chart creation errors have been sought, but may not always  have been located. Such creation errors do not reflect on  the standard of medical care.

## 2022-03-24 ENCOUNTER — Ambulatory Visit: Payer: Medicare PPO | Admitting: Rheumatology

## 2022-03-24 ENCOUNTER — Encounter: Payer: Self-pay | Admitting: Rheumatology

## 2022-03-24 VITALS — BP 147/90 | HR 77 | Resp 15 | Ht 64.0 in | Wt 131.0 lb

## 2022-03-24 DIAGNOSIS — M47816 Spondylosis without myelopathy or radiculopathy, lumbar region: Secondary | ICD-10-CM

## 2022-03-24 DIAGNOSIS — M1990 Unspecified osteoarthritis, unspecified site: Secondary | ICD-10-CM | POA: Diagnosis not present

## 2022-03-24 DIAGNOSIS — E785 Hyperlipidemia, unspecified: Secondary | ICD-10-CM

## 2022-03-24 DIAGNOSIS — Z8669 Personal history of other diseases of the nervous system and sense organs: Secondary | ICD-10-CM | POA: Diagnosis not present

## 2022-03-24 DIAGNOSIS — Z79899 Other long term (current) drug therapy: Secondary | ICD-10-CM | POA: Diagnosis not present

## 2022-03-24 DIAGNOSIS — I7300 Raynaud's syndrome without gangrene: Secondary | ICD-10-CM | POA: Diagnosis not present

## 2022-03-24 DIAGNOSIS — G8929 Other chronic pain: Secondary | ICD-10-CM

## 2022-03-24 DIAGNOSIS — M533 Sacrococcygeal disorders, not elsewhere classified: Secondary | ICD-10-CM

## 2022-03-24 DIAGNOSIS — M797 Fibromyalgia: Secondary | ICD-10-CM

## 2022-03-24 DIAGNOSIS — Z8679 Personal history of other diseases of the circulatory system: Secondary | ICD-10-CM

## 2022-03-24 DIAGNOSIS — M8589 Other specified disorders of bone density and structure, multiple sites: Secondary | ICD-10-CM

## 2022-03-24 NOTE — Patient Instructions (Signed)
Standing Labs We placed an order today for your standing lab work.   Please have your standing labs drawn in August and every 3 months TB Gold with next labs  If possible, please have your labs drawn 2 weeks prior to your appointment so that the provider can discuss your results at your appointment.  Please note that you may see your imaging and lab results in [place] before we have reviewed them. We may be awaiting multiple results to interpret others before contacting you. Please allow our office up to 72 hours to thoroughly review all of the results before contacting the office for clarification of your results.  We have open lab daily: Monday through Thursday from 1:30-4:30 PM and Friday from 1:30-4:00 PM at the office of Dr. Bo Merino, [facility].   Please be advised, all patients with office appointments requiring lab work will take precedent over walk-in lab work.  If possible, please come for your lab work on Monday and Friday afternoons, as you may experience shorter wait times. The office is located at [address] [place], [place], [place] [address] No appointment is necessary.   Labs are drawn by [facility]. Please bring your co-pay at the time of your lab draw.  You may receive a bill from [facility] for your lab work.  Please note if you are on Hydroxychloroquine and and an order has been placed for a Hydroxychloroquine level, you will need to have it drawn 4 hours or more after your last dose.  If you wish to have your labs drawn at another location, please call the office 24 hours in advance to send orders.  If you have any questions regarding directions or hours of operation,  please call [phone].   As a reminder, please drink plenty of water prior to coming for your lab work. Thanks!   Vaccines You are taking a medication(s) that can suppress your immune system.  The following immunizations are recommended: Flu annually Covid-19  Td/Tdap  (tetanus, diphtheria, pertussis) every 10 years Pneumonia (Prevnar 15 then Pneumovax 23 at least 1 year apart.  Alternatively, can take Prevnar 20 without needing additional dose) Shingrix: 2 doses from 4 weeks to 6 months apart  Please check with your PCP to make sure you are up to date.   If you have signs or symptoms of an infection or start antibiotics: First, call your PCP for workup of your infection. Hold your medication through the infection, until you complete your antibiotics, and until symptoms resolve if you take the following: Injectable medication (Actemra, Benlysta, Cimzia, Cosentyx, Enbrel, Humira, Kevzara, Orencia, Remicade, Simponi, Stelara, Taltz, Tremfya) Methotrexate Leflunomide (Arava) Mycophenolate (Cellcept) Roma Kayser, or Rinvoq  Please get an annual skin exam to screen for skin cancer by the dermatologist while on Humira.

## 2022-05-17 ENCOUNTER — Other Ambulatory Visit: Payer: Self-pay | Admitting: Physician Assistant

## 2022-05-18 NOTE — Telephone Encounter (Signed)
Next Visit: 09/28/2022  Last Visit: 03/24/2022  Last Fill: 11/09/2021  XK:[id number] arthritis  Current Dose per office note 03/24/2022: Humira 40 mg every 14 days   Labs: 02/16/2022 CBC and CMP are stable.  TB Gold: 04/22/2021 Neg    Attempted to contact the patient and left message for patient to advise she is due to update labs.   Okay to refill Humira?

## 2022-05-27 ENCOUNTER — Telehealth: Payer: Self-pay | Admitting: Rheumatology

## 2022-05-27 ENCOUNTER — Other Ambulatory Visit: Payer: Self-pay | Admitting: *Deleted

## 2022-05-27 DIAGNOSIS — Z79899 Other long term (current) drug therapy: Secondary | ICD-10-CM

## 2022-05-27 NOTE — Telephone Encounter (Signed)
Lab Orders released.  

## 2022-05-27 NOTE — Telephone Encounter (Signed)
Patient called the office requesting lab orders [facility] on [address] in [place]. Patient plans on going next week.

## 2022-06-04 NOTE — Progress Notes (Signed)
CBC and CMP normal

## 2022-06-06 LAB — CBC WITH DIFFERENTIAL/PLATELET
Absolute Monocytes: 512 cells/uL (ref 200–950)
Basophils Absolute: 9 cells/uL (ref 0–200)
Basophils Relative: 0.2 %
Eosinophils Absolute: 42 cells/uL (ref 15–500)
Eosinophils Relative: 0.9 %
HCT: 40.6 % (ref 35.0–45.0)
Hemoglobin: 13.9 g/dL (ref 11.7–15.5)
Lymphs Abs: 1711 cells/uL (ref 850–3900)
MCH: 30.9 pg (ref 27.0–33.0)
MCHC: 34.2 g/dL (ref 32.0–36.0)
MCV: 90.2 fL (ref 80.0–100.0)
MPV: 8.8 fL (ref 7.5–12.5)
Monocytes Relative: 10.9 %
Neutro Abs: 2425 cells/uL (ref 1500–7800)
Neutrophils Relative %: 51.6 %
Platelets: 295 10*3/uL (ref 140–400)
RBC: 4.5 10*6/uL (ref 3.80–5.10)
RDW: 11.9 % (ref 11.0–15.0)
Total Lymphocyte: 36.4 %
WBC: 4.7 10*3/uL (ref 3.8–10.8)

## 2022-06-06 LAB — COMPLETE METABOLIC PANEL WITH GFR
AG Ratio: 1.7 (calc) (ref 1.0–2.5)
ALT: 10 U/L (ref 6–29)
AST: 18 U/L (ref 10–35)
Albumin: 4.4 g/dL (ref 3.6–5.1)
Alkaline phosphatase (APISO): 83 U/L (ref 37–153)
BUN: 13 mg/dL (ref 7–25)
CO2: 26 mmol/L (ref 20–32)
Calcium: 9.6 mg/dL (ref 8.6–10.4)
Chloride: 103 mmol/L (ref 98–110)
Creat: 0.88 mg/dL (ref 0.50–1.05)
Globulin: 2.6 g/dL (calc) (ref 1.9–3.7)
Glucose, Bld: 90 mg/dL (ref 65–99)
Potassium: 4.5 mmol/L (ref 3.5–5.3)
Sodium: 140 mmol/L (ref 135–146)
Total Bilirubin: 0.5 mg/dL (ref 0.2–1.2)
Total Protein: 7 g/dL (ref 6.1–8.1)
eGFR: 73 mL/min/{1.73_m2} (ref >=60)

## 2022-06-06 LAB — QUANTIFERON-TB GOLD PLUS
Mitogen-NIL: >10 IU/mL
NIL: 0.03 IU/mL
QuantiFERON-TB Gold Plus: NEGATIVE
TB1-NIL: 0 IU/mL
TB2-NIL: 0.05 IU/mL

## 2022-06-07 NOTE — Progress Notes (Signed)
TB Gold is negative.

## 2022-06-09 ENCOUNTER — Other Ambulatory Visit (HOSPITAL_COMMUNITY): Payer: Self-pay | Admitting: Nurse Practitioner

## 2022-06-09 DIAGNOSIS — Z1231 Encounter for screening mammogram for malignant neoplasm of breast: Secondary | ICD-10-CM

## 2022-06-15 ENCOUNTER — Other Ambulatory Visit: Payer: Self-pay | Admitting: Rheumatology

## 2022-06-15 NOTE — Telephone Encounter (Signed)
Next Visit: 09/28/2022   Last Visit: 03/24/2022   Last Fill: 05/18/2022 (30 day supply0   XN:[id number] arthritis   Current Dose per office note 03/24/2022: Humira 40 mg every 14 days    Labs: 06/03/2022 CBC and CMP normal.   TB Gold: 06/03/2022 Neg     Okay to refill Humira?

## 2022-06-23 ENCOUNTER — Ambulatory Visit (HOSPITAL_COMMUNITY)
Admission: RE | Admit: 2022-06-23 | Discharge: 2022-06-23 | Disposition: A | Discharge status: Home / Self Care | Payer: Medicare PPO | Source: Ambulatory Visit | Attending: Nurse Practitioner | Admitting: Nurse Practitioner

## 2022-06-23 DIAGNOSIS — Z1231 Encounter for screening mammogram for malignant neoplasm of breast: Secondary | ICD-10-CM | POA: Diagnosis present

## 2022-06-24 ENCOUNTER — Ambulatory Visit (INDEPENDENT_AMBULATORY_CARE_PROVIDER_SITE_OTHER): Payer: Medicare PPO | Admitting: Nurse Practitioner

## 2022-06-24 ENCOUNTER — Encounter: Payer: Self-pay | Admitting: Nurse Practitioner

## 2022-06-24 ENCOUNTER — Other Ambulatory Visit (HOSPITAL_COMMUNITY)
Admission: RE | Admit: 2022-06-24 | Discharge: 2022-06-24 | Disposition: A | Discharge status: Home / Self Care | Payer: Medicare PPO | Source: Ambulatory Visit | Attending: Nurse Practitioner | Admitting: Nurse Practitioner

## 2022-06-24 VITALS — BP 122/82 | HR 81 | Ht 64.0 in | Wt 130.0 lb

## 2022-06-24 DIAGNOSIS — Z1151 Encounter for screening for human papillomavirus (HPV): Secondary | ICD-10-CM | POA: Diagnosis not present

## 2022-06-24 DIAGNOSIS — N951 Menopausal and female climacteric states: Secondary | ICD-10-CM

## 2022-06-24 DIAGNOSIS — M810 Age-related osteoporosis without current pathological fracture: Secondary | ICD-10-CM

## 2022-06-24 DIAGNOSIS — Z01419 Encounter for gynecological examination (general) (routine) without abnormal findings: Secondary | ICD-10-CM | POA: Diagnosis present

## 2022-06-24 DIAGNOSIS — Z780 Asymptomatic menopausal state: Secondary | ICD-10-CM

## 2022-06-24 DIAGNOSIS — N941 Unspecified dyspareunia: Secondary | ICD-10-CM | POA: Diagnosis not present

## 2022-06-24 DIAGNOSIS — L900 Lichen sclerosus et atrophicus: Secondary | ICD-10-CM | POA: Diagnosis not present

## 2022-06-24 MED ORDER — ESTRADIOL 0.1 MG/GM VA CREA: 1.0000 | TOPICAL_CREAM | VAGINAL | 3 refills | Status: DC

## 2022-06-24 MED ORDER — CLOBETASOL PROPIONATE 0.05 % EX OINT: TOPICAL_OINTMENT | CUTANEOUS | 2 refills | Status: DC

## 2022-06-24 NOTE — Progress Notes (Signed)
Monica [name] September 20, 1956 [id number]   History:  66 y.o. [id number] presents for annual exam. Postmenopausal - no HRT, no bleeding. Complains of severe pain with intercourse. She has not been sexually active in 3 years due to this and would like to be. Normal pap and mammogram history. H/O lichen sclerosus, using Clobetasol with good management. On Humira for RA.   Gynecologic History No LMP recorded. Patient is postmenopausal.   Contraception/Family planning: post menopausal status Sexually active: Yes  Health Maintenance Last Pap: 12/26/2017. Results were: Normal neg HPV Last mammogram: 06/23/2021. Results were: Normal Last colonoscopy: 08/04/2018. Results were: polyps, 7-year recall Last Dexa: 02/25/2021. Results were: T-score -2.5  Past medical history, past surgical history, family history and social history were all reviewed and documented in the EPIC chart. Married. Retired.   ROS:  A ROS was performed and pertinent positives and negatives are included.  Exam:  Vitals:   06/24/22 1343  BP: 122/82  Pulse: 81  SpO2: 96%  Weight: 130 lb (59 kg)  Height: '5\' 4"'$  (1.626 m)    Body mass index is 22.31 kg/m.  General appearance:  Normal Thyroid:  Symmetrical, normal in size, without palpable masses or nodularity. Respiratory  Auscultation:  Clear without wheezing or rhonchi Cardiovascular  Auscultation:  Regular rate, without rubs, murmurs or gallops  Edema/varicosities:  Not grossly evident Abdominal  Soft,nontender, without masses, guarding or rebound.  Liver/spleen:  No organomegaly noted  Hernia:  None appreciated  Skin  Inspection:  Grossly normal Breasts: Examined lying and sitting.   Right: Without masses, retractions, nipple discharge or axillary adenopathy.   Left: Without masses, retractions, nipple discharge or axillary adenopathy. Gentitourinary   Inguinal/mons:  Normal without inguinal adenopathy  External genitalia:  Mild hypopigmentation c/w LS. No  masses, tenderness, or lesions  BUS/Urethra/Skene's glands:  Normal  Vagina:  Normal appearing with normal color and discharge, no lesions. Atrophic changes  Cervix:  Normal appearing without discharge or lesions  Uterus:  Normal in size, shape and contour.  Midline and mobile, nontender  Adnexa/parametria:     Rt: Normal in size, without masses or tenderness.   Lt: Normal in size, without masses or tenderness.  Anus and perineum: Nonbleeding hemorrhoids  Digital rectal exam: Normal sphincter tone without palpated masses or tenderness  Patient informed chaperone available to be present for breast and pelvic exam. Patient has requested no chaperone to be present. Patient has been advised what will be completed during breast and pelvic exam.   Assessment/Plan:  66 y.o. [id number] for annual exam.    Well female exam with routine gynecological exam - Plan: Cytology - PAP( [facility]). Education provided on SBEs, importance of preventative screenings, current guidelines, high calcium diet, regular exercise, and multivitamin daily. Labs with PCP and rheumatologist.    Postmenopausal - No HRT, no bleeding.  Age-related osteoporosis without current pathological fracture - T-score -2.5 in May 2022. Continue Vitamin D + Calcium supplement and exercise. She walks 2-3 miles most days. Plans to increase weight training. Will repeat DXA next May.   Lichen sclerosus - Plan: clobetasol ointment (TEMOVATE) 0.05 % twice weekly. Good management.   Menopausal vaginal dryness - Plan: estradiol (ESTRACE VAGINAL) 0.1 MG/GM vaginal cream twice weekly. Also recommend using coconut oil for lubricant.   Dyspareunia in female - Plan: estradiol (ESTRACE VAGINAL) 0.1 MG/GM vaginal cream twice weekly. Will use nightly x 2 weeks, then every other night x 2 weeks, then twice weekly. Severe pain with intercourse. Has not been  sexually active in 3 years due to this and wants to start. Discussed estrogen use, vaginal dilators,  pelvic floor PT. Aware of risk for small amount of systemic absorption.   Screening for cervical cancer - Normal Pap history.  Pap today. If normal we discussed discontinuing screenings per guidelines.   Screening for breast cancer - Normal mammogram history.  Continue annual screenings.  Normal breast exam today.  Screening for colon cancer - 2019 colonoscopy. Will repeat at GI's recommended interval.   Return in 1 year for medication follow up.      Tamela Gammon DNP, 2:06 PM 06/24/2022

## 2022-06-30 LAB — CYTOLOGY - PAP
Comment: NEGATIVE
Diagnosis: NEGATIVE
High risk HPV: NEGATIVE

## 2022-08-09 ENCOUNTER — Other Ambulatory Visit: Payer: Self-pay | Admitting: Physician Assistant

## 2022-08-09 NOTE — Telephone Encounter (Signed)
Next Visit: 09/28/2022  Last Visit: 03/24/2022  Last Fill: 02/18/2022  DX: Inflammatory arthritis  Current Dose per office note 03/24/2022: sulfasalazine 500 mg 2 tablets twice daily  Labs: 06/03/2022 CBC and CMP normal.  Okay to refill SSZ?

## 2022-08-25 ENCOUNTER — Other Ambulatory Visit: Payer: Self-pay | Admitting: Physician Assistant

## 2022-09-07 ENCOUNTER — Other Ambulatory Visit: Payer: Self-pay | Admitting: Physician Assistant

## 2022-09-16 ENCOUNTER — Other Ambulatory Visit: Payer: Self-pay

## 2022-09-16 MED ORDER — HUMIRA (2 PEN) 40 MG/0.4ML ~~LOC~~ AJKT: AUTO-INJECTOR | SUBCUTANEOUS | 0 refills | Status: DC

## 2022-09-16 NOTE — Progress Notes (Signed)
Office Visit Note  Patient: Monica [name]             Date of Birth: 1955-10-14           MRN: [id number]             PCP: Lemmie Evens, MD Referring: Lemmie Evens, MD Visit Date: 09/28/2022 Occupation: '@GUAROCC'$ @  Subjective:  Medication monitoring  History of Present Illness: Monica [name] is a 66 y.o. female with history of inflammatory arthritis. She remains on Humira 40 mg every 14 days and sulfasalazine 500 mg 1 tablet twice daily.  she is tolerating combination therapy without any side effects.  According to the patient she was told that she could discontinue sulfasalazine at her last office visit.  She states that she first try reducing the dose but then ended up missing several consecutive doses but started to notice increased redness in her right eye as well as pain and stiffness in both hands.  She has resumed sulfasalazine 500 mg 1 tablet twice daily which has been alleviating her symptoms.  She denies any signs or symptoms of a flare recently.  She states that she has not been walking as frequently for exercise due to the colder weather temperatures so she has had some increased stiffness in her lower back.  She has not been experiencing any nocturnal pain.  She denies any joint swelling at this time.  She continues to use Voltaren gel topically as needed for pain relief.  She is also been is seeing his massage therapist on a regular basis.  She is considering proceeding with dry needling in the future. She denies any recent or recurrent infections.    Activities of Daily Living:  Patient reports morning stiffness for 30 minutes  Patient Denies nocturnal pain.  Difficulty dressing/grooming: Denies Difficulty climbing stairs: Denies Difficulty getting out of chair: Denies Difficulty using hands for taps, buttons, cutlery, and/or writing: Denies  Review of Systems  Constitutional:  Negative for fatigue.  HENT:  Positive for mouth sores and mouth dryness. Negative  for nose dryness.   Eyes:  Positive for dryness. Negative for pain and visual disturbance.  Respiratory:  Negative for cough, hemoptysis, shortness of breath and difficulty breathing.   Cardiovascular:  Negative for chest pain, palpitations, hypertension and swelling in legs/feet.  Gastrointestinal:  Negative for blood in stool, constipation and diarrhea.  Endocrine: Negative for increased urination.  Genitourinary:  Negative for painful urination.  Musculoskeletal:  Positive for joint pain, joint pain and morning stiffness. Negative for joint swelling, myalgias, muscle weakness, muscle tenderness and myalgias.  Skin:  Negative for color change, pallor, rash, hair loss, nodules/bumps, skin tightness, ulcers and sensitivity to sunlight.  Neurological:  Negative for dizziness, numbness, headaches and weakness.  Hematological:  Negative for swollen glands.  Psychiatric/Behavioral:  Negative for depressed mood and sleep disturbance. The patient is not nervous/anxious.     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[place])    Raynaud disease    Fibromyalgia    Inflammatory arthritis    Osteopenia     Past Medical History:  Diagnosis Date   Arthritis    Fibromyalgia    Macular degeneration    Left eye   Osteopenia 05/2018   T score -2.3 FRAX 11% / 1.8% overall stable from prior DEXA   RA (rheumatoid arthritis) (HCC)    Raynaud disease    Reiters syndrome    Sleep disturbance    Vaginal dryness     Family History  Problem Relation Age of Onset   Hypertension Sister    Heart disease Brother     Hyperlipidemia Brother    Heart attack Brother    Alzheimer's disease Mother    Osteoarthritis Mother    High Cholesterol Sister    Heart attack Brother    Heart disease Brother    Healthy Son    Healthy Son    Past Surgical History:  Procedure Laterality Date   BREAST SURGERY     LEFT BR LUMP-Benign   COLONOSCOPY N/A 08/04/2018   Procedure: COLONOSCOPY;  Surgeon: Danie Binder, MD;  Location: [facility];  Service: Endoscopy;  Laterality: N/A;  8:30   POLYPECTOMY  08/04/2018   Procedure: POLYPECTOMY;  Surgeon: Danie Binder, MD;  Location: [facility];  Service: Endoscopy;;   Social History   Social History Narrative   Not on file   Immunization History  Administered Date(s) Administered   Influenza,inj,Quad PF,6+ Mos 08/24/2012, 09/25/2013, 11/13/2014, 11/17/2017   Moderna Sars-Covid-2 Vaccination 12/05/2019, 01/02/2020, 08/30/2020     Objective: Vital Signs: BP (!) 152/88 (BP Location: Left Arm, Patient Position: Sitting, Cuff Size: Normal)   Pulse 74   Resp 14   Ht '5\' 4"'$  (1.626 m)   Wt 130 lb (59 kg)   BMI 22.31 kg/m    Physical Exam Vitals and nursing note reviewed.  Constitutional:      Appearance: She is well-developed.  HENT:     Head: Normocephalic and atraumatic.  Eyes:     Conjunctiva/sclera: Conjunctivae normal.  Cardiovascular:     Rate and Rhythm: Normal rate and regular rhythm.     Heart sounds: Normal heart sounds.  Pulmonary:     Effort: Pulmonary effort is normal.     Breath sounds: Normal breath sounds.  Abdominal:     General: Bowel sounds are normal.     Palpations: Abdomen is soft.  Musculoskeletal:     Cervical back: Normal range of motion.  Skin:    General: Skin is warm and dry.     Capillary Refill: Capillary refill takes less than 2 seconds.  Neurological:     Mental Status: She is alert and oriented to person, place, and time.  Psychiatric:        Behavior: Behavior normal.      Musculoskeletal Exam: C-spine,  thoracic spine, and lumbar spine good ROM.  No midline spinal tenderness.  No SI joint tenderness currently. Shoulder joints, elbow joints, wrist joints, MCPs, PIPs, and DIPs good ROM with no synovitis.  PIP and DIP thickening consistent with osteoarthritis of both hands.  Complete fist formation bilaterally.  Hip joints have good range of motion with no groin pain.  Knee joints have good range of motion with no warmth or effusion.  Ankle joints have good range of motion with no tenderness or joint swelling.  No evidence of Achilles tendinitis or plantar fasciitis.  No tenderness or synovitis of MTP joints.   CDAI Exam: CDAI Score: -- Patient Global: --; Provider Global: -- Swollen: --; Tender: -- Joint Exam 09/28/2022   No joint  exam has been documented for this visit   There is currently no information documented on the homunculus. Go to the Rheumatology activity and complete the homunculus joint exam.  Investigation: No additional findings.  Imaging: No results found.  Recent Labs: Lab Results  Component Value Date   WBC 4.7 06/03/2022   HGB 13.9 06/03/2022   PLT 295 06/03/2022   NA 140 06/03/2022   K 4.5 06/03/2022   CL 103 06/03/2022   CO2 26 06/03/2022   GLUCOSE 90 06/03/2022   BUN 13 06/03/2022   CREATININE 0.88 06/03/2022   BILITOT 0.5 06/03/2022   ALKPHOS 84 10/10/2019   AST 18 06/03/2022   ALT 10 06/03/2022   PROT 7.0 06/03/2022   ALBUMIN 4.6 10/10/2019   CALCIUM 9.6 06/03/2022   GFRAA 84 10/22/2020   QFTBGOLDPLUS NEGATIVE 06/03/2022    Speciality Comments: TB gold neg 01/25/18 HIV, Hep, IG, SPEP neg 01/25/18  Procedures:  No procedures performed Allergies: Patient has no known allergies.   Assessment / Plan:     Visit Diagnoses: Inflammatory arthritis: She has no synovitis or dactylitis on examination today.  She is currently on Humira 40 mg subcutaneous injections once every 14 days and sulfasalazine 500 mg 1 tablet twice daily.  She has been tolerating  combination therapy without any side effects or injection site reactions from Humira.  According to the patient she was told at her last office visit that she could discontinue sulfasalazine.  She tried reducing the dose to 1 tablet twice daily but then ended up missing several consecutive days, and noticed increased redness in the right eye as well as increased pain and stiffness in her hands.  She has resumed sulfasalazine 500 mg 1 tablet twice daily and remains on Humira as prescribed.  Her symptoms have been stable on the current treatment regimen.  She is not experiencing any increased morning stiffness or nocturnal pain.  She uses Voltaren gel topically as needed for pain relief.  She will remain on the current treatment regimen.  She was advised to notify us if she develops signs or symptoms of a flare.  She will follow-up in the office in 5 months or sooner if needed.  High risk medication use - Humira 40 mg every 14 days and sulfasalazine 500 mg 2 tablets twice daily. - Plan: CBC with Differential/Platelet, COMPLETE METABOLIC PANEL WITH GFR TB gold negative on 06/03/22.  CBC and CMP-WNL updated on 06/03/22.  Orders for CBC and CMP were released today.  Her next lab work will be due in March and every 3 months to monitor for drug toxicity. She has not had any recent or recurrent infections.  Discussed the importance of holding Humira and sulfasalazine if she develops signs or symptoms of an infection and to resume once the infection has completely cleared.   History of iritis: Patient tried tapering off of sulfasalazine but developed increased redness in the right eye.  Her symptoms resolved after resuming sulfasalazine 500 mg 1 tablet twice daily.  She will remain on Humira and sulfasalazine as combination therapy.  She was advised to notify us if she develops signs or symptoms of a flare.  Raynaud's disease without gangrene: Not currently active.  Chronic left SI joint pain - Resolved.  No SI  joint tenderness or nocturnal pain.   Spondylosis of lumbar region without myelopathy or radiculopathy - MRI of lumbar spine on 03/07/20 revealed L4-L5 broad based disc bulge and L5-S1 mild broad-based bulge with small left foraminal disc  protrusion.  She has been experiencing some increased stiffness in her lower back which she attributes to disrupting her walking regimen with the colder weather temperatures.  She had no midline spinal tenderness on examination today.  She has been seeing a massage therapist which has been helpful.  She is considering trying dry needling in the future.  Osteopenia of multiple sites - DEXA updated on 02/25/21: [facility] spine BMD 0.771 with T score -2.5. DEXA 05/16/2018 [facility] spine BMD 0.799 with T score -2.3.  She is taking a calcium and vitamin D supplement daily.  Fibromyalgia: She experiences intermittent myalgias and muscle tenderness due to fibromyalgia.  She continues to stretch on a daily basis for exercise.  She also typically walks for exercise. She has been following up with a massage therapist. She is considering proceeding with dry needling in the future.  Discussed the importance of regular exercise and good sleep hygiene.   Other medical conditions are listed as follows:  History of hypertension: Blood pressure was 152/88 today in the office.  Dyslipidemia  History of macular degeneration  Orders: Orders Placed This Encounter  Procedures   CBC with Differential/Platelet   COMPLETE METABOLIC PANEL WITH GFR   No orders of the defined types were placed in this encounter.    Follow-Up Instructions: Return in about 5 months (around 02/27/2023) for Inflammatory arthritis .   Ofilia Neas, PA-C  Note - This record has been created using Dragon software.  Chart creation errors have been sought, but may not always  have been located. Such creation errors do not reflect on  the standard of medical care.

## 2022-09-16 NOTE — Telephone Encounter (Signed)
[facility] called for a refill of humira.   Next Visit: 09/28/2022  Last Visit: 03/24/2022  Last Fill: 06/15/2022  GN:[id number] arthritis   Current Dose per office note on 03/24/2022: Humira 40 mg every 14 days   Labs: 06/03/2022 CBC and CMP normal.   TB Gold: 06/03/2022 negative    Advised patient she is due to update labs and patient will update at appointment on 09/28/2022 due to insurance covering labs here and not the stand alone lab.   Okay to refill humira?

## 2022-09-28 ENCOUNTER — Encounter: Payer: Self-pay | Admitting: Physician Assistant

## 2022-09-28 ENCOUNTER — Ambulatory Visit: Payer: Medicare PPO | Attending: Physician Assistant | Admitting: Physician Assistant

## 2022-09-28 VITALS — BP 152/88 | HR 74 | Resp 14 | Ht 64.0 in | Wt 130.0 lb

## 2022-09-28 DIAGNOSIS — Z8679 Personal history of other diseases of the circulatory system: Secondary | ICD-10-CM

## 2022-09-28 DIAGNOSIS — Z8669 Personal history of other diseases of the nervous system and sense organs: Secondary | ICD-10-CM | POA: Diagnosis not present

## 2022-09-28 DIAGNOSIS — M533 Sacrococcygeal disorders, not elsewhere classified: Secondary | ICD-10-CM

## 2022-09-28 DIAGNOSIS — M1990 Unspecified osteoarthritis, unspecified site: Secondary | ICD-10-CM | POA: Diagnosis not present

## 2022-09-28 DIAGNOSIS — Z79899 Other long term (current) drug therapy: Secondary | ICD-10-CM | POA: Diagnosis not present

## 2022-09-28 DIAGNOSIS — E785 Hyperlipidemia, unspecified: Secondary | ICD-10-CM

## 2022-09-28 DIAGNOSIS — I7300 Raynaud's syndrome without gangrene: Secondary | ICD-10-CM

## 2022-09-28 DIAGNOSIS — M8589 Other specified disorders of bone density and structure, multiple sites: Secondary | ICD-10-CM

## 2022-09-28 DIAGNOSIS — M797 Fibromyalgia: Secondary | ICD-10-CM

## 2022-09-28 DIAGNOSIS — M47816 Spondylosis without myelopathy or radiculopathy, lumbar region: Secondary | ICD-10-CM

## 2022-09-28 DIAGNOSIS — G8929 Other chronic pain: Secondary | ICD-10-CM

## 2022-09-28 NOTE — Patient Instructions (Signed)
Standing Labs We placed an order today for your standing lab work.   Please have your standing labs drawn in march and every 3 months   Please have your labs drawn 2 weeks prior to your appointment so that the provider can discuss your lab results at your appointment.  Please note that you may see your imaging and lab results in [place] before we have reviewed them. We will contact you once all results are reviewed. Please allow our office up to 72 hours to thoroughly review all of the results before contacting the office for clarification of your results.  Lab hours are:   Monday through Thursday from 8:00 am -12:30 pm and 1:00 pm-5:00 pm and Friday from 8:00 am-12:00 pm.  Please be advised, all patients with office appointments requiring lab work will take precedent over walk-in lab work.   Labs are drawn by [facility]. Please bring your co-pay at the time of your lab draw.  You may receive a bill from [facility] for your lab work.  Please note if you are on Hydroxychloroquine and and an order has been placed for a Hydroxychloroquine level, you will need to have it drawn 4 hours or more after your last dose.  If you wish to have your labs drawn at another location, please call the office 24 hours in advance so we can fax the orders.  The office is located at [address] [place], [place], [place] [address] No appointment is necessary.    If you have any questions regarding directions or hours of operation,  please call [phone].   As a reminder, please drink plenty of water prior to coming for your lab work. Thanks!

## 2022-09-29 LAB — COMPLETE METABOLIC PANEL WITH GFR
AG Ratio: 1.6 (calc) (ref 1.0–2.5)
ALT: 18 U/L (ref 6–29)
AST: 24 U/L (ref 10–35)
Albumin: 4.4 g/dL (ref 3.6–5.1)
Alkaline phosphatase (APISO): 79 U/L (ref 37–153)
BUN: 13 mg/dL (ref 7–25)
CO2: 29 mmol/L (ref 20–32)
Calcium: 9.5 mg/dL (ref 8.6–10.4)
Chloride: 103 mmol/L (ref 98–110)
Creat: 0.86 mg/dL (ref 0.50–1.05)
Globulin: 2.7 g/dL (calc) (ref 1.9–3.7)
Glucose, Bld: 95 mg/dL (ref 65–99)
Potassium: 4.2 mmol/L (ref 3.5–5.3)
Sodium: 140 mmol/L (ref 135–146)
Total Bilirubin: 0.3 mg/dL (ref 0.2–1.2)
Total Protein: 7.1 g/dL (ref 6.1–8.1)
eGFR: 75 mL/min/{1.73_m2} (ref >=60)

## 2022-09-29 LAB — CBC WITH DIFFERENTIAL/PLATELET
Absolute Monocytes: 572 cells/uL (ref 200–950)
Basophils Absolute: 33 cells/uL (ref 0–200)
Basophils Relative: 0.5 %
Eosinophils Absolute: 52 cells/uL (ref 15–500)
Eosinophils Relative: 0.8 %
HCT: 42.3 % (ref 35.0–45.0)
Hemoglobin: 14.1 g/dL (ref 11.7–15.5)
Lymphs Abs: 2236 cells/uL (ref 850–3900)
MCH: 30.4 pg (ref 27.0–33.0)
MCHC: 33.3 g/dL (ref 32.0–36.0)
MCV: 91.2 fL (ref 80.0–100.0)
MPV: 8.8 fL (ref 7.5–12.5)
Monocytes Relative: 8.8 %
Neutro Abs: 3608 cells/uL (ref 1500–7800)
Neutrophils Relative %: 55.5 %
Platelets: 331 10*3/uL (ref 140–400)
RBC: 4.64 10*6/uL (ref 3.80–5.10)
RDW: 12.1 % (ref 11.0–15.0)
Total Lymphocyte: 34.4 %
WBC: 6.5 10*3/uL (ref 3.8–10.8)

## 2022-09-29 NOTE — Progress Notes (Signed)
CBC and CMP WNL

## 2022-11-01 ENCOUNTER — Other Ambulatory Visit: Payer: Self-pay | Admitting: Physician Assistant

## 2022-11-01 NOTE — Telephone Encounter (Signed)
Next Visit: 03/24/2023  Last Visit: 09/28/2022  Last Fill: 09/30/2021  Dx: Chronic left SI joint pain   Current Dose per office note on 09/28/2022: not discussed   Okay to refill Voltaren Gel?

## 2022-11-25 NOTE — Progress Notes (Addendum)
Office Visit Note  Patient: Monica [name]             Date of Birth: 1955-10-28           MRN: [id number]             PCP: Lemmie Evens, MD Referring: Lemmie Evens, MD Visit Date: 11/29/2022 Occupation: @GUAROCC$ @  Subjective:  Lower back pain  History of Present Illness: Monica [name] is a 67 y.o. female with history of inflammatory arthritis.  She states she came off sulfasalazine for about 6 months and restarted in December.  She states she had a flare while she was off and sulfasalazine.  She has felt better since she has been on the combination of Humira and sulfasalazine.  She has been taking sulfasalazine 500 mg 2 tablets twice a day and Humira 40 mg subcu every 14 days without any interruption now.  She states she has been experiencing lower back pain for 1 year now.  She states since the weather has changed she has not been walking as much and has been sitting more which is exacerbated her lower back pain.  The pain starts in her lower back and sometimes radiate into her left gluteal region.  She denies any discomfort in the SI joint.  She has noticed some redness in her eyes.  She denies any eye discomfort.  She states she has been taking multivitamin but not calcium and vitamin D supplements.  She also has noticed more Raynauds symptoms during the winter months.  She states she avoids walking due to Raynauds.  She continues to have some generalized pain and discomfort from fibromyalgia.    Activities of Daily Living:  Patient reports morning stiffness for 40 minutes.   Patient Reports nocturnal pain.  Difficulty dressing/grooming: Denies Difficulty climbing stairs: Denies Difficulty getting out of chair: Reports Difficulty using hands for taps, buttons, cutlery, and/or writing: Reports  Review of Systems  Constitutional:  Negative for fatigue.  HENT:  Negative for mouth sores and mouth dryness.   Eyes:  Positive for dryness.  Respiratory:  Negative for shortness  of breath.   Cardiovascular:  Negative for chest pain and palpitations.  Gastrointestinal:  Negative for blood in stool, constipation and diarrhea.  Endocrine: Negative for increased urination.  Genitourinary:  Negative for involuntary urination.  Musculoskeletal:  Positive for joint pain, joint pain, morning stiffness and muscle tenderness. Negative for gait problem, joint swelling, myalgias, muscle weakness and myalgias.  Skin:  Negative for color change, rash, hair loss and sensitivity to sunlight.  Allergic/Immunologic: Negative for susceptible to infections.  Neurological:  Negative for dizziness and headaches.  Hematological:  Negative for swollen glands.  Psychiatric/Behavioral:  Positive for sleep disturbance. Negative for depressed mood. The patient is not nervous/anxious.     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[place])    Raynaud disease    Fibromyalgia    Inflammatory arthritis    Osteopenia     Past Medical History:  Diagnosis Date   Arthritis    Fibromyalgia    Macular degeneration    Left eye  Osteopenia 05/2018   T score -2.3 FRAX 11% / 1.8% overall stable from prior DEXA   RA (rheumatoid arthritis) (HCC)    Raynaud disease    Reiters syndrome    Sleep disturbance    Vaginal dryness     Family History  Problem Relation Age of Onset   Hypertension Sister    Heart disease Brother    Hyperlipidemia Brother    Heart attack Brother    Alzheimer's disease Mother    Osteoarthritis Mother    High Cholesterol Sister    Heart attack Brother    Heart disease Brother     Healthy Son    Healthy Son    Past Surgical History:  Procedure Laterality Date   BREAST SURGERY     LEFT BR LUMP-Benign   COLONOSCOPY N/A 08/04/2018   Procedure: COLONOSCOPY;  Surgeon: West Bali, MD;  Location: [facility];  Service: Endoscopy;  Laterality: N/A;  8:30   POLYPECTOMY  08/04/2018   Procedure: POLYPECTOMY;  Surgeon: West Bali, MD;  Location: [facility];  Service: Endoscopy;;   Social History   Social History Narrative   Not on file   Immunization History  Administered Date(s) Administered   Influenza,inj,Quad PF,6+ Mos 08/24/2012, 09/25/2013, 11/13/2014, 11/17/2017   Moderna Sars-Covid-2 Vaccination 12/05/2019, 01/02/2020, 08/30/2020     Objective: Vital Signs: BP 128/81 (BP Location: Left Arm, Patient Position: Sitting, Cuff Size: Normal)   Pulse 73   Resp 14   Ht 5\' 4"  (1.626 m)   Wt 133 lb (60.3 kg)   BMI 22.83 kg/m    Physical Exam Vitals and nursing note reviewed.  Constitutional:      Appearance: She is well-developed.  HENT:     Head: Normocephalic and atraumatic.  Eyes:     Conjunctiva/sclera: Conjunctivae normal.  Cardiovascular:     Rate and Rhythm: Normal rate and regular rhythm.     Heart sounds: Normal heart sounds.  Pulmonary:     Effort: Pulmonary effort is normal.     Breath sounds: Normal breath sounds.  Abdominal:     General: Bowel sounds are normal.     Palpations: Abdomen is soft.  Musculoskeletal:     Cervical back: Normal range of motion.  Lymphadenopathy:     Cervical: No cervical adenopathy.  Skin:    General: Skin is warm and dry.     Capillary Refill: Capillary refill takes less than 2 seconds.  Neurological:     Mental Status: She is alert and oriented to person, place, and time.  Psychiatric:        Behavior: Behavior normal.      Musculoskeletal Exam: Cervical spine was in good range of motion with some stiffness.  She had discomfort range of motion of her lumbar spine.  She had tenderness in  the lumbar region and on the left lateral paravertebral region.  There was no SI joint tenderness.  Shoulders, elbows, wrists, MCPs PIPs and DIPs were in good range of motion.  She had bilateral CMC PIP and DIP thickening.  Hip joints and knee joints were in good range of motion.  There was no tenderness over ankles or MTPs.  There was no Achilles tendinitis of [place] fasciitis.  CDAI Exam: CDAI Score: -- Patient Global: 3 mm; Provider Global: 3 mm Swollen: --; Tender: -- Joint Exam 11/29/2022   No joint exam has been documented for this visit   There is currently no information documented on the homunculus. Go to  the Rheumatology activity and complete the homunculus joint exam.  Investigation: No additional findings.  Imaging: No results found.  Recent Labs: Lab Results  Component Value Date   WBC 6.5 09/28/2022   HGB 14.1 09/28/2022   PLT 331 09/28/2022   NA 140 09/28/2022   K 4.2 09/28/2022   CL 103 09/28/2022   CO2 29 09/28/2022   GLUCOSE 95 09/28/2022   BUN 13 09/28/2022   CREATININE 0.86 09/28/2022   BILITOT 0.3 09/28/2022   ALKPHOS 84 10/10/2019   AST 24 09/28/2022   ALT 18 09/28/2022   PROT 7.1 09/28/2022   ALBUMIN 4.6 10/10/2019   CALCIUM 9.5 09/28/2022   GFRAA 84 10/22/2020   QFTBGOLDPLUS NEGATIVE 06/03/2022    Speciality Comments: TB gold neg 01/25/18 HIV, Hep, IG, SPEP neg 01/25/18  Procedures:  No procedures performed Allergies: Patient has no known allergies.   Assessment / Plan:     Visit Diagnoses: Inflammatory arthritis-patient reports having a flare about 6 months ago while she was off sulfasalazine.  She has been taking Humira and sulfasalazine on a regular basis now and has not had a major flare.  She has been tolerating both medications without any side effects.  She has been taking sulfasalazine 500 mg, 2 tablets p.o. twice daily without any interruption since the last visit.  No synovitis was noted on the examination today.  There was no plantar  fasciitis or Achilles tendinitis.  High risk medication use - Humira 40 mg every 14 days and sulfasalazine 500 mg 2 tablets twice daily.  Labs obtained on September 28, 2022 CBC and CMP were normal.  TB Gold was negative on June 03, 2022.  She was advised to get labs in March and every 3 months to monitor for drug toxicity.  TB Gold will be checked annually.  She was advised to stop Humira and sulfasalazine if she develops an infection resume after the infection resolves.  Information on immunization was placed in the AVS.  Annual skin examination to screen for skin cancer was advised.  Use of sunscreen was advised.  History of iritis-patient had noticed more redness in her eyes.  Mild conjunctival injection was noted.  I advised her to schedule an appointment with her ophthalmologist.  Raynaud's disease without gangrene-she continues to have Raynaud's symptoms.  No nailbed capillary changes or sclerodactyly was noted.  Keeping the core temperature warm and warm clothing was advised.  She is avoiding to walk due to cold temperatures.  Use of hand warmers was also advised.  Chronic left SI joint pain-resolved.  Spondylosis of lumbar region without myelopathy or radiculopathy - MRI of lumbar spine on 03/07/20 revealed L4-L5 broad based disc bulge and L5-S1 mild broad-based bulge with small left foraminal disc protrusion.  Patient complains of increased pain and discomfort in her lumbar spine.  The pain is in the left paravertebral region and also has occasional radiculopathy.  I will refer her to physical therapy.  Patient had good response to physical therapy in the past.  Age-related osteoporosis without current pathological fracture- DEXA updated on 02/25/21: [facility] spine BMD 0.771 with T score -2.5. DEXA 05/16/2018 [facility] spine BMD 0.799 with T score -2.3.  DEXA scan from 2022 showed osteoporosis.  Patient has not been walking much and not taking calcium and vitamin D.  Detailed counsel regarding osteoporosis was  provided.  Different treatment options were discussed.  I advised her to get repeat DEXA scan in May or June 2024 through her GYN.  She  will review information regarding Fosamax and make a decision at the follow-up visit.  I will also obtain labs in March.  Fibromyalgia-she continues to have some generalized pain and discomfort from abnormality.  Need for regular exercise was emphasized.  History of hypertension-blood pressure was elevated at 142/93 today.  Repeat blood pressure was 128/81.  She was advised to monitor blood pressure closely and follow-up with her PCP.  Other medical problems are listed as follows:  Dyslipidemia  History of macular degeneration  Orders: Orders Placed This Encounter  Procedures   Parathyroid hormone, intact (no Ca)   VITAMIN D 25 Hydroxy (Vit-D Deficiency, Fractures)   TSH   Serum protein electrophoresis with reflex   CBC with Differential/Platelet   COMPLETE METABOLIC PANEL WITH GFR   No orders of the defined types were placed in this encounter.    Follow-Up Instructions: Return in about 5 months (around 04/29/2023) for inflammatory arthritis.   Pollyann Savoy, MD  Note - This record has been created using Animal nutritionist.  Chart creation errors have been sought, but may not always  have been located. Such creation errors do not reflect on  the standard of medical care.

## 2022-11-29 ENCOUNTER — Encounter: Payer: Self-pay | Admitting: Rheumatology

## 2022-11-29 ENCOUNTER — Ambulatory Visit: Payer: Medicare PPO | Attending: Rheumatology | Admitting: Rheumatology

## 2022-11-29 VITALS — BP 128/81 | HR 73 | Resp 14 | Ht 64.0 in | Wt 133.0 lb

## 2022-11-29 DIAGNOSIS — M810 Age-related osteoporosis without current pathological fracture: Secondary | ICD-10-CM

## 2022-11-29 DIAGNOSIS — E785 Hyperlipidemia, unspecified: Secondary | ICD-10-CM

## 2022-11-29 DIAGNOSIS — Z79899 Other long term (current) drug therapy: Secondary | ICD-10-CM

## 2022-11-29 DIAGNOSIS — M1990 Unspecified osteoarthritis, unspecified site: Secondary | ICD-10-CM

## 2022-11-29 DIAGNOSIS — M8589 Other specified disorders of bone density and structure, multiple sites: Secondary | ICD-10-CM

## 2022-11-29 DIAGNOSIS — I7300 Raynaud's syndrome without gangrene: Secondary | ICD-10-CM

## 2022-11-29 DIAGNOSIS — Z8669 Personal history of other diseases of the nervous system and sense organs: Secondary | ICD-10-CM | POA: Diagnosis not present

## 2022-11-29 DIAGNOSIS — Z8679 Personal history of other diseases of the circulatory system: Secondary | ICD-10-CM

## 2022-11-29 DIAGNOSIS — M47816 Spondylosis without myelopathy or radiculopathy, lumbar region: Secondary | ICD-10-CM

## 2022-11-29 DIAGNOSIS — G8929 Other chronic pain: Secondary | ICD-10-CM

## 2022-11-29 DIAGNOSIS — M533 Sacrococcygeal disorders, not elsewhere classified: Secondary | ICD-10-CM

## 2022-11-29 DIAGNOSIS — M797 Fibromyalgia: Secondary | ICD-10-CM

## 2022-11-29 NOTE — Patient Instructions (Addendum)
Standing Labs We placed an order today for your standing lab work.   Please have your standing labs drawn in March and every 3 months  Please have your labs drawn 2 weeks prior to your appointment so that the provider can discuss your lab results at your appointment.  Please note that you may see your imaging and lab results in [facility] before we have reviewed them. We will contact you once all results are reviewed. Please allow our office up to 72 hours to thoroughly review all of the results before contacting the office for clarification of your results.  Lab hours are:   Monday through Thursday from 8:00 am -12:30 pm and 1:00 pm-5:00 pm and Friday from 8:00 am-12:00 pm.  Please be advised, all patients with office appointments requiring lab work will take precedent over walk-in lab work.   Labs are drawn by [facility]. Please bring your co-pay at the time of your lab draw.  You may receive a bill from [facility] for your lab work.  Please note if you are on Hydroxychloroquine and and an order has been placed for a Hydroxychloroquine level, you will need to have it drawn 4 hours or more after your last dose.  If you wish to have your labs drawn at another location, please call the office 24 hours in advance so we can fax the orders.  The office is located at [address] [place], [place], [place] [address] No appointment is necessary.    If you have any questions regarding directions or hours of operation,  please call [phone].   As a reminder, please drink plenty of water prior to coming for your lab work. Thanks!   If you have signs or symptoms of an infection or start antibiotics: First, call your PCP for workup of your infection. Hold your medication through the infection, until you complete your antibiotics, and until symptoms resolve if you take the following: Injectable medication (Actemra, Benlysta, Cimzia, Cosentyx, Enbrel, Humira, Kevzara, Orencia, Remicade, Simponi,  Stelara, Taltz, Tremfya) Methotrexate Leflunomide (Arava) Mycophenolate (Cellcept) Morrie Sheldon, Olumiant, or Rinvoq  If you have signs or symptoms of an infection or start antibiotics: First, call your PCP for workup of your infection. Hold your medication through the infection, until you complete your antibiotics, and until symptoms resolve if you take the following: Injectable medication (Actemra, Benlysta, Cimzia, Cosentyx, Enbrel, Humira, Kevzara, Orencia, Remicade, Simponi, Stelara, Taltz, Tremfya) Methotrexate Leflunomide (Arava) Mycophenolate (Cellcept) Morrie Sheldon, Olumiant, or Rinvoq   Please get an annual skin exam to screen for skin cancer while you are on Humira  Osteoporosis  Osteoporosis is when the bones get thin and weak. This can cause your bones to break (fracture) more easily. What are the causes? The exact cause of this condition is not known. What increases the risk? Having family members with this condition. Not eating enough healthy foods. Taking certain medicines. Being female. Being age 3 or older. Smoking or using other products that contain nicotine or tobacco, such as e-cigarettes or chewing tobacco. Not exercising. Being of European or Asian ancestry. Having a small body frame. What are the signs or symptoms? A broken bone might be the first sign, especially if the break results from a fall or injury that usually would not cause a bone to break. Other signs and symptoms include: Pain in the neck or low back. Being hunched over (stooped posture). Getting shorter. How is this treated? Eating more foods with more calcium and vitamin D in them. Doing exercises. Stopping  tobacco use. Limiting how much alcohol you drink. Taking medicines to slow bone loss or help make the bones stronger. Taking supplements of calcium and vitamin D every day. Taking medicines to replace chemicals in the body (hormone replacement medicines). Monitoring your levels of calcium  and vitamin D. The goal of treatment is to strengthen your bones and lower your risk for a bone break. Follow these instructions at home: Eating and drinking Eat plenty of calcium and vitamin D. These nutrients are good for your bones. Good sources of calcium and vitamin D include: Some fish, such as salmon and tuna. Foods that have calcium and vitamin D added to them (fortified foods), such as some breakfast cereals. Egg yolks. Cheese. Liver.  Activity Do exercises as told by your doctor. Ask your doctor what exercises are safe for you. You should do: Exercises that make your muscles work to hold your body weight up (weight-bearing exercises). These include tai chi, yoga, and walking. Exercises to make your muscles stronger. One example is lifting weights. Lifestyle Do not drink alcohol if: Your doctor tells you not to drink. You are pregnant, may be pregnant, or are planning to become pregnant. If you drink alcohol: Limit how much you use to: 0-1 drink a day for women. 0-2 drinks a day for men. Know how much alcohol is in your drink. In the [place] one drink equals one 12 oz bottle of beer (355 mL), one 5 oz glass of wine (148 mL), or one 1 oz glass of hard liquor (44 mL). Do not smoke or use any products that contain nicotine or tobacco. If you need help quitting, ask your doctor. Preventing falls Use tools to help you move around (mobility aids) as needed. These include canes, walkers, scooters, and crutches. Keep rooms well-lit. Put away things on the floor that could make you trip. These include cords and rugs. Install safety rails on stairs. Install grab bars in bathrooms. Use rubber mats in slippery areas, like bathrooms. Wear shoes that: Fit you well. Support your feet. Have closed toes. Have rubber soles or low heels. Tell your doctor about all of the medicines you are taking. Some medicines can make you more likely to fall. General instructions Take over-the-counter  and prescription medicines only as told by your doctor. Keep all follow-up visits. Contact a doctor if: You have not been tested (screened) for osteoporosis and you are: A woman who is age 63 or older. A man who is age 74 or older. Get help right away if: You fall. You get hurt. Summary Osteoporosis happens when your bones get thin and weak. Weak bones can break (fracture) more easily. Eat plenty of calcium and vitamin D. These are good for your bones. Tell your doctor about all of the medicines that you take. This information is not intended to replace advice given to you by your health care provider. Make sure you discuss any questions you have with your health care provider. Document Revised: 03/13/2020 Document Reviewed: 03/13/2020 Elsevier Patient Education  [place].  Alendronate Tablets What is this medication? ALENDRONATE (a LEN droe nate) prevents and treats osteoporosis. It may also be used to treat Paget disease of the bone. It works by Paramedic stronger and less likely to break (fracture). It belongs to a group of medications called bisphosphonates. This medicine may be used for other purposes; ask your health care provider or pharmacist if you have questions. COMMON BRAND NAME(S): Fosamax What should I tell my care  team before I take this medication? They need to know if you have any of these conditions: Bleeding disorder Cancer Dental disease Difficulty swallowing Infection (fever, chills, cough, sore throat, pain or trouble passing urine) Kidney disease Low levels of calcium or other minerals in the blood Low red blood cell counts Receiving steroids like dexamethasone or prednisone Stomach or intestine problems Trouble sitting or standing for 30 minutes An unusual or allergic reaction to alendronate, other medications, foods, dyes or preservatives Pregnant or trying to get pregnant Breast-feeding How should I use this medication? Take this  medication by mouth with a full glass of water. Take it as directed on the prescription label at the same time every day. Take the dose right after waking up. Do not eat or drink anything before taking it. Do not take it with any other drink except water. Do not chew or crush the tablet. After taking it, do not eat breakfast, drink, or take any other medications or vitamins for at least 30 minutes. Sit or stand up for at least 30 minutes after you take it. Do not lie down. Keep taking it unless your care team tells you to stop. A special MedGuide will be given to you by the pharmacist with each prescription and refill. Be sure to read this information carefully each time. Talk to your care team about the use of this medication in children. Special care may be needed. Overdosage: If you think you have taken too much of this medicine contact a poison control center or emergency room at once. NOTE: This medicine is only for you. Do not share this medicine with others. What if I miss a dose? If you take your medication once a day, skip it. Take your next dose at the scheduled time the next morning. Do not take two doses on the same day. If you take your medication once a week, take the missed dose on the morning after you remember. Do not take two doses on the same day. What may interact with this medication? Aluminum hydroxide Antacids Aspirin Calcium supplements Medications for inflammation like ibuprofen, naproxen, and others Iron supplements Magnesium supplements Vitamins with minerals This list may not describe all possible interactions. Give your health care provider a list of all the medicines, herbs, non-prescription drugs, or dietary supplements you use. Also tell them if you smoke, drink alcohol, or use illegal drugs. Some items may interact with your medicine. What should I watch for while using this medication? Visit your care team for regular checks on your progress. It may be some time  before you see the benefit from this medication. Some people who take this medication have severe bone, joint, or muscle pain. This medication may also increase your risk for jaw problems or a broken thigh bone. Tell your care team right away if you have severe pain in your jaw, bones, joints, or muscles. Tell you care team if you have any pain that does not go away or that gets worse. Tell your dentist and dental surgeon that you are taking this medication. You should not have major dental surgery while on this medication. See your dentist to have a dental exam and fix any dental problems before starting this medication. Take good care of your teeth while on this medication. Make sure you see your dentist for regular follow-up appointments. You should make sure you get enough calcium and vitamin D while you are taking this medication. Discuss the foods you eat and the vitamins you  take with your care team. You may need blood work done while you are taking this medication. What side effects may I notice from receiving this medication? Side effects that you should report to your care team as soon as possible: Allergic reactions--skin rash, itching, hives, swelling of the face, lips, tongue, or throat Low calcium level--muscle pain or cramps, confusion, tingling, or numbness in the hands or feet Osteonecrosis of the jaw--pain, swelling, or redness in the mouth, numbness of the jaw, poor healing after dental work, unusual discharge from the mouth, visible bones in the mouth Pain or trouble swallowing Severe bone, joint, or muscle pain Stomach bleeding--bloody or black, tar-like stools, vomiting blood or brown material that looks like coffee grounds Side effects that usually do not require medical attention (report to your care team if they continue or are bothersome): Constipation Diarrhea Nausea Stomach pain This list may not describe all possible side effects. Call your doctor for medical advice  about side effects. You may report side effects to FDA at 1-800-FDA-1088. Where should I keep my medication? Keep out of the reach of children and pets. Store at room temperature between 15 and 30 degrees C (59 and 86 degrees F). Throw away any unused medication after the expiration date. NOTE: This sheet is a summary. It may not cover all possible information. If you have questions about this medicine, talk to your doctor, pharmacist, or health care provider.  2023 Elsevier/Gold Standard (2020-09-22 00:00:00)

## 2022-11-29 NOTE — Addendum Note (Signed)
Addended by: Shona Needles on: 11/29/2022 09:45 AM   Modules accepted: Orders

## 2022-12-20 ENCOUNTER — Other Ambulatory Visit: Payer: Self-pay | Admitting: Rheumatology

## 2022-12-20 NOTE — Telephone Encounter (Signed)
Next Visit: 03/24/2023  Last Visit: 11/29/2022  Last Fill: 09/16/2022  [id number] arthritis   Current Dose per office note 11/29/2022: Humira 40 mg every 14 days   Labs: 09/28/2022 CBC and CMP WNL   TB Gold: 06/03/2022  TB Gold is negative.   Okay to refill Humira?

## 2023-01-10 ENCOUNTER — Telehealth: Payer: Self-pay

## 2023-01-10 ENCOUNTER — Other Ambulatory Visit: Payer: Self-pay

## 2023-01-10 DIAGNOSIS — M810 Age-related osteoporosis without current pathological fracture: Secondary | ICD-10-CM

## 2023-01-10 DIAGNOSIS — Z79899 Other long term (current) drug therapy: Secondary | ICD-10-CM

## 2023-01-10 NOTE — Telephone Encounter (Signed)
Patient contacted the office and would like her labs to be released to [facility]. Inquired when the patient is planning to get the labs drawn. Patient states se plans to get her labs drawn sometime this week. Advised patient to call the office either the day she plans to have the labs drawn or the day before to have the labs released to [facility]. Patient verbalized understanding.

## 2023-01-10 NOTE — Telephone Encounter (Signed)
Patient contacted the office and states she has an appointment set for tomorrow 01/11/2023 to get labs drawn. Patient states she would like the labs released to [facility] patient labs were released to [facility].

## 2023-01-14 LAB — CBC WITH DIFFERENTIAL/PLATELET
Absolute Monocytes: 451 cells/uL (ref 200–950)
Basophils Absolute: 10 cells/uL (ref 0–200)
Basophils Relative: 0.2 %
Eosinophils Absolute: 59 cells/uL (ref 15–500)
Eosinophils Relative: 1.2 %
HCT: 43.2 % (ref 35.0–45.0)
Hemoglobin: 14 g/dL (ref 11.7–15.5)
Lymphs Abs: 1719.9 cells/uL (ref 850–3900)
MCH: 29.5 pg (ref 27.0–33.0)
MCHC: 32.4 g/dL (ref 32.0–36.0)
MCV: 90.9 fL (ref 80.0–100.0)
MPV: 8.9 fL (ref 7.5–12.5)
Monocytes Relative: 9.2 %
Neutro Abs: 2661 cells/uL (ref 1500–7800)
Neutrophils Relative %: 54.3 %
Platelets: 335 10*3/uL (ref 140–400)
RBC: 4.75 10*6/uL (ref 3.80–5.10)
RDW: 12.2 % (ref 11.0–15.0)
Total Lymphocyte: 35.1 %
WBC: 4.9 10*3/uL (ref 3.8–10.8)

## 2023-01-14 LAB — COMPLETE METABOLIC PANEL WITH GFR
AG Ratio: 1.6 (calc) (ref 1.0–2.5)
ALT: 16 U/L (ref 6–29)
AST: 20 U/L (ref 10–35)
Albumin: 4.5 g/dL (ref 3.6–5.1)
Alkaline phosphatase (APISO): 82 U/L (ref 37–153)
BUN: 14 mg/dL (ref 7–25)
CO2: 28 mmol/L (ref 20–32)
Calcium: 9.7 mg/dL (ref 8.6–10.4)
Chloride: 101 mmol/L (ref 98–110)
Creat: 0.81 mg/dL (ref 0.50–1.05)
Globulin: 2.9 g/dL (calc) (ref 1.9–3.7)
Glucose, Bld: 91 mg/dL (ref 65–139)
Potassium: 4.1 mmol/L (ref 3.5–5.3)
Sodium: 138 mmol/L (ref 135–146)
Total Bilirubin: 0.4 mg/dL (ref 0.2–1.2)
Total Protein: 7.4 g/dL (ref 6.1–8.1)
eGFR: 80 mL/min/{1.73_m2} (ref >=60)

## 2023-01-14 LAB — PROTEIN ELECTROPHORESIS, SERUM, WITH REFLEX
Albumin ELP: 4.4 g/dL (ref 3.8–4.8)
Alpha 1: 0.3 g/dL (ref 0.2–0.3)
Alpha 2: 0.7 g/dL (ref 0.5–0.9)
Beta 2: 0.3 g/dL (ref 0.2–0.5)
Beta Globulin: 0.4 g/dL (ref 0.4–0.6)
Gamma Globulin: 1.1 g/dL (ref 0.8–1.7)
Total Protein: 7.2 g/dL (ref 6.1–8.1)

## 2023-01-14 LAB — VITAMIN D 25 HYDROXY (VIT D DEFICIENCY, FRACTURES): Vit D, 25-Hydroxy: 45 ng/mL (ref 30–100)

## 2023-01-14 LAB — PARATHYROID HORMONE, INTACT (NO CA): PTH: 32 pg/mL (ref 16–77)

## 2023-01-14 LAB — TSH: TSH: 3.68 mIU/L (ref 0.40–4.50)

## 2023-01-14 NOTE — Progress Notes (Signed)
CBC, CMP, TSH, vitamin D, PTH, SPEP normal

## 2023-01-28 ENCOUNTER — Ambulatory Visit (HOSPITAL_COMMUNITY): Payer: Medicare PPO

## 2023-01-31 ENCOUNTER — Other Ambulatory Visit: Payer: Self-pay | Admitting: Physician Assistant

## 2023-01-31 NOTE — Telephone Encounter (Signed)
Last Fill: 08/09/2022  Labs: 01/10/2022 CBC, CMP, TSH, vitamin D, PTH, SPEP normal   Next Visit: 03/24/2023  Last Visit: 11/29/2022  DX: Inflammatory arthritis   Current Dose per office note 11/29/2022: sulfasalazine 500 mg 2 tablets twice daily   Okay to refill Sulfasalazine?

## 2023-02-09 NOTE — Therapy (Signed)
OUTPATIENT PHYSICAL THERAPY THORACOLUMBAR EVALUATION   Patient Name: Monica [name] MRN: [id number] DOB:Jul 06, 1956, 67 y.o., female Today's Date: 02/14/2023  END OF SESSION:  PT End of Session - 02/14/23 0900     Visit Number 1    Number of Visits 8    Date for PT Re-Evaluation 03/14/23    Authorization Type [employer] Medicare Choice    Authorization Time Period requested auth 5/6 please check    PT Start Time 0900    PT Stop Time 0940    PT Time Calculation (min) 40 min    Activity Tolerance Patient tolerated treatment well    Behavior During Therapy [facility] for tasks assessed/performed             Past Medical History:  Diagnosis Date   Arthritis    Fibromyalgia    Macular degeneration    Left eye   Osteopenia 05/2018   T score -2.3 FRAX 11% / 1.8% overall stable from prior DEXA   RA (rheumatoid arthritis) (HCC)    Raynaud disease    Reiters syndrome    Sleep disturbance    Vaginal dryness    Past Surgical History:  Procedure Laterality Date   BREAST SURGERY     LEFT BR LUMP-Benign   COLONOSCOPY N/A 08/04/2018   Procedure: COLONOSCOPY;  Surgeon: West Bali, MD;  [facility];  Service: Endoscopy;  Laterality: N/A;  8:30   POLYPECTOMY  08/04/2018   Procedure: POLYPECTOMY;  Surgeon: West Bali, MD;  [facility];  Service: Endoscopy;;   Patient Active Problem List   Diagnosis Date Noted   Special screening for malignant neoplasms, colon    High risk medication use 01/13/2017   History of iritis 01/13/2017   Essential hypertension 01/13/2017   Macular degeneration 01/13/2017   Spondylosis of lumbar region without myelopathy or radiculopathy 01/13/2017   Leg weakness 07/08/2014   Difficulty walking 07/08/2014   Vaginal dryness    Reiter's syndrome (HCC)    Raynaud disease    Fibromyalgia    Inflammatory arthritis    Osteopenia     PCP: Gareth Morgan, MD  REFERRING PROVIDER: Pollyann Savoy, MD  REFERRING DIAG:  M53.3,G89.29 (ICD-10-CM) - Chronic left SI joint pain M47.816 (ICD-10-CM) - Spondylosis of lumbar region without myelopathy or radiculopathy  Rationale for Evaluation and Treatment: Rehabilitation  THERAPY DIAG:  Chronic left SI joint pain - Plan: PT plan of care cert/re-cert  Spondylosis of lumbar region without myelopathy or radiculopathy - Plan: PT plan of care cert/re-cert  ONSET DATE: several years at least back to 2018  SUBJECTIVE:  SUBJECTIVE STATEMENT: Patient reports ongoing pain from her back and down her left leg but now is going past her knee lateral leg and foot and to top of foot; seems to be worse at night  PERTINENT HISTORY:  Has been to therapy before; treated for bulging disc and sciatica Raynauds  PAIN:  Are you having pain? Yes: NPRS scale: 1-8/10 Pain location: low back and down left leg Pain description: "bone on bone"ache, burn and stabbing Aggravating factors: worse at night Relieving factors: Tylenol arthritis  PRECAUTIONS: None  WEIGHT BEARING RESTRICTIONS: No  FALLS:  Has patient fallen in last 6 months? No    OCCUPATION: retired  PLOF: Independent  PATIENT GOALS: get out of pain  NEXT MD VISIT: every 5-6 months (next in June)  OBJECTIVE:   DIAGNOSTIC FINDINGS:    CLINICAL DATA:  Low back pain. Intermittent left-sided sciatica. Severe nocturnal pain.   EXAM: MRI LUMBAR SPINE WITHOUT CONTRAST   TECHNIQUE: Multiplanar, multisequence MR imaging of the lumbar spine was performed. No intravenous contrast was administered.   COMPARISON:  10/26/2016   FINDINGS: Segmentation:  Standard.   Alignment:  Physiologic.   Vertebrae: No fracture, evidence of discitis, or aggressive bone lesion. T2 hyperintense and T1 intermediate signal bone lesion in the  L5 vertebral body unchanged compared with 10/26/2016 with stippled low signal within the lesion likely reflecting an atypical hemangioma.   Conus medullaris and cauda equina: Conus extends to the L1 level. Conus and cauda equina appear normal.   Paraspinal and other soft tissues: No acute paraspinal abnormality. Left parapelvic cysts.   Disc levels:   Disc spaces: Degenerative disease with disc desiccation throughout the lumbar spine with mild disc height loss at L1-2. reactive endplate changes at L5-S1.   T12-L1: No significant disc bulge. No evidence of neural foraminal stenosis. No central canal stenosis.   L1-L2: Mild broad-based disc bulge. No evidence of neural foraminal stenosis. No central canal stenosis.   L2-L3: Mild broad-based disc bulge. No evidence of neural foraminal stenosis. No central canal stenosis.   L3-L4: Mild broad-based disc bulge. No foraminal stenosis. No central canal stenosis.   L4-L5: Broad-based disc bulge with a small right foraminal disc protrusion. Mild right foraminal stenosis. No left foraminal stenosis. No central canal stenosis.   L5-S1: Mild broad-based disc bulge with a small left foraminal disc protrusion. Mild bilateral facet arthropathy. Mild left foraminal stenosis. No right foraminal stenosis. No central canal stenosis.   IMPRESSION: 1. At L4-5 there is a broad-based disc bulge with a small right foraminal disc protrusion. Mild right foraminal stenosis. 2. At L5-S1 there is a mild broad-based disc bulge with a small left foraminal disc protrusion. Mild bilateral facet arthropathy. Mild left foraminal stenosis.     Electronically Signed   By: Elige Ko   On: 03/08/2020 09:47  PATIENT SURVEYS:  FOTO 58    COGNITION: Overall cognitive status: Within functional limits for tasks assessed     SENSATION: Occasional numbness in feet and hands  POSTURE: rounded shoulders, forward head, and weight shift  right  PALPATION: Tender lumbar spine paraspinals; PSIS  LUMBAR ROM:   AROM eval  Flexion 70%  pain low back left and some on right  Extension 60% available felt better with repeated movements  Right lateral flexion   Left lateral flexion   Right rotation   Left rotation    (Blank rows = not tested)  LOWER EXTREMITY ROM:     Active  Right eval Left eval  Hip flexion    Hip extension    Hip abduction    Hip adduction    Hip internal rotation    Hip external rotation    Knee flexion    Knee extension    Ankle dorsiflexion    Ankle plantarflexion    Ankle inversion    Ankle eversion     (Blank rows = not tested)  LOWER EXTREMITY MMT:    MMT Right eval Left eval  Hip flexion 4+ 4-  Hip extension 4+ 4  Hip abduction    Hip adduction    Hip internal rotation    Hip external rotation    Knee flexion 5 4-  Knee extension 5 4  Ankle dorsiflexion 5 4+  Ankle plantarflexion    Ankle inversion    Ankle eversion     (Blank rows = not tested)   FUNCTIONAL TESTS:  5 times sit to stand: 15.46 sec  TODAY'S TREATMENT:                                                                                                                              DATE: 02/14/23 Physical therapy evaluation and HEP instruction    PATIENT EDUCATION:  Education details: Patient educated on exam findings, POC, scope of PT, HEP, and what to expect next visit. Person educated: Patient Education method: Explanation, Demonstration, and Handouts Education comprehension: verbalized understanding, returned demonstration, verbal cues required, and tactile cues required  HOME EXERCISE PROGRAM: Access Code: [email or web address] URL: [email or web address] Date: 02/14/2023 Prepared by: [facility]  Exercises - Prone Gluteal Sets  - 2 x daily - 7 x weekly - 1 sets - 10 reps - 5 sec hold - Supine Transversus Abdominis Bracing - Hands on Stomach  - 1 x daily - 7 x weekly - 1 sets - 10 reps - 5 sec  hold  ASSESSMENT:  CLINICAL IMPRESSION: Patient is a 67 y.o. female who was seen today for physical therapy evaluation and treatment for low back pain and down into left leg. Patient demonstrates muscle weakness, reduced ROM, and fascial restrictions which are likely contributing to symptoms of pain and are negatively impacting patient ability to perform ADLs and functional mobility tasks. Patient will benefit from skilled physical therapy services to address these deficits to reduce pain and improve level of function with ADLs and functional mobility tasks.   OBJECTIVE IMPAIRMENTS: decreased activity tolerance, decreased endurance, decreased knowledge of condition, decreased mobility, difficulty walking, decreased ROM, decreased strength, hypomobility, increased fascial restrictions, impaired perceived functional ability, increased muscle spasms, impaired flexibility, and pain.   ACTIVITY LIMITATIONS: carrying, lifting, bending, sitting, standing, squatting, sleeping, stairs, transfers, locomotion level, and caring for others  PARTICIPATION LIMITATIONS: meal prep, cleaning, laundry, medication management, community activity, and yard work  [facility] POTENTIAL: Good  CLINICAL DECISION MAKING: Stable/uncomplicated  EVALUATION COMPLEXITY: Low   GOALS: Goals reviewed with patient? No  SHORT TERM GOALS: Target date: 02/28/2023  patient will be  independent with initial HEP Baseline: Goal status: INITIAL  2.  Patient will self report 30% improvement to improve tolerance for functional activity   Baseline:  Goal status: INITIAL   LONG TERM GOALS: Target date: 03/14/2023  Patient will be independent in self management strategies to improve quality of life and functional outcomes.   Baseline:  Goal status: INITIAL  2.  Patient will self report 50% improvement to improve tolerance for functional activity   Baseline:  Goal status: INITIAL  3.  Patient will improve FOTO score by 10  points  Baseline: 58 Goal status: INITIAL  4.  Patient will increase left leg MMTs to 4+ to 5/5 without pain to promote return to ambulation community distances with minimal deviation.  Baseline: see above Goal status: INITIAL  5.  Patient will improve 5 times sit to stand score from 15.46 sec sec to 12 sec to demonstrate improved functional mobility and increased lower extremity strength.  Baseline:  Goal status: INITIAL  6.  Patient will stand and walk x 30 min without radiating left leg symptoms past her knee to improve ability to perform yard and house work efficiently Baseline:  Goal status: INITIAL  PLAN:  PT FREQUENCY: 2x/week  PT DURATION: 4 weeks  PLANNED INTERVENTIONS: Therapeutic exercises, Therapeutic activity, Neuromuscular re-education, Balance training, Gait training, Patient/Family education, Joint manipulation, Joint mobilization, Stair training, Orthotic/Fit training, DME instructions, Aquatic Therapy, Dry Needling, Electrical stimulation, Spinal manipulation, Spinal mobilization, Cryotherapy, Moist heat, Compression bandaging, scar mobilization, Splintting, Taping, Traction, Ultrasound, Ionotophoresis 4mg /ml Dexamethasone, and Manual therapy .  PLAN FOR NEXT SESSION: Review HEP and goals; centralize symptoms; core strengthening.   9:51 AM, 02/14/23 Aubryanna Nesheim Small Jo Booze MPT [facility] [place] [phone]

## 2023-02-14 ENCOUNTER — Ambulatory Visit (HOSPITAL_COMMUNITY): Payer: Medicare PPO | Attending: Rheumatology

## 2023-02-14 ENCOUNTER — Other Ambulatory Visit: Payer: Self-pay

## 2023-02-14 DIAGNOSIS — G8929 Other chronic pain: Secondary | ICD-10-CM | POA: Diagnosis present

## 2023-02-14 DIAGNOSIS — M47816 Spondylosis without myelopathy or radiculopathy, lumbar region: Secondary | ICD-10-CM | POA: Diagnosis present

## 2023-02-14 DIAGNOSIS — M533 Sacrococcygeal disorders, not elsewhere classified: Secondary | ICD-10-CM | POA: Diagnosis present

## 2023-02-16 ENCOUNTER — Ambulatory Visit (HOSPITAL_COMMUNITY): Payer: Medicare PPO

## 2023-02-16 DIAGNOSIS — M47816 Spondylosis without myelopathy or radiculopathy, lumbar region: Secondary | ICD-10-CM

## 2023-02-16 DIAGNOSIS — M533 Sacrococcygeal disorders, not elsewhere classified: Secondary | ICD-10-CM | POA: Diagnosis not present

## 2023-02-16 DIAGNOSIS — G8929 Other chronic pain: Secondary | ICD-10-CM

## 2023-02-16 NOTE — Therapy (Signed)
OUTPATIENT PHYSICAL THERAPY THORACOLUMBAR TREATMENT   Patient Name: Monica [name] MRN: [id number] DOB:[birth year], 67 y.o., female Today's Date: 02/16/2023  END OF SESSION:  PT End of Session - 02/16/23 1002     Visit Number 2    Number of Visits 8    Date for PT Re-Evaluation 03/14/23    Authorization Type [employer] Medicare Choice    Authorization Time Period 1 re-eval and 8 visits from 02/14/23 to 03/14/23    Authorization - Visit Number 2    Authorization - Number of Visits 8    Progress Note Due on Visit 8    PT Start Time 1000    PT Stop Time 1040    PT Time Calculation (min) 40 min    Activity Tolerance Patient tolerated treatment well    Behavior During Therapy [facility] for tasks assessed/performed             Past Medical History:  Diagnosis Date   Arthritis    Fibromyalgia    Macular degeneration    Left eye   Osteopenia 05/2018   T score -2.3 FRAX 11% / 1.8% overall stable from prior DEXA   RA (rheumatoid arthritis) (HCC)    Raynaud disease    Reiters syndrome    Sleep disturbance    Vaginal dryness    Past Surgical History:  Procedure Laterality Date   BREAST SURGERY     LEFT BR LUMP-Benign   COLONOSCOPY N/A 08/04/2018   Procedure: COLONOSCOPY;  Surgeon: West Bali, MD;  [facility];  Service: Endoscopy;  Laterality: N/A;  8:30   POLYPECTOMY  08/04/2018   Procedure: POLYPECTOMY;  Surgeon: West Bali, MD;  [facility];  Service: Endoscopy;;   Patient Active Problem List   Diagnosis Date Noted   Special screening for malignant neoplasms, colon    High risk medication use 01/13/2017   History of iritis 01/13/2017   Essential hypertension 01/13/2017   Macular degeneration 01/13/2017   Spondylosis of lumbar region without myelopathy or radiculopathy 01/13/2017   Leg weakness 07/08/2014   Difficulty walking 07/08/2014   Vaginal dryness    Reiter's syndrome (HCC)    Raynaud disease    Fibromyalgia    Inflammatory arthritis     Osteopenia     PCP: Gareth Morgan, MD  REFERRING PROVIDER: Pollyann Savoy, MD  REFERRING DIAG: M53.3,G89.29 (ICD-10-CM) - Chronic left SI joint pain M47.816 (ICD-10-CM) - Spondylosis of lumbar region without myelopathy or radiculopathy  Rationale for Evaluation and Treatment: Rehabilitation  THERAPY DIAG:  Chronic left SI joint pain  Spondylosis of lumbar region without myelopathy or radiculopathy  ONSET DATE: several years at least back to 2018  SUBJECTIVE:  SUBJECTIVE STATEMENT: Bad day yesterday; history of Morton"s neuroma in both feet and that seems to be acting up.  This morning though she is feeling pretty good; 4/10 pain in the low back; some tingling in her left foot  PERTINENT HISTORY:  Has been to therapy before; treated for bulging disc and sciatica Raynauds  PAIN:  Are you having pain? Yes: NPRS scale: 1-8/10 Pain location: low back and down left leg Pain description: "bone on bone"ache, burn and stabbing Aggravating factors: worse at night Relieving factors: Tylenol arthritis  PRECAUTIONS: None  WEIGHT BEARING RESTRICTIONS: No  FALLS:  Has patient fallen in last 6 months? No    OCCUPATION: retired  PLOF: Independent  PATIENT GOALS: get out of pain  NEXT MD VISIT: every 5-6 months (next in June)  OBJECTIVE:   DIAGNOSTIC FINDINGS: from 2021   CLINICAL DATA:  Low back pain. Intermittent left-sided sciatica. Severe nocturnal pain.   EXAM: MRI LUMBAR SPINE WITHOUT CONTRAST   TECHNIQUE: Multiplanar, multisequence MR imaging of the lumbar spine was performed. No intravenous contrast was administered.   COMPARISON:  10/26/2016   FINDINGS: Segmentation:  Standard.   Alignment:  Physiologic.   Vertebrae: No fracture, evidence of discitis, or  aggressive bone lesion. T2 hyperintense and T1 intermediate signal bone lesion in the L5 vertebral body unchanged compared with 10/26/2016 with stippled low signal within the lesion likely reflecting an atypical hemangioma.   Conus medullaris and cauda equina: Conus extends to the L1 level. Conus and cauda equina appear normal.   Paraspinal and other soft tissues: No acute paraspinal abnormality. Left parapelvic cysts.   Disc levels:   Disc spaces: Degenerative disease with disc desiccation throughout the lumbar spine with mild disc height loss at L1-2. reactive endplate changes at L5-S1.   T12-L1: No significant disc bulge. No evidence of neural foraminal stenosis. No central canal stenosis.   L1-L2: Mild broad-based disc bulge. No evidence of neural foraminal stenosis. No central canal stenosis.   L2-L3: Mild broad-based disc bulge. No evidence of neural foraminal stenosis. No central canal stenosis.   L3-L4: Mild broad-based disc bulge. No foraminal stenosis. No central canal stenosis.   L4-L5: Broad-based disc bulge with a small right foraminal disc protrusion. Mild right foraminal stenosis. No left foraminal stenosis. No central canal stenosis.   L5-S1: Mild broad-based disc bulge with a small left foraminal disc protrusion. Mild bilateral facet arthropathy. Mild left foraminal stenosis. No right foraminal stenosis. No central canal stenosis.   IMPRESSION: 1. At L4-5 there is a broad-based disc bulge with a small right foraminal disc protrusion. Mild right foraminal stenosis. 2. At L5-S1 there is a mild broad-based disc bulge with a small left foraminal disc protrusion. Mild bilateral facet arthropathy. Mild left foraminal stenosis.     Electronically Signed   By: Elige Ko   On: 03/08/2020 09:47  PATIENT SURVEYS:  FOTO 58    COGNITION: Overall cognitive status: Within functional limits for tasks assessed     SENSATION: Occasional numbness in feet  and hands  POSTURE: rounded shoulders, forward head, and weight shift right  PALPATION: Tender lumbar spine paraspinals; PSIS  LUMBAR ROM:   AROM eval  Flexion 70%  pain low back left and some on right  Extension 60% available felt better with repeated movements  Right lateral flexion   Left lateral flexion   Right rotation   Left rotation    (Blank rows = not tested)  LOWER EXTREMITY ROM:     Active  Right eval Left eval  Hip flexion    Hip extension    Hip abduction    Hip adduction    Hip internal rotation    Hip external rotation    Knee flexion    Knee extension    Ankle dorsiflexion    Ankle plantarflexion    Ankle inversion    Ankle eversion     (Blank rows = not tested)  LOWER EXTREMITY MMT:    MMT Right eval Left eval  Hip flexion 4+ 4-  Hip extension 4+ 4  Hip abduction    Hip adduction    Hip internal rotation    Hip external rotation    Knee flexion 5 4-  Knee extension 5 4  Ankle dorsiflexion 5 4+  Ankle plantarflexion    Ankle inversion    Ankle eversion     (Blank rows = not tested)   FUNCTIONAL TESTS:  5 times sit to stand: 15.46 sec  TODAY'S TREATMENT:                                                                                                                              DATE:  02/16/23 Review of HEP and goals Prone lying x 1' (increased tingling in left foot so d/c)  Supine: Diaphragmatic breathing x 1' SKTC 20" x 5 each Active hamstring stretch with ankle AROM DF/PF nerve floss 3 x 20" each Abdominal bracing 5" x 10 LTR x 10 DKTC x 5     02/14/23 Physical therapy evaluation and HEP instruction    PATIENT EDUCATION:  Education details: Patient educated on exam findings, POC, scope of PT, HEP, and what to expect next visit. Person educated: Patient Education method: Explanation, Demonstration, and Handouts Education comprehension: verbalized understanding, returned demonstration, verbal cues required, and tactile  cues required  HOME EXERCISE PROGRAM: Access Code: [email or web address] URL: [email or web address] Date: 02/16/2023 Prepared by: [facility]  Exercises - Supine Transversus Abdominis Bracing - Hands on Stomach  - 2 x daily - 7 x weekly - 1 sets - 10 reps - 5 sec hold - Supine Gluteal Sets  - 2 x daily - 7 x weekly - 3 sets - 10 reps - 5 sec hold - Hooklying Single Knee to Chest  - 2 x daily - 7 x weekly - 1 sets - 3 reps - 20 sec hold - Supine Lower Trunk Rotation  - 2 x daily - 7 x weekly - 1 sets - 10 reps - 2" hold - Supine Sciatic Nerve Glide  - 1 x daily - 7 x weekly - 1 sets - 3 reps - 20"  hold  Access Code: [email or web address] URL: [email or web address] Date: 02/14/2023 Prepared by: [facility]  Exercises - Prone Gluteal Sets  - 2 x daily - 7 x weekly - 1 sets - 10 reps - 5 sec hold - Supine Transversus Abdominis Bracing - Hands on Stomach  - 1 x daily -  7 x weekly - 1 sets - 10 reps - 5 sec hold  ASSESSMENT:  CLINICAL IMPRESSION: Today's session started with a review of HEP and goals; patient verbalizes agreement with set rehab goals.  Noted increased in distal symptoms in prone so discontinued and began more flexion based program with core strengthening.  Updated HEP; decreased tingling in the foot with nerve glide and flexion activity today; some tingling noted left knee and upper lateral calf area at the end of treatment.  Patient will benefit from continued skilled therapy services  to address deficits and promote return to optimal function.       Eval:Patient is a 67 y.o. female who was seen today for physical therapy evaluation and treatment for low back pain and down into left leg. Patient demonstrates muscle weakness, reduced ROM, and fascial restrictions which are likely contributing to symptoms of pain and are negatively impacting patient ability to perform ADLs and functional mobility tasks. Patient will benefit from skilled physical therapy services to address  these deficits to reduce pain and improve level of function with ADLs and functional mobility tasks.   OBJECTIVE IMPAIRMENTS: decreased activity tolerance, decreased endurance, decreased knowledge of condition, decreased mobility, difficulty walking, decreased ROM, decreased strength, hypomobility, increased fascial restrictions, impaired perceived functional ability, increased muscle spasms, impaired flexibility, and pain.   ACTIVITY LIMITATIONS: carrying, lifting, bending, sitting, standing, squatting, sleeping, stairs, transfers, locomotion level, and caring for others  PARTICIPATION LIMITATIONS: meal prep, cleaning, laundry, medication management, community activity, and yard work  [facility] POTENTIAL: Good  CLINICAL DECISION MAKING: Stable/uncomplicated  EVALUATION COMPLEXITY: Low   GOALS: Goals reviewed with patient? No  SHORT TERM GOALS: Target date: 02/28/2023  patient will be independent with initial HEP Baseline: Goal status: IN PROGRESS  2.  Patient will self report 30% improvement to improve tolerance for functional activity   Baseline:  Goal status: IN PROGRESS   LONG TERM GOALS: Target date: 03/14/2023  Patient will be independent in self management strategies to improve quality of life and functional outcomes.   Baseline:  Goal status: IN PROGRESS  2.  Patient will self report 50% improvement to improve tolerance for functional activity   Baseline:  Goal status: IN PROGRESS  3.  Patient will improve FOTO score by 10 points  Baseline: 58 Goal status: IN PROGRESS  4.  Patient will increase left leg MMTs to 4+ to 5/5 without pain to promote return to ambulation community distances with minimal deviation.  Baseline: see above Goal status: IN PROGRESS  5.  Patient will improve 5 times sit to stand score from 15.46 sec sec to 12 sec to demonstrate improved functional mobility and increased lower extremity strength.  Baseline:  Goal status: IN  PROGRESS  6.  Patient will stand and walk x 30 min without radiating left leg symptoms past her knee to improve ability to perform yard and house work efficiently Baseline:  Goal status: IN PROGRESS  PLAN:  PT FREQUENCY: 2x/week  PT DURATION: 4 weeks  PLANNED INTERVENTIONS: Therapeutic exercises, Therapeutic activity, Neuromuscular re-education, Balance training, Gait training, Patient/Family education, Joint manipulation, Joint mobilization, Stair training, Orthotic/Fit training, DME instructions, Aquatic Therapy, Dry Needling, Electrical stimulation, Spinal manipulation, Spinal mobilization, Cryotherapy, Moist heat, Compression bandaging, scar mobilization, Splintting, Taping, Traction, Ultrasound, Ionotophoresis 4mg /ml Dexamethasone, and Manual therapy .  PLAN FOR NEXT SESSION:  centralize symptoms; core strengthening.   10:47 AM, 02/16/23 Derryl Uher Small Amariyana Heacox MPT [facility] [place] [phone]

## 2023-02-22 ENCOUNTER — Ambulatory Visit (HOSPITAL_COMMUNITY): Payer: Medicare PPO

## 2023-02-22 ENCOUNTER — Encounter (HOSPITAL_COMMUNITY): Payer: Self-pay

## 2023-02-22 DIAGNOSIS — M47816 Spondylosis without myelopathy or radiculopathy, lumbar region: Secondary | ICD-10-CM

## 2023-02-22 DIAGNOSIS — G8929 Other chronic pain: Secondary | ICD-10-CM

## 2023-02-22 DIAGNOSIS — M533 Sacrococcygeal disorders, not elsewhere classified: Secondary | ICD-10-CM | POA: Diagnosis not present

## 2023-02-22 NOTE — Therapy (Signed)
OUTPATIENT PHYSICAL THERAPY THORACOLUMBAR TREATMENT   Patient Name: Monica [name] MRN: [id number] DOB:[birth year], 67 y.o., female Today's Date: 02/22/2023  END OF SESSION:  PT End of Session - 02/22/23 1135     Visit Number 3    Number of Visits 8    Date for PT Re-Evaluation 03/14/23    Authorization Type [employer] Choice    Authorization Time Period 1 re-eval and 8 visits from 02/14/23 to 03/14/23    Authorization - Visit Number 3    Authorization - Number of Visits 8    Progress Note Due on Visit 8    PT Start Time 1050    PT Stop Time 1130    PT Time Calculation (min) 40 min    Activity Tolerance Patient tolerated treatment well    Behavior During Therapy [facility] for tasks assessed/performed              Past Medical History:  Diagnosis Date   Arthritis    Fibromyalgia    Macular degeneration    Left eye   Osteopenia 05/2018   T score -2.3 FRAX 11% / 1.8% overall stable from prior DEXA   RA (rheumatoid arthritis) (HCC)    Raynaud disease    Reiters syndrome    Sleep disturbance    Vaginal dryness    Past Surgical History:  Procedure Laterality Date   BREAST SURGERY     LEFT BR LUMP-Benign   COLONOSCOPY N/A 08/04/2018   Procedure: COLONOSCOPY;  Surgeon: West Bali, MD;  Location: [facility];  Service: Endoscopy;  Laterality: N/A;  8:30   POLYPECTOMY  08/04/2018   Procedure: POLYPECTOMY;  Surgeon: West Bali, MD;  Location: [facility];  Service: Endoscopy;;   Patient Active Problem List   Diagnosis Date Noted   Special screening for malignant neoplasms, colon    High risk medication use 01/13/2017   History of iritis 01/13/2017   Essential hypertension 01/13/2017   Macular degeneration 01/13/2017   Spondylosis of lumbar region without myelopathy or radiculopathy 01/13/2017   Leg weakness 07/08/2014   Difficulty walking 07/08/2014   Vaginal dryness    Reiter's syndrome (HCC)    Raynaud disease    Fibromyalgia    Inflammatory  arthritis    Osteopenia     PCP: Gareth Morgan, MD  REFERRING PROVIDER: Pollyann Savoy, MD  REFERRING DIAG: M53.3,G89.29 (ICD-10-CM) - Chronic left SI joint pain M47.816 (ICD-10-CM) - Spondylosis of lumbar region without myelopathy or radiculopathy  Rationale for Evaluation and Treatment: Rehabilitation  THERAPY DIAG:  Chronic left SI joint pain  Spondylosis of lumbar region without myelopathy or radiculopathy  ONSET DATE: several years at least back to 2018  SUBJECTIVE:  SUBJECTIVE STATEMENT: Pt stated she had a good night sleep and minimal pain today.  Does have some tingling Lt LE from knee and wraps around to her arch   PERTINENT HISTORY:  Has been to therapy before; treated for bulging disc and sciatica Raynauds  PAIN:  Are you having pain? Yes: NPRS scale: 1-8/10 Pain location: low back and down left leg Pain description: "bone on bone"ache, burn and stabbing Aggravating factors: worse at night Relieving factors: Tylenol arthritis  PRECAUTIONS: None  WEIGHT BEARING RESTRICTIONS: No  FALLS:  Has patient fallen in last 6 months? No    OCCUPATION: retired  PLOF: Independent  PATIENT GOALS: get out of pain  NEXT MD VISIT: every 5-6 months (next in June)  OBJECTIVE:   DIAGNOSTIC FINDINGS: from 2021   CLINICAL DATA:  Low back pain. Intermittent left-sided sciatica. Severe nocturnal pain.   EXAM: MRI LUMBAR SPINE WITHOUT CONTRAST   TECHNIQUE: Multiplanar, multisequence MR imaging of the lumbar spine was performed. No intravenous contrast was administered.   COMPARISON:  10/26/2016   FINDINGS: Segmentation:  Standard.   Alignment:  Physiologic.   Vertebrae: No fracture, evidence of discitis, or aggressive bone lesion. T2 hyperintense and T1 intermediate  signal bone lesion in the L5 vertebral body unchanged compared with 10/26/2016 with stippled low signal within the lesion likely reflecting an atypical hemangioma.   Conus medullaris and cauda equina: Conus extends to the L1 level. Conus and cauda equina appear normal.   Paraspinal and other soft tissues: No acute paraspinal abnormality. Left parapelvic cysts.   Disc levels:   Disc spaces: Degenerative disease with disc desiccation throughout the lumbar spine with mild disc height loss at L1-2. reactive endplate changes at L5-S1.   T12-L1: No significant disc bulge. No evidence of neural foraminal stenosis. No central canal stenosis.   L1-L2: Mild broad-based disc bulge. No evidence of neural foraminal stenosis. No central canal stenosis.   L2-L3: Mild broad-based disc bulge. No evidence of neural foraminal stenosis. No central canal stenosis.   L3-L4: Mild broad-based disc bulge. No foraminal stenosis. No central canal stenosis.   L4-L5: Broad-based disc bulge with a small right foraminal disc protrusion. Mild right foraminal stenosis. No left foraminal stenosis. No central canal stenosis.   L5-S1: Mild broad-based disc bulge with a small left foraminal disc protrusion. Mild bilateral facet arthropathy. Mild left foraminal stenosis. No right foraminal stenosis. No central canal stenosis.   IMPRESSION: 1. At L4-5 there is a broad-based disc bulge with a small right foraminal disc protrusion. Mild right foraminal stenosis. 2. At L5-S1 there is a mild broad-based disc bulge with a small left foraminal disc protrusion. Mild bilateral facet arthropathy. Mild left foraminal stenosis.     Electronically Signed   By: Elige Ko   On: 03/08/2020 09:47  PATIENT SURVEYS:  FOTO 58    COGNITION: Overall cognitive status: Within functional limits for tasks assessed     SENSATION: Occasional numbness in feet and hands  POSTURE: rounded shoulders, forward head, and  weight shift right  PALPATION: Tender lumbar spine paraspinals; PSIS  LUMBAR ROM:   AROM eval  Flexion 70%  pain low back left and some on right  Extension 60% available felt better with repeated movements  Right lateral flexion   Left lateral flexion   Right rotation   Left rotation    (Blank rows = not tested)  LOWER EXTREMITY ROM:     Active  Right eval Left eval  Hip flexion  Hip extension    Hip abduction    Hip adduction    Hip internal rotation    Hip external rotation    Knee flexion    Knee extension    Ankle dorsiflexion    Ankle plantarflexion    Ankle inversion    Ankle eversion     (Blank rows = not tested)  LOWER EXTREMITY MMT:    MMT Right eval Left eval  Hip flexion 4+ 4-  Hip extension 4+ 4  Hip abduction    Hip adduction    Hip internal rotation    Hip external rotation    Knee flexion 5 4-  Knee extension 5 4  Ankle dorsiflexion 5 4+  Ankle plantarflexion    Ankle inversion    Ankle eversion     (Blank rows = not tested)   FUNCTIONAL TESTS:  5 times sit to stand: 15.46 sec  TODAY'S TREATMENT:                                                                                                                              DATE:  02/22/23 Supine:  Abdominal bracing 5" x 10 Pubic clearing Hooklying isometric add against ball then abd in belt 10x 5" holds Bridge 10x while pushing thighs into belt Active hamstring stretch with ankle AROM DF/PF nerve floss 3 x 20" each Checked SI alignment Marching with ab set 10x 5" SKTC 3x 30"  02/16/23 Review of HEP and goals Prone lying x 1' (increased tingling in left foot so d/c)  Supine: Diaphragmatic breathing x 1' SKTC 20" x 5 each Active hamstring stretch with ankle AROM DF/PF nerve floss 3 x 20" each Abdominal bracing 5" x 10 LTR x 10 DKTC x 5     02/14/23 Physical therapy evaluation and HEP instruction    PATIENT EDUCATION:  Education details: Patient educated on exam findings,  POC, scope of PT, HEP, and what to expect next visit. Person educated: Patient Education method: Explanation, Demonstration, and Handouts Education comprehension: verbalized understanding, returned demonstration, verbal cues required, and tactile cues required  HOME EXERCISE PROGRAM: Access Code: [email or web address] URL: [email or web address] 02/22/23 - Hooklying Isometric Hip Abduction Adduction with Belt and Ball  - 1 x daily - 7 x weekly - 3 sets - 10 reps - 5" hold - Supine Bridge  - 1 x daily - 7 x weekly - 3 sets - 10 reps - 5" hold Date: 02/16/2023 Prepared by: [facility]  Exercises - Supine Transversus Abdominis Bracing - Hands on Stomach  - 2 x daily - 7 x weekly - 1 sets - 10 reps - 5 sec hold - Supine Gluteal Sets  - 2 x daily - 7 x weekly - 3 sets - 10 reps - 5 sec hold - Hooklying Single Knee to Chest  - 2 x daily - 7 x weekly - 1 sets - 3 reps - 20 sec hold - Supine Lower Trunk Rotation  -  2 x daily - 7 x weekly - 1 sets - 10 reps - 2" hold - Supine Sciatic Nerve Glide  - 1 x daily - 7 x weekly - 1 sets - 3 reps - 20"  hold  Access Code: [email or web address] URL: [email or web address] Date: 02/14/2023 Prepared by: [facility]  Exercises - Prone Gluteal Sets  - 2 x daily - 7 x weekly - 1 sets - 10 reps - 5 sec hold - Supine Transversus Abdominis Bracing - Hands on Stomach  - 1 x daily - 7 x weekly - 1 sets - 10 reps - 5 sec hold  ASSESSMENT:  CLINICAL IMPRESSION: Pt with pain over Lt PSIS and radicular symptoms.  Therapist checked SI alignment and added pubic clearing exercises for core strengthening and SI alignment.  Continued with nerve gliding and flexion based exercises.  EOS pt reports more centralization with radicular symptoms ending mid shin.  Eval:Patient is a 67 y.o. female who was seen today for physical therapy evaluation and treatment for low back pain and down into left leg. Patient demonstrates muscle weakness, reduced ROM, and fascial restrictions  which are likely contributing to symptoms of pain and are negatively impacting patient ability to perform ADLs and functional mobility tasks. Patient will benefit from skilled physical therapy services to address these deficits to reduce pain and improve level of function with ADLs and functional mobility tasks.   OBJECTIVE IMPAIRMENTS: decreased activity tolerance, decreased endurance, decreased knowledge of condition, decreased mobility, difficulty walking, decreased ROM, decreased strength, hypomobility, increased fascial restrictions, impaired perceived functional ability, increased muscle spasms, impaired flexibility, and pain.   ACTIVITY LIMITATIONS: carrying, lifting, bending, sitting, standing, squatting, sleeping, stairs, transfers, locomotion level, and caring for others  PARTICIPATION LIMITATIONS: meal prep, cleaning, laundry, medication management, community activity, and yard work  [facility] POTENTIAL: Good  CLINICAL DECISION MAKING: Stable/uncomplicated  EVALUATION COMPLEXITY: Low   GOALS: Goals reviewed with patient? No  SHORT TERM GOALS: Target date: 02/28/2023  patient will be independent with initial HEP Baseline: Goal status: IN PROGRESS  2.  Patient will self report 30% improvement to improve tolerance for functional activity   Baseline:  Goal status: IN PROGRESS   LONG TERM GOALS: Target date: 03/14/2023  Patient will be independent in self management strategies to improve quality of life and functional outcomes.   Baseline:  Goal status: IN PROGRESS  2.  Patient will self report 50% improvement to improve tolerance for functional activity   Baseline:  Goal status: IN PROGRESS  3.  Patient will improve FOTO score by 10 points  Baseline: 58 Goal status: IN PROGRESS  4.  Patient will increase left leg MMTs to 4+ to 5/5 without pain to promote return to ambulation community distances with minimal deviation.  Baseline: see above Goal status: IN  PROGRESS  5.  Patient will improve 5 times sit to stand score from 15.46 sec sec to 12 sec to demonstrate improved functional mobility and increased lower extremity strength.  Baseline:  Goal status: IN PROGRESS  6.  Patient will stand and walk x 30 min without radiating left leg symptoms past her knee to improve ability to perform yard and house work efficiently Baseline:  Goal status: IN PROGRESS  PLAN:  PT FREQUENCY: 2x/week  PT DURATION: 4 weeks  PLANNED INTERVENTIONS: Therapeutic exercises, Therapeutic activity, Neuromuscular re-education, Balance training, Gait training, Patient/Family education, Joint manipulation, Joint mobilization, Stair training, Orthotic/Fit training, DME instructions, Aquatic Therapy, Dry Needling, Electrical stimulation, Spinal manipulation, Spinal mobilization, Cryotherapy, Moist  heat, Compression bandaging, scar mobilization, Splintting, Taping, Traction, Ultrasound, Ionotophoresis 4mg /ml Dexamethasone, and Manual therapy .  PLAN FOR NEXT SESSION:  centralize symptoms; core strengthening.  Becky Sax, LPTA/CLT; CBIS [phone]  12:46 PM, 02/22/23

## 2023-02-24 ENCOUNTER — Ambulatory Visit (HOSPITAL_COMMUNITY): Payer: Medicare PPO

## 2023-02-24 DIAGNOSIS — M533 Sacrococcygeal disorders, not elsewhere classified: Secondary | ICD-10-CM | POA: Diagnosis not present

## 2023-02-24 DIAGNOSIS — M47816 Spondylosis without myelopathy or radiculopathy, lumbar region: Secondary | ICD-10-CM

## 2023-02-24 DIAGNOSIS — G8929 Other chronic pain: Secondary | ICD-10-CM

## 2023-02-24 NOTE — Therapy (Signed)
OUTPATIENT PHYSICAL THERAPY THORACOLUMBAR TREATMENT   Patient Name: Monica [name] MRN: [id number] DOB:06-Dec-1955, 67 y.o., female Today's Date: 02/24/2023  END OF SESSION:  PT End of Session - 02/24/23 0949     Visit Number 4    Number of Visits 8    Date for PT Re-Evaluation 03/14/23    Authorization Type [employer]    Authorization Time Period 1 re-eval and 8 visits from 02/14/23 to 03/14/23    Authorization - Visit Number 4    Authorization - Number of Visits 8    Progress Note Due on Visit 8    PT Start Time 0949    PT Stop Time 1028    PT Time Calculation (min) 39 min    Activity Tolerance Patient tolerated treatment well    Behavior During Therapy [facility] for tasks assessed/performed              Past Medical History:  Diagnosis Date   Arthritis    Fibromyalgia    Macular degeneration    Left eye   Osteopenia 05/2018   T score -2.3 FRAX 11% / 1.8% overall stable from prior DEXA   RA (rheumatoid arthritis) (HCC)    Raynaud disease    Reiters syndrome    Sleep disturbance    Vaginal dryness    Past Surgical History:  Procedure Laterality Date   BREAST SURGERY     LEFT BR LUMP-Benign   COLONOSCOPY N/A 08/04/2018   Procedure: COLONOSCOPY;  Surgeon: West Bali, MD;  Location: [facility];  Service: Endoscopy;  Laterality: N/A;  8:30   POLYPECTOMY  08/04/2018   Procedure: POLYPECTOMY;  Surgeon: West Bali, MD;  Location: [facility];  Service: Endoscopy;;   Patient Active Problem List   Diagnosis Date Noted   Special screening for malignant neoplasms, colon    High risk medication use 01/13/2017   History of iritis 01/13/2017   Essential hypertension 01/13/2017   Macular degeneration 01/13/2017   Spondylosis of lumbar region without myelopathy or radiculopathy 01/13/2017   Leg weakness 07/08/2014   Difficulty walking 07/08/2014   Vaginal dryness    Reiter's syndrome (HCC)    Raynaud disease    Fibromyalgia    Inflammatory  arthritis    Osteopenia     PCP: Gareth Morgan, MD  REFERRING PROVIDER: Pollyann Savoy, MD  REFERRING DIAG: M53.3,G89.29 (ICD-10-CM) - Chronic left SI joint pain M47.816 (ICD-10-CM) - Spondylosis of lumbar region without myelopathy or radiculopathy  Rationale for Evaluation and Treatment: Rehabilitation  THERAPY DIAG:  Chronic left SI joint pain  Spondylosis of lumbar region without myelopathy or radiculopathy  ONSET DATE: several years at least back to 2018  SUBJECTIVE:  SUBJECTIVE STATEMENT: General stiffness this morning; no tingling in left leg on arrival; 2/10 pain left PSIS and glute area.  PERTINENT HISTORY:  Has been to therapy before; treated for bulging disc and sciatica Raynauds  PAIN:  Are you having pain? Yes: NPRS scale: 1-8/10 Pain location: low back and down left leg Pain description: "bone on bone"ache, burn and stabbing Aggravating factors: worse at night Relieving factors: Tylenol arthritis  PRECAUTIONS: None  WEIGHT BEARING RESTRICTIONS: No  FALLS:  Has patient fallen in last 6 months? No    OCCUPATION: retired  PLOF: Independent  PATIENT GOALS: get out of pain  NEXT MD VISIT: every 5-6 months (next in June)  OBJECTIVE:   DIAGNOSTIC FINDINGS: from 2021   CLINICAL DATA:  Low back pain. Intermittent left-sided sciatica. Severe nocturnal pain.   EXAM: MRI LUMBAR SPINE WITHOUT CONTRAST   TECHNIQUE: Multiplanar, multisequence MR imaging of the lumbar spine was performed. No intravenous contrast was administered.   COMPARISON:  10/26/2016   FINDINGS: Segmentation:  Standard.   Alignment:  Physiologic.   Vertebrae: No fracture, evidence of discitis, or aggressive bone lesion. T2 hyperintense and T1 intermediate signal bone lesion in the L5  vertebral body unchanged compared with 10/26/2016 with stippled low signal within the lesion likely reflecting an atypical hemangioma.   Conus medullaris and cauda equina: Conus extends to the L1 level. Conus and cauda equina appear normal.   Paraspinal and other soft tissues: No acute paraspinal abnormality. Left parapelvic cysts.   Disc levels:   Disc spaces: Degenerative disease with disc desiccation throughout the lumbar spine with mild disc height loss at L1-2. reactive endplate changes at L5-S1.   T12-L1: No significant disc bulge. No evidence of neural foraminal stenosis. No central canal stenosis.   L1-L2: Mild broad-based disc bulge. No evidence of neural foraminal stenosis. No central canal stenosis.   L2-L3: Mild broad-based disc bulge. No evidence of neural foraminal stenosis. No central canal stenosis.   L3-L4: Mild broad-based disc bulge. No foraminal stenosis. No central canal stenosis.   L4-L5: Broad-based disc bulge with a small right foraminal disc protrusion. Mild right foraminal stenosis. No left foraminal stenosis. No central canal stenosis.   L5-S1: Mild broad-based disc bulge with a small left foraminal disc protrusion. Mild bilateral facet arthropathy. Mild left foraminal stenosis. No right foraminal stenosis. No central canal stenosis.   IMPRESSION: 1. At L4-5 there is a broad-based disc bulge with a small right foraminal disc protrusion. Mild right foraminal stenosis. 2. At L5-S1 there is a mild broad-based disc bulge with a small left foraminal disc protrusion. Mild bilateral facet arthropathy. Mild left foraminal stenosis.     Electronically Signed   By: Elige Ko   On: 03/08/2020 09:47  PATIENT SURVEYS:  FOTO 58    COGNITION: Overall cognitive status: Within functional limits for tasks assessed     SENSATION: Occasional numbness in feet and hands  POSTURE: rounded shoulders, forward head, and weight shift  right  PALPATION: Tender lumbar spine paraspinals; PSIS  LUMBAR ROM:   AROM eval  Flexion 70%  pain low back left and some on right  Extension 60% available felt better with repeated movements  Right lateral flexion   Left lateral flexion   Right rotation   Left rotation    (Blank rows = not tested)  LOWER EXTREMITY ROM:     Active  Right eval Left eval  Hip flexion    Hip extension    Hip abduction  Hip adduction    Hip internal rotation    Hip external rotation    Knee flexion    Knee extension    Ankle dorsiflexion    Ankle plantarflexion    Ankle inversion    Ankle eversion     (Blank rows = not tested)  LOWER EXTREMITY MMT:    MMT Right eval Left eval  Hip flexion 4+ 4-  Hip extension 4+ 4  Hip abduction    Hip adduction    Hip internal rotation    Hip external rotation    Knee flexion 5 4-  Knee extension 5 4  Ankle dorsiflexion 5 4+  Ankle plantarflexion    Ankle inversion    Ankle eversion     (Blank rows = not tested)   FUNCTIONAL TESTS:  5 times sit to stand: 15.46 sec  TODAY'S TREATMENT:                                                                                                                              DATE:  02/24/23 Supine: Bridge x 10 Bridge with ball hip adduction x 10 Bridge with belt hip abduction x 10 Abdominal bracing 5" x 10 Active hamstring stretch with ankle AROM DF/PF nerve floss 2 x 20" each Decompression exercises: Leg lengthener 5" hold x 10 each Leg press 5" hold x 10 Shoulder press 5" hold x 8    02/22/23 Supine:  Abdominal bracing 5" x 10 Pubic clearing Hooklying isometric add against ball then abd in belt 10x 5" holds Bridge 10x while pushing thighs into belt Active hamstring stretch with ankle AROM DF/PF nerve floss 3 x 20" each Checked SI alignment Marching with ab set 10x 5" SKTC 3x 30"  02/16/23 Review of HEP and goals Prone lying x 1' (increased tingling in left foot so  d/c)  Supine: Diaphragmatic breathing x 1' SKTC 20" x 5 each Active hamstring stretch with ankle AROM DF/PF nerve floss 3 x 20" each Abdominal bracing 5" x 10 LTR x 10 DKTC x 5     02/14/23 Physical therapy evaluation and HEP instruction    PATIENT EDUCATION:  Education details: Patient educated on exam findings, POC, scope of PT, HEP, and what to expect next visit. Person educated: Patient Education method: Explanation, Demonstration, and Handouts Education comprehension: verbalized understanding, returned demonstration, verbal cues required, and tactile cues required  HOME EXERCISE PROGRAM: 02/24/23 decompression exercises leg press, leg lengthener and shoulder press Access Code: [email or web address] URL: [email or web address] 02/[email or web address]/23 - Hooklying Isometric Hip Abduction Adduction with Belt and Ball  - 1 x daily - 7 x weekly - 3 sets - 10 reps - 5" hold - Supine Bridge  - 1 x daily - 7 x weekly - 3 sets - 10 reps - 5" hold Date: 02/16/2023 Prepared by: [facility]  Exercises - Supine Transversus Abdominis Bracing - Hands on Stomach  - 2 x daily - 7 x weekly - 1  sets - 10 reps - 5 sec hold - Supine Gluteal Sets  - 2 x daily - 7 x weekly - 3 sets - 10 reps - 5 sec hold - Hooklying Single Knee to Chest  - 2 x daily - 7 x weekly - 1 sets - 3 reps - 20 sec hold - Supine Lower Trunk Rotation  - 2 x daily - 7 x weekly - 1 sets - 10 reps - 2" hold - Supine Sciatic Nerve Glide  - 1 x daily - 7 x weekly - 1 sets - 3 reps - 20"  hold  Access Code: [email or web address] URL: [email or web address] Date: 02/14/2023 Prepared by: [facility]  Exercises - Prone Gluteal Sets  - 2 x daily - 7 x weekly - 1 sets - 10 reps - 5 sec hold - Supine Transversus Abdominis Bracing - Hands on Stomach  - 1 x daily - 7 x weekly - 1 sets - 10 reps - 5 sec hold  ASSESSMENT:  CLINICAL IMPRESSION: Has not moved around a lot this morning; generally stiff; no tingling in leg on arrival today; 2/10 pain  left glute area. Continued with some symptoms into left shin but no noted symptoms in arch.  In standing; noted left iliac crest higher than right.  Added leg lengthener and leg press, shoulder press today for postural correction. Patient will benefit from continued skilled therapy services to address deficits and promote return to optimal function.       Eval:Patient is a 67 y.o. female who was seen today for physical therapy evaluation and treatment for low back pain and down into left leg. Patient demonstrates muscle weakness, reduced ROM, and fascial restrictions which are likely contributing to symptoms of pain and are negatively impacting patient ability to perform ADLs and functional mobility tasks. Patient will benefit from skilled physical therapy services to address these deficits to reduce pain and improve level of function with ADLs and functional mobility tasks.   OBJECTIVE IMPAIRMENTS: decreased activity tolerance, decreased endurance, decreased knowledge of condition, decreased mobility, difficulty walking, decreased ROM, decreased strength, hypomobility, increased fascial restrictions, impaired perceived functional ability, increased muscle spasms, impaired flexibility, and pain.   ACTIVITY LIMITATIONS: carrying, lifting, bending, sitting, standing, squatting, sleeping, stairs, transfers, locomotion level, and caring for others  PARTICIPATION LIMITATIONS: meal prep, cleaning, laundry, medication management, community activity, and yard work  [facility] POTENTIAL: Good  CLINICAL DECISION MAKING: Stable/uncomplicated  EVALUATION COMPLEXITY: Low   GOALS: Goals reviewed with patient? No  SHORT TERM GOALS: Target date: 02/28/2023  patient will be independent with initial HEP Baseline: Goal status: IN PROGRESS  2.  Patient will self report 30% improvement to improve tolerance for functional activity   Baseline:  Goal status: IN PROGRESS   LONG TERM GOALS: Target date:  03/14/2023  Patient will be independent in self management strategies to improve quality of life and functional outcomes.   Baseline:  Goal status: IN PROGRESS  2.  Patient will self report 50% improvement to improve tolerance for functional activity   Baseline:  Goal status: IN PROGRESS  3.  Patient will improve FOTO score by 10 points  Baseline: 58 Goal status: IN PROGRESS  4.  Patient will increase left leg MMTs to 4+ to 5/5 without pain to promote return to ambulation community distances with minimal deviation.  Baseline: see above Goal status: IN PROGRESS  5.  Patient will improve 5 times sit to stand score from 15.46 sec sec to 12 sec to demonstrate improved  functional mobility and increased lower extremity strength.  Baseline:  Goal status: IN PROGRESS  6.  Patient will stand and walk x 30 min without radiating left leg symptoms past her knee to improve ability to perform yard and house work efficiently Baseline:  Goal status: IN PROGRESS  PLAN:  PT FREQUENCY: 2x/week  PT DURATION: 4 weeks  PLANNED INTERVENTIONS: Therapeutic exercises, Therapeutic activity, Neuromuscular re-education, Balance training, Gait training, Patient/Family education, Joint manipulation, Joint mobilization, Stair training, Orthotic/Fit training, DME instructions, Aquatic Therapy, Dry Needling, Electrical stimulation, Spinal manipulation, Spinal mobilization, Cryotherapy, Moist heat, Compression bandaging, scar mobilization, Splintting, Taping, Traction, Ultrasound, Ionotophoresis 4mg /ml Dexamethasone, and Manual therapy .  PLAN FOR NEXT SESSION:  centralize symptoms; core strengthening. Progress decompression exercises   10:30 AM, 02/24/23 Myrl Lazarus Small Reis Goga MPT [facility] [place] [phone]

## 2023-03-01 ENCOUNTER — Encounter (HOSPITAL_COMMUNITY): Payer: Self-pay

## 2023-03-01 ENCOUNTER — Ambulatory Visit (HOSPITAL_COMMUNITY): Payer: Medicare PPO

## 2023-03-01 DIAGNOSIS — M533 Sacrococcygeal disorders, not elsewhere classified: Secondary | ICD-10-CM | POA: Diagnosis not present

## 2023-03-01 DIAGNOSIS — G8929 Other chronic pain: Secondary | ICD-10-CM

## 2023-03-01 NOTE — Therapy (Signed)
OUTPATIENT PHYSICAL THERAPY THORACOLUMBAR TREATMENT   Patient Name: Monica [name] MRN: [id number] DOB:Aug 14, 1956, 67 y.o., female Today's Date: 03/01/2023  END OF SESSION:  PT End of Session - 03/01/23 1126     Visit Number 5    Number of Visits 8    Date for PT Re-Evaluation 03/14/23    Authorization Type [employer]    Authorization Time Period 1 re-eval and 8 visits from 02/14/23 to 03/14/23    Authorization - Visit Number 5    Authorization - Number of Visits 8    PT Start Time 1049    PT Stop Time 1127    PT Time Calculation (min) 38 min               Past Medical History:  Diagnosis Date   Arthritis    Fibromyalgia    Macular degeneration    Left eye   Osteopenia 05/2018   T score -2.3 FRAX 11% / 1.8% overall stable from prior DEXA   RA (rheumatoid arthritis) (HCC)    Raynaud disease    Reiters syndrome    Sleep disturbance    Vaginal dryness    Past Surgical History:  Procedure Laterality Date   BREAST SURGERY     LEFT BR LUMP-Benign   COLONOSCOPY N/A 08/04/2018   Procedure: COLONOSCOPY;  Surgeon: West Bali, MD;  Location: [facility];  Service: Endoscopy;  Laterality: N/A;  8:30   POLYPECTOMY  08/04/2018   Procedure: POLYPECTOMY;  Surgeon: West Bali, MD;  Location: [facility];  Service: Endoscopy;;   Patient Active Problem List   Diagnosis Date Noted   Special screening for malignant neoplasms, colon    High risk medication use 01/13/2017   History of iritis 01/13/2017   Essential hypertension 01/13/2017   Macular degeneration 01/13/2017   Spondylosis of lumbar region without myelopathy or radiculopathy 01/13/2017   Leg weakness 07/08/2014   Difficulty walking 07/08/2014   Vaginal dryness    Reiter's syndrome (HCC)    Raynaud disease    Fibromyalgia    Inflammatory arthritis    Osteopenia     PCP: Gareth Morgan, MD  REFERRING PROVIDER: Pollyann Savoy, MD  REFERRING DIAG: M53.3,G89.29 (ICD-10-CM) - Chronic  left SI joint pain M47.816 (ICD-10-CM) - Spondylosis of lumbar region without myelopathy or radiculopathy  Rationale for Evaluation and Treatment: Rehabilitation  THERAPY DIAG:  Chronic left SI joint pain  ONSET DATE: several years at least back to 2018  SUBJECTIVE:  SUBJECTIVE STATEMENT: Pt stated she is feeling improvements following additional exercises given last 2 sessions.  No reports of pain currently.    PERTINENT HISTORY:  Has been to therapy before; treated for bulging disc and sciatica Raynauds  PAIN:  Are you having pain? 03/01/23:  none currently Yes: NPRS scale: 1-8/10 Pain location: low back and down left leg Pain description: "bone on bone"ache, burn and stabbing Aggravating factors: worse at night Relieving factors: Tylenol arthritis  PRECAUTIONS: None  WEIGHT BEARING RESTRICTIONS: No  FALLS:  Has patient fallen in last 6 months? No    OCCUPATION: retired  PLOF: Independent  PATIENT GOALS: get out of pain  NEXT MD VISIT: every 5-6 months (next in June)  OBJECTIVE:   DIAGNOSTIC FINDINGS: from 2021   CLINICAL DATA:  Low back pain. Intermittent left-sided sciatica. Severe nocturnal pain.   EXAM: MRI LUMBAR SPINE WITHOUT CONTRAST   TECHNIQUE: Multiplanar, multisequence MR imaging of the lumbar spine was performed. No intravenous contrast was administered.   COMPARISON:  10/26/2016   FINDINGS: Segmentation:  Standard.   Alignment:  Physiologic.   Vertebrae: No fracture, evidence of discitis, or aggressive bone lesion. T2 hyperintense and T1 intermediate signal bone lesion in the L5 vertebral body unchanged compared with 10/26/2016 with stippled low signal within the lesion likely reflecting an atypical hemangioma.   Conus medullaris and cauda equina:  Conus extends to the L1 level. Conus and cauda equina appear normal.   Paraspinal and other soft tissues: No acute paraspinal abnormality. Left parapelvic cysts.   Disc levels:   Disc spaces: Degenerative disease with disc desiccation throughout the lumbar spine with mild disc height loss at L1-2. reactive endplate changes at L5-S1.   T12-L1: No significant disc bulge. No evidence of neural foraminal stenosis. No central canal stenosis.   L1-L2: Mild broad-based disc bulge. No evidence of neural foraminal stenosis. No central canal stenosis.   L2-L3: Mild broad-based disc bulge. No evidence of neural foraminal stenosis. No central canal stenosis.   L3-L4: Mild broad-based disc bulge. No foraminal stenosis. No central canal stenosis.   L4-L5: Broad-based disc bulge with a small right foraminal disc protrusion. Mild right foraminal stenosis. No left foraminal stenosis. No central canal stenosis.   L5-S1: Mild broad-based disc bulge with a small left foraminal disc protrusion. Mild bilateral facet arthropathy. Mild left foraminal stenosis. No right foraminal stenosis. No central canal stenosis.   IMPRESSION: 1. At L4-5 there is a broad-based disc bulge with a small right foraminal disc protrusion. Mild right foraminal stenosis. 2. At L5-S1 there is a mild broad-based disc bulge with a small left foraminal disc protrusion. Mild bilateral facet arthropathy. Mild left foraminal stenosis.     Electronically Signed   By: Elige Ko   On: 03/08/2020 09:47  PATIENT SURVEYS:  FOTO 58    COGNITION: Overall cognitive status: Within functional limits for tasks assessed     SENSATION: Occasional numbness in feet and hands  POSTURE: rounded shoulders, forward head, and weight shift right  PALPATION: Tender lumbar spine paraspinals; PSIS  LUMBAR ROM:   AROM eval  Flexion 70%  pain low back left and some on right  Extension 60% available felt better with repeated  movements  Right lateral flexion   Left lateral flexion   Right rotation   Left rotation    (Blank rows = not tested)  LOWER EXTREMITY ROM:     Active  Right eval Left eval  Hip flexion    Hip extension  Hip abduction    Hip adduction    Hip internal rotation    Hip external rotation    Knee flexion    Knee extension    Ankle dorsiflexion    Ankle plantarflexion    Ankle inversion    Ankle eversion     (Blank rows = not tested)  LOWER EXTREMITY MMT:    MMT Right eval Left eval  Hip flexion 4+ 4-  Hip extension 4+ 4  Hip abduction    Hip adduction    Hip internal rotation    Hip external rotation    Knee flexion 5 4-  Knee extension 5 4  Ankle dorsiflexion 5 4+  Ankle plantarflexion    Ankle inversion    Ankle eversion     (Blank rows = not tested)   FUNCTIONAL TESTS:  5 times sit to stand: 15.46 sec  TODAY'S TREATMENT:                                                                                                                              DATE:  27-Mar-2023: Supine: Pelvic tilt Dead bug with ab set Bridge with belt hip abduction 2x 10 5" holds Decompression with green theraband 10x  Sidelying: clam with GTB 10x 5"  02/24/23 Supine: Bridge x 10 Bridge with ball hip adduction x 10 Bridge with belt hip abduction x 10 Abdominal bracing 5" x 10 Active hamstring stretch with ankle AROM DF/PF nerve floss 2 x 20" each Decompression exercises: Leg lengthener 5" hold x 10 each Leg press 5" hold x 10 Shoulder press 5" hold x 8    02/22/23 Supine:  Abdominal bracing 5" x 10 Pubic clearing Hooklying isometric add against ball then abd in belt 10x 5" holds Bridge 10x while pushing thighs into belt Active hamstring stretch with ankle AROM DF/PF nerve floss 3 x 20" each Checked SI alignment Marching with ab set 10x 5" SKTC 3x 30"  02/16/23 Review of HEP and goals Prone lying x 1' (increased tingling in left foot so d/c)  Supine: Diaphragmatic  breathing x 1' SKTC 20" x 5 each Active hamstring stretch with ankle AROM DF/PF nerve floss 3 x 20" each Abdominal bracing 5" x 10 LTR x 10 DKTC x 5     02/14/23 Physical therapy evaluation and HEP instruction    PATIENT EDUCATION:  Education details: Patient educated on exam findings, POC, scope of PT, HEP, and what to expect next visit. Person educated: Patient Education method: Explanation, Demonstration, and Handouts Education comprehension: verbalized understanding, returned demonstration, verbal cues required, and tactile cues required  HOME EXERCISE PROGRAM: 02/24/23 decompression exercises leg press, leg lengthener and shoulder press Access Code: [email or web address] URL: [email or web address] 02/22/23 - Hooklying Isometric Hip Abduction Adduction with Belt and Ball  - 1 x daily - 7 x weekly - 3 sets - 10 reps - 5" hold - Supine Bridge  - 1 x daily - 7 x weekly - 3  sets - 10 reps - 5" hold Date: 02/16/2023 Prepared by: [facility]  Exercises - Supine Transversus Abdominis Bracing - Hands on Stomach  - 2 x daily - 7 x weekly - 1 sets - 10 reps - 5 sec hold - Supine Gluteal Sets  - 2 x daily - 7 x weekly - 3 sets - 10 reps - 5 sec hold - Hooklying Single Knee to Chest  - 2 x daily - 7 x weekly - 1 sets - 3 reps - 20 sec hold - Supine Lower Trunk Rotation  - 2 x daily - 7 x weekly - 1 sets - 10 reps - 2" hold - Supine Sciatic Nerve Glide  - 1 x daily - 7 x weekly - 1 sets - 3 reps - 20"  hold  Access Code: [email or web address] URL: [email or web address] Date: 02/14/2023 Prepared by: [facility]  Exercises - Prone Gluteal Sets  - 2 x daily - 7 x weekly - 1 sets - 10 reps - 5 sec hold - Supine Transversus Abdominis Bracing - Hands on Stomach  - 1 x daily - 7 x weekly - 1 sets - 10 reps - 5 sec hold  ASSESSMENT:  CLINICAL IMPRESSION: Progressed to theraband decompression exercises for postural strengthening with positive results.  Continued core and proximal  strengthening exercises for stability with good tolerance.  No reports of pain through session, did report intermittent tingling into Lt shin during sidelying clam exercise.    Eval:Patient is a 66 y.o. female who was seen today for physical therapy evaluation and treatment for low back pain and down into left leg. Patient demonstrates muscle weakness, reduced ROM, and fascial restrictions which are likely contributing to symptoms of pain and are negatively impacting patient ability to perform ADLs and functional mobility tasks. Patient will benefit from skilled physical therapy services to address these deficits to reduce pain and improve level of function with ADLs and functional mobility tasks.   OBJECTIVE IMPAIRMENTS: decreased activity tolerance, decreased endurance, decreased knowledge of condition, decreased mobility, difficulty walking, decreased ROM, decreased strength, hypomobility, increased fascial restrictions, impaired perceived functional ability, increased muscle spasms, impaired flexibility, and pain.   ACTIVITY LIMITATIONS: carrying, lifting, bending, sitting, standing, squatting, sleeping, stairs, transfers, locomotion level, and caring for others  PARTICIPATION LIMITATIONS: meal prep, cleaning, laundry, medication management, community activity, and yard work  [facility] POTENTIAL: Good  CLINICAL DECISION MAKING: Stable/uncomplicated  EVALUATION COMPLEXITY: Low   GOALS: Goals reviewed with patient? No  SHORT TERM GOALS: Target date: 02/28/2023  patient will be independent with initial HEP Baseline: Goal status: IN PROGRESS  2.  Patient will self report 30% improvement to improve tolerance for functional activity   Baseline:  Goal status: IN PROGRESS   LONG TERM GOALS: Target date: 03/14/2023  Patient will be independent in self management strategies to improve quality of life and functional outcomes.   Baseline:  Goal status: IN PROGRESS  2.  Patient will  self report 50% improvement to improve tolerance for functional activity   Baseline:  Goal status: IN PROGRESS  3.  Patient will improve FOTO score by 10 points  Baseline: 58 Goal status: IN PROGRESS  4.  Patient will increase left leg MMTs to 4+ to 5/5 without pain to promote return to ambulation community distances with minimal deviation.  Baseline: see above Goal status: IN PROGRESS  5.  Patient will improve 5 times sit to stand score from 15.46 sec sec to 12 sec to demonstrate improved functional  mobility and increased lower extremity strength.  Baseline:  Goal status: IN PROGRESS  6.  Patient will stand and walk x 30 min without radiating left leg symptoms past her knee to improve ability to perform yard and house work efficiently Baseline:  Goal status: IN PROGRESS  PLAN:  PT FREQUENCY: 2x/week  PT DURATION: 4 weeks  PLANNED INTERVENTIONS: Therapeutic exercises, Therapeutic activity, Neuromuscular re-education, Balance training, Gait training, Patient/Family education, Joint manipulation, Joint mobilization, Stair training, Orthotic/Fit training, DME instructions, Aquatic Therapy, Dry Needling, Electrical stimulation, Spinal manipulation, Spinal mobilization, Cryotherapy, Moist heat, Compression bandaging, scar mobilization, Splintting, Taping, Traction, Ultrasound, Ionotophoresis 4mg /ml Dexamethasone, and Manual therapy .  PLAN FOR NEXT SESSION:  centralize symptoms; core strengthening. Add prone heel squeeze and progress to quadruped exercises.  Becky Sax, LPTA/CLT; CBIS [phone]  12:53 PM, 03/01/23

## 2023-03-03 ENCOUNTER — Ambulatory Visit (HOSPITAL_COMMUNITY): Payer: Medicare PPO

## 2023-03-03 ENCOUNTER — Encounter (HOSPITAL_COMMUNITY): Payer: Self-pay

## 2023-03-03 DIAGNOSIS — G8929 Other chronic pain: Secondary | ICD-10-CM

## 2023-03-03 DIAGNOSIS — M533 Sacrococcygeal disorders, not elsewhere classified: Secondary | ICD-10-CM | POA: Diagnosis not present

## 2023-03-03 DIAGNOSIS — M47816 Spondylosis without myelopathy or radiculopathy, lumbar region: Secondary | ICD-10-CM

## 2023-03-03 NOTE — Therapy (Signed)
OUTPATIENT PHYSICAL THERAPY THORACOLUMBAR TREATMENT   Patient Name: Monica [name] MRN: [id number] DOB:Feb 28, 1956, 67 y.o., female Today's Date: 03/03/2023  END OF SESSION:  PT End of Session - 03/03/23 1142     Visit Number 6    Number of Visits 8    Date for PT Re-Evaluation 03/14/23    Authorization Type [employer] Choice    Authorization Time Period 1 re-eval and 8 visits from 02/14/23 to 03/14/23    Authorization - Visit Number 6    Authorization - Number of Visits 8    Progress Note Due on Visit 8    PT Start Time 1136    Activity Tolerance Patient tolerated treatment well    Behavior During Therapy [facility] for tasks assessed/performed               Past Medical History:  Diagnosis Date   Arthritis    Fibromyalgia    Macular degeneration    Left eye   Osteopenia 05/2018   T score -2.3 FRAX 11% / 1.8% overall stable from prior DEXA   RA (rheumatoid arthritis) (HCC)    Raynaud disease    Reiters syndrome    Sleep disturbance    Vaginal dryness    Past Surgical History:  Procedure Laterality Date   BREAST SURGERY     LEFT BR LUMP-Benign   COLONOSCOPY N/A 08/04/2018   Procedure: COLONOSCOPY;  Surgeon: West Bali, MD;  [facility]: [facility];  Service: Endoscopy;  Laterality: N/A;  8:30   POLYPECTOMY  08/04/2018   Procedure: POLYPECTOMY;  Surgeon: West Bali, MD;  Location: [facility];  Service: Endoscopy;;   Patient Active Problem List   Diagnosis Date Noted   Special screening for malignant neoplasms, colon    High risk medication use 01/13/2017   History of iritis 01/13/2017   Essential hypertension 01/13/2017   Macular degeneration 01/13/2017   Spondylosis of lumbar region without myelopathy or radiculopathy 01/13/2017   Leg weakness 07/08/2014   Difficulty walking 07/08/2014   Vaginal dryness    Reiter's syndrome (HCC)    Raynaud disease    Fibromyalgia    Inflammatory arthritis    Osteopenia     PCP: Gareth Morgan,  MD  REFERRING PROVIDER: Pollyann Savoy, MD  REFERRING DIAG: M53.3,G89.29 (ICD-10-CM) - Chronic left SI joint pain M47.816 (ICD-10-CM) - Spondylosis of lumbar region without myelopathy or radiculopathy  Rationale for Evaluation and Treatment: Rehabilitation  THERAPY DIAG:  Chronic left SI joint pain  Spondylosis of lumbar region without myelopathy or radiculopathy  ONSET DATE: several years at least back to 2018  SUBJECTIVE:  SUBJECTIVE STATEMENT: Reports no Tylenol prior bed for the last 3 days.  Feeling good today with no pain or radicular symptoms.  PERTINENT HISTORY:  Has been to therapy before; treated for bulging disc and sciatica Raynauds  PAIN:  Are you having pain? 03/01/23:  none currently Yes: NPRS scale: 1-8/10 Pain location: low back and down left leg Pain description: "bone on bone"ache, burn and stabbing Aggravating factors: worse at night Relieving factors: Tylenol arthritis  PRECAUTIONS: None  WEIGHT BEARING RESTRICTIONS: No  FALLS:  Has patient fallen in last 6 months? No    OCCUPATION: retired  PLOF: Independent  PATIENT GOALS: get out of pain  NEXT MD VISIT: every 5-6 months (next in June)  OBJECTIVE:   DIAGNOSTIC FINDINGS: from 2021   CLINICAL DATA:  Low back pain. Intermittent left-sided sciatica. Severe nocturnal pain.   EXAM: MRI LUMBAR SPINE WITHOUT CONTRAST   TECHNIQUE: Multiplanar, multisequence MR imaging of the lumbar spine was performed. No intravenous contrast was administered.   COMPARISON:  10/26/2016   FINDINGS: Segmentation:  Standard.   Alignment:  Physiologic.   Vertebrae: No fracture, evidence of discitis, or aggressive bone lesion. T2 hyperintense and T1 intermediate signal bone lesion in the L5 vertebral body unchanged  compared with 10/26/2016 with stippled low signal within the lesion likely reflecting an atypical hemangioma.   Conus medullaris and cauda equina: Conus extends to the L1 level. Conus and cauda equina appear normal.   Paraspinal and other soft tissues: No acute paraspinal abnormality. Left parapelvic cysts.   Disc levels:   Disc spaces: Degenerative disease with disc desiccation throughout the lumbar spine with mild disc height loss at L1-2. reactive endplate changes at L5-S1.   T12-L1: No significant disc bulge. No evidence of neural foraminal stenosis. No central canal stenosis.   L1-L2: Mild broad-based disc bulge. No evidence of neural foraminal stenosis. No central canal stenosis.   L2-L3: Mild broad-based disc bulge. No evidence of neural foraminal stenosis. No central canal stenosis.   L3-L4: Mild broad-based disc bulge. No foraminal stenosis. No central canal stenosis.   L4-L5: Broad-based disc bulge with a small right foraminal disc protrusion. Mild right foraminal stenosis. No left foraminal stenosis. No central canal stenosis.   L5-S1: Mild broad-based disc bulge with a small left foraminal disc protrusion. Mild bilateral facet arthropathy. Mild left foraminal stenosis. No right foraminal stenosis. No central canal stenosis.   IMPRESSION: 1. At L4-5 there is a broad-based disc bulge with a small right foraminal disc protrusion. Mild right foraminal stenosis. 2. At L5-S1 there is a mild broad-based disc bulge with a small left foraminal disc protrusion. Mild bilateral facet arthropathy. Mild left foraminal stenosis.     Electronically Signed   By: Elige Ko   On: 03/08/2020 09:47  PATIENT SURVEYS:  FOTO 58    COGNITION: Overall cognitive status: Within functional limits for tasks assessed     SENSATION: Occasional numbness in feet and hands  POSTURE: rounded shoulders, forward head, and weight shift right  PALPATION: Tender lumbar spine  paraspinals; PSIS  LUMBAR ROM:   AROM eval  Flexion 70%  pain low back left and some on right  Extension 60% available felt better with repeated movements  Right lateral flexion   Left lateral flexion   Right rotation   Left rotation    (Blank rows = not tested)  LOWER EXTREMITY ROM:     Active  Right eval Left eval  Hip flexion    Hip extension  Hip abduction    Hip adduction    Hip internal rotation    Hip external rotation    Knee flexion    Knee extension    Ankle dorsiflexion    Ankle plantarflexion    Ankle inversion    Ankle eversion     (Blank rows = not tested)  LOWER EXTREMITY MMT:    MMT Right eval Left eval  Hip flexion 4+ 4-  Hip extension 4+ 4  Hip abduction    Hip adduction    Hip internal rotation    Hip external rotation    Knee flexion 5 4-  Knee extension 5 4  Ankle dorsiflexion 5 4+  Ankle plantarflexion    Ankle inversion    Ankle eversion     (Blank rows = not tested)   FUNCTIONAL TESTS:  5 times sit to stand: 15.46 sec  TODAY'S TREATMENT:                                                                                                                              DATE:  03/03/23 Prone: Heel squeeze with pillow under hips 10 x 5" Hip extension 10x   Quadruped: UE 5x 5" LE 5x 5"  Sidelying: abduction 10x  Supine:   Bridge with belt hip abduction 2x 10 5" holds Isometric hip flexion with theraball 10x 5" holds each  Oblique with theraball  Discussed benefits with seated posture on theraball 03/21/23: Supine: Pelvic tilt Dead bug with ab set Bridge with belt hip abduction 2x 10 5" holds Decompression with green theraband 10x  Sidelying: clam with GTB 10x 5"  02/24/23 Supine: Bridge x 10 Bridge with ball hip adduction x 10 Bridge with belt hip abduction x 10 Abdominal bracing 5" x 10 Active hamstring stretch with ankle AROM DF/PF nerve floss 2 x 20" each Decompression exercises: Leg lengthener 5" hold x 10  each Leg press 5" hold x 10 Shoulder press 5" hold x 8    02/22/23 Supine:  Abdominal bracing 5" x 10 Pubic clearing Hooklying isometric add against ball then abd in belt 10x 5" holds Bridge 10x while pushing thighs into belt Active hamstring stretch with ankle AROM DF/PF nerve floss 3 x 20" each Checked SI alignment Marching with ab set 10x 5" SKTC 3x 30"  02/16/23 Review of HEP and goals Prone lying x 1' (increased tingling in left foot so d/c)  Supine: Diaphragmatic breathing x 1' SKTC 20" x 5 each Active hamstring stretch with ankle AROM DF/PF nerve floss 3 x 20" each Abdominal bracing 5" x 10 LTR x 10 DKTC x 5     02/14/23 Physical therapy evaluation and HEP instruction    PATIENT EDUCATION:  Education details: Patient educated on exam findings, POC, scope of PT, HEP, and what to expect next visit. Person educated: Patient Education method: Explanation, Demonstration, and Handouts Education comprehension: verbalized understanding, returned demonstration, verbal cues required, and tactile cues required  HOME EXERCISE PROGRAM: 03/02/20: sidelying abd 03/01/23: UE decompression exercise with UE, given GTB 02/24/23 decompression exercises leg press, leg lengthener and shoulder press Access Code: [email or web address] URL: [email or web address] 02/22/23 - Hooklying Isometric Hip Abduction Adduction with Belt and Ball  - 1 x daily - 7 x weekly - 3 sets - 10 reps - 5" hold - Supine Bridge  - 1 x daily - 7 x weekly - 3 sets - 10 reps - 5" hold Date: 02/16/2023 Prepared by: [facility]  Exercises - Supine Transversus Abdominis Bracing - Hands on Stomach  - 2 x daily - 7 x weekly - 1 sets - 10 reps - 5 sec hold - Supine Gluteal Sets  - 2 x daily - 7 x weekly - 3 sets - 10 reps - 5 sec hold - Hooklying Single Knee to Chest  - 2 x daily - 7 x weekly - 1 sets - 3 reps - 20 sec hold - Supine Lower Trunk Rotation  - 2 x daily - 7 x weekly - 1 sets - 10 reps - 2" hold - Supine  Sciatic Nerve Glide  - 1 x daily - 7 x weekly - 1 sets - 3 reps - 20"  hold  Access Code: [email or web address] URL: [email or web address] Date: 02/14/2023 Prepared by: [facility]  Exercises - Prone Gluteal Sets  - 2 x daily - 7 x weekly - 1 sets - 10 reps - 5 sec hold - Supine Transversus Abdominis Bracing - Hands on Stomach  - 1 x daily - 7 x weekly - 1 sets - 10 reps - 5 sec hold  ASSESSMENT:  CLINICAL IMPRESSION: Added prone and quadruped exercises for core and proximal strengthening with min cueing to improve lumbar stability.  Pt tolerated well with no reports of pain through session.  Did c/o some intermittent tingling into Lt shin during clam exercise that was resolved following exercise.     Eval:Patient is a 67 y.o. female who was seen today for physical therapy evaluation and treatment for low back pain and down into left leg. Patient demonstrates muscle weakness, reduced ROM, and fascial restrictions which are likely contributing to symptoms of pain and are negatively impacting patient ability to perform ADLs and functional mobility tasks. Patient will benefit from skilled physical therapy services to address these deficits to reduce pain and improve level of function with ADLs and functional mobility tasks.   OBJECTIVE IMPAIRMENTS: decreased activity tolerance, decreased endurance, decreased knowledge of condition, decreased mobility, difficulty walking, decreased ROM, decreased strength, hypomobility, increased fascial restrictions, impaired perceived functional ability, increased muscle spasms, impaired flexibility, and pain.   ACTIVITY LIMITATIONS: carrying, lifting, bending, sitting, standing, squatting, sleeping, stairs, transfers, locomotion level, and caring for others  PARTICIPATION LIMITATIONS: meal prep, cleaning, laundry, medication management, community activity, and yard work  [facility] POTENTIAL: Good  CLINICAL DECISION MAKING: Stable/uncomplicated  EVALUATION  COMPLEXITY: Low   GOALS: Goals reviewed with patient? No  SHORT TERM GOALS: Target date: 02/28/2023  patient will be independent with initial HEP Baseline: Goal status: IN PROGRESS  2.  Patient will self report 30% improvement to improve tolerance for functional activity   Baseline:  Goal status: IN PROGRESS   LONG TERM GOALS: Target date: 03/14/2023  Patient will be independent in self management strategies to improve quality of life and functional outcomes.   Baseline:  Goal status: IN PROGRESS  2.  Patient will self report 50% improvement to improve tolerance for functional activity   Baseline:  Goal  status: IN PROGRESS  3.  Patient will improve FOTO score by 10 points  Baseline: 58 Goal status: IN PROGRESS  4.  Patient will increase left leg MMTs to 4+ to 5/5 without pain to promote return to ambulation community distances with minimal deviation.  Baseline: see above Goal status: IN PROGRESS  5.  Patient will improve 5 times sit to stand score from 15.46 sec sec to 12 sec to demonstrate improved functional mobility and increased lower extremity strength.  Baseline:  Goal status: IN PROGRESS  6.  Patient will stand and walk x 30 min without radiating left leg symptoms past her knee to improve ability to perform yard and house work efficiently Baseline:  Goal status: IN PROGRESS  PLAN:  PT FREQUENCY: 2x/week  PT DURATION: 4 weeks  PLANNED INTERVENTIONS: Therapeutic exercises, Therapeutic activity, Neuromuscular re-education, Balance training, Gait training, Patient/Family education, Joint manipulation, Joint mobilization, Stair training, Orthotic/Fit training, DME instructions, Aquatic Therapy, Dry Needling, Electrical stimulation, Spinal manipulation, Spinal mobilization, Cryotherapy, Moist heat, Compression bandaging, scar mobilization, Splintting, Taping, Traction, Ultrasound, Ionotophoresis 4mg /ml Dexamethasone, and Manual therapy .  PLAN FOR NEXT  SESSION:  centralize symptoms; core strengthening. Standing stability exercises, squats, vector stance  Becky Sax, LPTA/CLT; CBIS [phone]  11:44 AM, 03/03/23

## 2023-03-09 ENCOUNTER — Encounter (HOSPITAL_COMMUNITY): Payer: Medicare PPO

## 2023-03-10 ENCOUNTER — Ambulatory Visit (HOSPITAL_COMMUNITY): Payer: Medicare PPO

## 2023-03-10 ENCOUNTER — Encounter (HOSPITAL_COMMUNITY): Payer: Self-pay

## 2023-03-10 ENCOUNTER — Other Ambulatory Visit: Payer: Self-pay | Admitting: Physician Assistant

## 2023-03-10 DIAGNOSIS — M47816 Spondylosis without myelopathy or radiculopathy, lumbar region: Secondary | ICD-10-CM

## 2023-03-10 DIAGNOSIS — M533 Sacrococcygeal disorders, not elsewhere classified: Secondary | ICD-10-CM | POA: Diagnosis not present

## 2023-03-10 DIAGNOSIS — G8929 Other chronic pain: Secondary | ICD-10-CM

## 2023-03-10 NOTE — Telephone Encounter (Signed)
Last Fill: 12/20/2022  Labs: 01/11/2023 CBC, CMP, TSH, vitamin D, PTH, SPEP normal   TB Gold: 06/03/2022 negative    Next Visit: 03/24/2023  Last Visit: 11/29/2022  GN:[id number] arthritis   Current Dose per office note on 11/29/2022: Humira 40 mg every 14 days   Okay to refill Humira?

## 2023-03-10 NOTE — Progress Notes (Signed)
Office Visit Note  Patient: Monica [name]             Date of Birth: 1956-09-24           MRN: [id number]             PCP: Gareth Morgan, MD Referring: Gareth Morgan, MD Visit Date: 03/24/2023 Occupation: @GUAROCC @  Subjective:  Medication management  History of Present Illness: Monica SPINALE is a 67 y.o. female with history of inflammatory arthritis and iritis.  She states she has not had a flare of iritis.  She has an appointment coming up with the ophthalmologist and July.  She notices intermittent redness in her eyes which she is not sure if due to allergies.  She has been going to physical therapy which she finds helpful.  They have been working on her SI joints.  They also found that she had mild leg length discrepancy which could be contributing to her SI joint discomfort.  She still has occasional lower back discomfort.  She denies any radiculopathy.  She has occasional knee joint discomfort.  She has not noticed any joint swelling.  Raynauds symptoms are less active now with the warmer weather.  She continues to take sulfasalazine 500 mg 1 tablet twice a day and Humira 40 mg subcu every 14 days.  She had no interruption in the treatment.    Activities of Daily Living:  Patient reports morning stiffness for a few minutes.   Patient Reports nocturnal pain.  Difficulty dressing/grooming: Denies Difficulty climbing stairs: Denies Difficulty getting out of chair: Denies Difficulty using hands for taps, buttons, cutlery, and/or writing: Denies  Review of Systems  Constitutional:  Negative for fatigue.  HENT:  Negative for mouth sores and mouth dryness.   Eyes:  Positive for dryness.  Respiratory:  Negative for shortness of breath.   Cardiovascular:  Negative for chest pain and palpitations.  Gastrointestinal:  Negative for blood in stool, constipation and diarrhea.  Endocrine: Negative for increased urination.  Genitourinary:  Negative for involuntary urination.   Musculoskeletal:  Positive for joint pain, joint pain and morning stiffness. Negative for gait problem, joint swelling, myalgias, muscle weakness, muscle tenderness and myalgias.  Skin:  Positive for color change and hair loss. Negative for rash and sensitivity to sunlight.  Allergic/Immunologic: Negative for susceptible to infections.  Neurological:  Negative for dizziness and headaches.  Hematological:  Negative for swollen glands.  Psychiatric/Behavioral:  Negative for depressed mood and sleep disturbance. The patient is not nervous/anxious.     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HCC)    Raynaud disease    Fibromyalgia    Inflammatory arthritis    Osteopenia     Past Medical History:  Diagnosis Date   Arthritis    Fibromyalgia    Macular degeneration    Left eye   Osteopenia 05/2018   T score -2.3 FRAX 11% / 1.8% overall stable from prior DEXA   RA (rheumatoid arthritis) (HCC)    Raynaud disease    Reiters syndrome    Sleep disturbance    Vaginal dryness  Family History  Problem Relation Age of Onset   Alzheimer's disease Mother    Osteoarthritis Mother    Hypertension Sister    High Cholesterol Sister    Heart disease Brother        open heart surgery   Hyperlipidemia Brother    Heart attack Brother    Heart attack Brother    Heart disease Brother    Ulcers Brother        multiple bleeding ulcers   Healthy Son    Healthy Son    Past Surgical History:  Procedure Laterality Date   BREAST SURGERY     LEFT BR LUMP-Benign   COLONOSCOPY N/A 08/04/2018    Procedure: COLONOSCOPY;  Surgeon: West Bali, MD;  [facility];  Service: Endoscopy;  Laterality: N/A;  8:30   POLYPECTOMY  08/04/2018   Procedure: POLYPECTOMY;  Surgeon: West Bali, MD;  [facility];  Service: Endoscopy;;   Social History   Social History Narrative   Not on file   Immunization History  Administered Date(s) Administered   Influenza,inj,Quad PF,6+ Mos 08/24/2012, 09/25/2013, 11/13/2014, 11/17/2017   Moderna Sars-Covid-2 Vaccination 12/05/2019, 01/02/2020, 08/30/2020     Objective: Vital Signs: BP 129/86 (BP Location: Left Arm, Patient Position: Sitting, Cuff Size: Normal)   Pulse 78   Resp 14   Ht 5\' 4"  (1.626 m)   Wt 128 lb (58.1 kg)   BMI 21.97 kg/m    Physical Exam Vitals and nursing note reviewed.  Constitutional:      Appearance: She is well-developed.  HENT:     Head: Normocephalic and atraumatic.  Eyes:     Conjunctiva/sclera: Conjunctivae normal.  Cardiovascular:     Rate and Rhythm: Normal rate and regular rhythm.     Heart sounds: Normal heart sounds.  Pulmonary:     Effort: Pulmonary effort is normal.     Breath sounds: Normal breath sounds.  Abdominal:     General: Bowel sounds are normal.     Palpations: Abdomen is soft.  Musculoskeletal:     Cervical back: Normal range of motion.  Lymphadenopathy:     Cervical: No cervical adenopathy.  Skin:    General: Skin is warm and dry.     Capillary Refill: Capillary refill takes less than 2 seconds.  Neurological:     Mental Status: She is alert and oriented to person, place, and time.  Psychiatric:        Behavior: Behavior normal.      Musculoskeletal Exam: Cervical, thoracic and lumbar spine were in good range of motion.  She has some tenderness over the lower lumbar region in the paravertebral area.  No SI joint tenderness were noted.  Shoulder joints, elbow joints, wrist joints, MCPs PIPs and DIPs were in good range of motion.  She had bilateral CMC PIP  and DIP thickening with no synovitis.  Hip joints and knee joints were in good range of motion without any warmth swelling or effusion.  There was no tenderness over ankles or MTPs.  There was no Achilles tendinitis of [place] fasciitis.  CDAI Exam: CDAI Score: -- Patient Global: --; Provider Global: -- Swollen: --; Tender: -- Joint Exam 03/24/2023   No joint exam has been documented for this visit   There is currently no information documented on the homunculus. Go to the Rheumatology activity and complete the homunculus joint exam.  Investigation: No additional findings.  Imaging: No results found.  Recent Labs: Lab Results  Component Value Date   WBC 4.9 01/11/2023   HGB 14.0 01/11/2023   PLT 335 01/11/2023   NA 138 01/11/2023   K 4.1 01/11/2023   CL 101 01/11/2023   CO2 28 01/11/2023   GLUCOSE 91 01/11/2023   BUN 14 01/11/2023   CREATININE 0.81 01/11/2023   BILITOT 0.4 01/11/2023   ALKPHOS 84 10/10/2019   AST 20 01/11/2023   ALT 16 01/11/2023   PROT 7.4 01/11/2023   PROT 7.2 01/11/2023   ALBUMIN 4.6 10/10/2019   CALCIUM 9.7 01/11/2023   GFRAA 84 10/22/2020   QFTBGOLDPLUS NEGATIVE 06/03/2022   January 11, 2023 SPEP normal, TSH normal, vitamin D 45, PTH normal  Speciality Comments: TB gold neg 01/25/18 HIV, Hep, IG, SPEP neg 01/25/18  Procedures:  No procedures performed Allergies: Patient has no known allergies.   Assessment / Plan:     Visit Diagnoses: Inflammatory arthritis-patient has longstanding seronegative inflammatory arthritis iritis.  She had been on sulfasalazine for many years.  She has been taking sulfasalazine 500 mg 1 tablet p.o. twice daily.  She takes Humira 40 mg subcu every other week.  She has intermittent discomfort in her lower back and SI joints.  She had no tenderness on the examination.  She has not had an episode of inflammatory arthritis in a long time.  She denies any flares of iritis.  There was no plantar fasciitis or Achilles  tendinitis.  High risk medication use - Humira 40 mg every 14 days and sulfasalazine 500 mg 2 tablets twice daily. -Labs obtained on January 11, 2023 CBC and CMP were within normal limits.  TB Gold was negative on June 03, 2022.  She was advised to get labs every 3 months and TB Gold annually.  Information on immunization was placed in the 80s.  She was advised to hold Humira and sulfasalazine if she develops an infection resume after the infection resolves.  Annual skin examination to screen for skin cancer was advised.  Use of sunscreen and sun protection was discussed.  Plan: CBC with Differential/Platelet, COMPLETE METABOLIC PANEL WITH GFR, QuantiFERON-TB Gold Plus  History of iritis-she has an appointment coming up with the ophthalmologist in July.  Raynaud's disease without gangrene-currently not active.  Chronic left SI joint pain -she has been going to physical therapy.  Which has been helpful.  She was told that she had leg length discrepancy.  Spondylosis of lumbar region without myelopathy or radiculopathy-she has intermittent discomfort in the lower back.  She complained of left-sided radiculopathy at the last visit which is improved.  Age-related osteoporosis without current pathological fracture - DEXA updated on 02/25/21: [facility] spine BMD 0.771 with T score -2.5.I advised her to get repeat DEXA scan in May or June 2024 through her GYN.  Fibromyalgia-she continues to have some generalized pain and discomfort.  Need for regular exercise and stretching was discussed.  History of hypertension-blood pressure was normal at 129/86 today.  Dyslipidemia  History of macular degeneration  Orders: Orders Placed This Encounter  Procedures   CBC with Differential/Platelet   COMPLETE METABOLIC PANEL WITH GFR   QuantiFERON-TB Gold Plus   No orders of the defined types were placed in this encounter.    Follow-Up Instructions: Return in about 5 months (around 08/24/2023) for Inflammatory  arthritis.   Pollyann Savoy, MD  Note - This record has been created using Animal nutritionist.  Chart creation errors have been sought, but may not always  have been located. Such creation errors do not  reflect on  the standard of medical care.

## 2023-03-10 NOTE — Therapy (Signed)
OUTPATIENT PHYSICAL THERAPY THORACOLUMBAR TREATMENT   Patient Name: Monica [name] MRN: [id number] DOB:February 03, 1956, 67 y.o., female Today's Date: 03/11/2023  END OF SESSION:  PT End of Session - 03/10/23 0916     Visit Number 7    Number of Visits 8    Date for PT Re-Evaluation 03/14/23    Authorization Type [employer] Choice    Authorization Time Period 1 re-eval and 8 visits from 02/14/23 to 03/14/23    Authorization - Visit Number 7    Authorization - Number of Visits 8    Progress Note Due on Visit 8    PT Start Time 0903    PT Stop Time 0943    PT Time Calculation (min) 40 min    Activity Tolerance Patient tolerated treatment well    Behavior During Therapy [facility] for tasks assessed/performed                Past Medical History:  Diagnosis Date   Arthritis    Fibromyalgia    Macular degeneration    Left eye   Osteopenia 05/2018   T score -2.3 FRAX 11% / 1.8% overall stable from prior DEXA   RA (rheumatoid arthritis) (HCC)    Raynaud disease    Reiters syndrome    Sleep disturbance    Vaginal dryness    Past Surgical History:  Procedure Laterality Date   BREAST SURGERY     LEFT BR LUMP-Benign   COLONOSCOPY N/A 08/04/2018   Procedure: COLONOSCOPY;  Surgeon: West Bali, MD;  Location: [facility];  Service: Endoscopy;  Laterality: N/A;  8:30   POLYPECTOMY  08/04/2018   Procedure: POLYPECTOMY;  Surgeon: West Bali, MD;  Location: [facility];  Service: Endoscopy;;   Patient Active Problem List   Diagnosis Date Noted   Special screening for malignant neoplasms, colon    High risk medication use 01/13/2017   History of iritis 01/13/2017   Essential hypertension 01/13/2017   Macular degeneration 01/13/2017   Spondylosis of lumbar region without myelopathy or radiculopathy 01/13/2017   Leg weakness 07/08/2014   Difficulty walking 07/08/2014   Vaginal dryness    Reiter's syndrome (HCC)    Raynaud disease    Fibromyalgia    Inflammatory  arthritis    Osteopenia     PCP: Gareth Morgan, MD  REFERRING PROVIDER: Pollyann Savoy, MD  REFERRING DIAG: M53.3,G89.29 (ICD-10-CM) - Chronic left SI joint pain M47.816 (ICD-10-CM) - Spondylosis of lumbar region without myelopathy or radiculopathy  Rationale for Evaluation and Treatment: Rehabilitation  THERAPY DIAG:  Chronic left SI joint pain  Spondylosis of lumbar region without myelopathy or radiculopathy  ONSET DATE: several years at least back to 2018  SUBJECTIVE:  SUBJECTIVE STATEMENT: Stated she helped a friend with house hold cleaning on Tuesday and reports increased pain.  Reports she had to take 2 Tylenol at 2 am the next morning.  Current pain scale 2/10  PERTINENT HISTORY:  Has been to therapy before; treated for bulging disc and sciatica Raynauds  PAIN:  Are you having pain? 03/10/23: 2/10 Yes: NPRS scale: 1-8/10 Pain location: low back and down left leg Pain description: "bone on bone"ache, burn and stabbing Aggravating factors: worse at night Relieving factors: Tylenol arthritis  PRECAUTIONS: None  WEIGHT BEARING RESTRICTIONS: No  FALLS:  Has patient fallen in last 6 months? No    OCCUPATION: retired  PLOF: Independent  PATIENT GOALS: get out of pain  NEXT MD VISIT: every 5-6 months (next in June)  OBJECTIVE:   DIAGNOSTIC FINDINGS: from 2021   CLINICAL DATA:  Low back pain. Intermittent left-sided sciatica. Severe nocturnal pain.   EXAM: MRI LUMBAR SPINE WITHOUT CONTRAST   TECHNIQUE: Multiplanar, multisequence MR imaging of the lumbar spine was performed. No intravenous contrast was administered.   COMPARISON:  10/26/2016   FINDINGS: Segmentation:  Standard.   Alignment:  Physiologic.   Vertebrae: No fracture, evidence of discitis, or  aggressive bone lesion. T2 hyperintense and T1 intermediate signal bone lesion in the L5 vertebral body unchanged compared with 10/26/2016 with stippled low signal within the lesion likely reflecting an atypical hemangioma.   Conus medullaris and cauda equina: Conus extends to the L1 level. Conus and cauda equina appear normal.   Paraspinal and other soft tissues: No acute paraspinal abnormality. Left parapelvic cysts.   Disc levels:   Disc spaces: Degenerative disease with disc desiccation throughout the lumbar spine with mild disc height loss at L1-2. reactive endplate changes at L5-S1.   T12-L1: No significant disc bulge. No evidence of neural foraminal stenosis. No central canal stenosis.   L1-L2: Mild broad-based disc bulge. No evidence of neural foraminal stenosis. No central canal stenosis.   L2-L3: Mild broad-based disc bulge. No evidence of neural foraminal stenosis. No central canal stenosis.   L3-L4: Mild broad-based disc bulge. No foraminal stenosis. No central canal stenosis.   L4-L5: Broad-based disc bulge with a small right foraminal disc protrusion. Mild right foraminal stenosis. No left foraminal stenosis. No central canal stenosis.   L5-S1: Mild broad-based disc bulge with a small left foraminal disc protrusion. Mild bilateral facet arthropathy. Mild left foraminal stenosis. No right foraminal stenosis. No central canal stenosis.   IMPRESSION: 1. At L4-5 there is a broad-based disc bulge with a small right foraminal disc protrusion. Mild right foraminal stenosis. 2. At L5-S1 there is a mild broad-based disc bulge with a small left foraminal disc protrusion. Mild bilateral facet arthropathy. Mild left foraminal stenosis.     Electronically Signed   By: Elige Ko   On: 03/08/2020 09:47  PATIENT SURVEYS:  FOTO 58    COGNITION: Overall cognitive status: Within functional limits for tasks assessed     SENSATION: Occasional numbness in feet  and hands  POSTURE: rounded shoulders, forward head, and weight shift right  PALPATION: Tender lumbar spine paraspinals; PSIS  LUMBAR ROM:   AROM eval 03/10/23  Flexion 70%  pain low back left and some on right 90% pain free  Extension 60% available felt better with repeated movements WNL  Right lateral flexion    Left lateral flexion    Right rotation    Left rotation     (Blank rows = not tested)  LOWER EXTREMITY  ROM:     Active  Right eval Left eval  Hip flexion    Hip extension    Hip abduction    Hip adduction    Hip internal rotation    Hip external rotation    Knee flexion    Knee extension    Ankle dorsiflexion    Ankle plantarflexion    Ankle inversion    Ankle eversion     (Blank rows = not tested)  LOWER EXTREMITY MMT:    MMT Right eval Left eval Right 03/10/23 Left 03/10/23  Hip flexion 4+ 4- 5/5 4+  Hip extension 4+ 4 4+ 4/5  Hip abduction   4+ 4+  Hip adduction      Hip internal rotation      Hip external rotation      Knee flexion 5 4- 5/5 4/5  Knee extension 5 4 5/5 4+/5  Ankle dorsiflexion 5 4+ 5/5 4+  Ankle plantarflexion      Ankle inversion      Ankle eversion       (Blank rows = not tested)   FUNCTIONAL TESTS:  5 times sit to stand: 15.46 sec 03/10/23: 5 STS 12.81"  03/10/23: 576ft no AD  TODAY'S TREATMENT:                                                                                                                              DATE:  03/10/23: Reviewed goals- reports 20% improvements FOTO 58%. MMT ROM 5STS 12.81" 542ft no AD Squat 2x 10 Vector stance 3x 5"  03/03/23 Prone: Heel squeeze with pillow under hips 10 x 5" Hip extension 10x   Quadruped: UE 5x 5" LE 5x 5"  Sidelying: abduction 10x  Supine:   Bridge with belt hip abduction 2x 10 5" holds Isometric hip flexion with theraball 10x 5" holds each  Oblique with theraball  Discussed benefits with seated posture on  theraball 13-Mar-2023: Supine: Pelvic tilt Dead bug with ab set Bridge with belt hip abduction 2x 10 5" holds Decompression with green theraband 10x  Sidelying: clam with GTB 10x 5"  02/24/23 Supine: Bridge x 10 Bridge with ball hip adduction x 10 Bridge with belt hip abduction x 10 Abdominal bracing 5" x 10 Active hamstring stretch with ankle AROM DF/PF nerve floss 2 x 20" each Decompression exercises: Leg lengthener 5" hold x 10 each Leg press 5" hold x 10 Shoulder press 5" hold x 8    02/22/23 Supine:  Abdominal bracing 5" x 10 Pubic clearing Hooklying isometric add against ball then abd in belt 10x 5" holds Bridge 10x while pushing thighs into belt Active hamstring stretch with ankle AROM DF/PF nerve floss 3 x 20" each Checked SI alignment Marching with ab set 10x 5" SKTC 3x 30"  02/16/23 Review of HEP and goals Prone lying x 1' (increased tingling in left foot so d/c)  Supine: Diaphragmatic breathing x 1' SKTC 20" x  5 each Active hamstring stretch with ankle AROM DF/PF nerve floss 3 x 20" each Abdominal bracing 5" x 10 LTR x 10 DKTC x 5     02/14/23 Physical therapy evaluation and HEP instruction    PATIENT EDUCATION:  Education details: Patient educated on exam findings, POC, scope of PT, HEP, and what to expect next visit. Person educated: Patient Education method: Explanation, Demonstration, and Handouts Education comprehension: verbalized understanding, returned demonstration, verbal cues required, and tactile cues required  HOME EXERCISE PROGRAM: 03/02/20: sidelying abd 03/01/23: UE decompression exercise with UE, given GTB 02/24/23 decompression exercises leg press, leg lengthener and shoulder press Access Code: [email or web address] URL: [email or web address] 02/22/23 - Hooklying Isometric Hip Abduction Adduction with Belt and Ball  - 1 x daily - 7 x weekly - 3 sets - 10 reps - 5" hold - Supine Bridge  - 1 x daily - 7 x weekly - 3 sets - 10 reps - 5"  hold Date: 02/16/2023 Prepared by: [facility]  Exercises - Supine Transversus Abdominis Bracing - Hands on Stomach  - 2 x daily - 7 x weekly - 1 sets - 10 reps - 5 sec hold - Supine Gluteal Sets  - 2 x daily - 7 x weekly - 3 sets - 10 reps - 5 sec hold - Hooklying Single Knee to Chest  - 2 x daily - 7 x weekly - 1 sets - 3 reps - 20 sec hold - Supine Lower Trunk Rotation  - 2 x daily - 7 x weekly - 1 sets - 10 reps - 2" hold - Supine Sciatic Nerve Glide  - 1 x daily - 7 x weekly - 1 sets - 3 reps - 20"  hold  Access Code: [email or web address] URL: [email or web address] Date: 02/14/2023 Prepared by: [facility]  Exercises - Prone Gluteal Sets  - 2 x daily - 7 x weekly - 1 sets - 10 reps - 5 sec hold - Supine Transversus Abdominis Bracing - Hands on Stomach  - 1 x daily - 7 x weekly - 1 sets - 10 reps - 5 sec hold  ASSESSMENT:  CLINICAL IMPRESSION: Reviewed goals with the following subjective and objective findings:  Pt compliant with HEP and reports of 20% improvements.  Reports she had ability to go to sleep without needing Tylenol though does report increased pain following household chores and continues to have intermittent radicular symptoms front of shin.  Pt presents with improvements with 5STS, ROM and strength though continues to demonstrate weakness in gluteal mm.  Pt will benefits from continued therapy to address weakness and learn body mechanics to assist with household chores for pain control and to reduce injury in the future.   Eval:Patient is a 67 y.o. female who was seen today for physical therapy evaluation and treatment for low back pain and down into left leg. Patient demonstrates muscle weakness, reduced ROM, and fascial restrictions which are likely contributing to symptoms of pain and are negatively impacting patient ability to perform ADLs and functional mobility tasks. Patient will benefit from skilled physical therapy services to address these deficits to reduce  pain and improve level of function with ADLs and functional mobility tasks.   OBJECTIVE IMPAIRMENTS: decreased activity tolerance, decreased endurance, decreased knowledge of condition, decreased mobility, difficulty walking, decreased ROM, decreased strength, hypomobility, increased fascial restrictions, impaired perceived functional ability, increased muscle spasms, impaired flexibility, and pain.   ACTIVITY LIMITATIONS: carrying, lifting, bending, sitting, standing, squatting, sleeping, stairs, transfers, locomotion level, and caring  for others  PARTICIPATION LIMITATIONS: meal prep, cleaning, laundry, medication management, community activity, and yard work  [facility] POTENTIAL: Good  CLINICAL DECISION MAKING: Stable/uncomplicated  EVALUATION COMPLEXITY: Low   GOALS: Goals reviewed with patient? No  SHORT TERM GOALS: Target date: 02/28/2023  patient will be independent with initial HEP Baseline: 03/10/23:  Reports compliance with HEP daily Goal status: MET  2.  Patient will self report 30% improvement to improve tolerance for functional activity   Baseline: 03/10/23:  20% improvements, reports she doesn't need to take Tylenol before bed at night. Goal status: IN PROGRESS   LONG TERM GOALS: Target date: 03/14/2023  Patient will be independent in self management strategies to improve quality of life and functional outcomes. Baseline:  Goal status: IN PROGRESS  2.  Patient will self report 50% improvement to improve tolerance for functional activity   Baseline: 03/10/23:  20% improvements Goal status: IN PROGRESS  3.  Patient will improve FOTO score by 10 points  Baseline: 58; 03/10/23:  58% Goal status: IN PROGRESS  4.  Patient will increase left leg MMTs to 4+ to 5/5 without pain to promote return to ambulation community distances with minimal deviation.  Baseline: see above Goal status: IN PROGRESS  5.  Patient will improve 5 times sit to stand score from 15.46 sec  sec to 12 sec to demonstrate improved functional mobility and increased lower extremity strength.  Baseline: 03/10/23: 5 STS 12.81"  Goal status: IN PROGRESS  6.  Patient will stand and walk x 30 min without radiating left leg symptoms past her knee to improve ability to perform yard and house work efficiently Baseline: 03/10/23:  Increased pain while standing, able to stand/walk 30-40 minutes with throbbing pain on shin. Goal status: IN PROGRESS  PLAN:  PT FREQUENCY: 2x/week  PT DURATION: 4 weeks  PLANNED INTERVENTIONS: Therapeutic exercises, Therapeutic activity, Neuromuscular re-education, Balance training, Gait training, Patient/Family education, Joint manipulation, Joint mobilization, Stair training, Orthotic/Fit training, DME instructions, Aquatic Therapy, Dry Needling, Electrical stimulation, Spinal manipulation, Spinal mobilization, Cryotherapy, Moist heat, Compression bandaging, scar mobilization, Splintting, Taping, Traction, Ultrasound, Ionotophoresis 4mg /ml Dexamethasone, and Manual therapy .  PLAN FOR NEXT SESSION:  Continue 2 additional weeks.  centralize symptoms; core strengthening. Focus on body mechanics with squats, lunges, lifting.  Becky Sax, LPTA/CLT; CBIS [phone]  10:06 AM, 03/11/23

## 2023-03-15 ENCOUNTER — Ambulatory Visit (HOSPITAL_COMMUNITY): Payer: Medicare PPO | Attending: Rheumatology

## 2023-03-15 DIAGNOSIS — Z79899 Other long term (current) drug therapy: Secondary | ICD-10-CM | POA: Insufficient documentation

## 2023-03-15 DIAGNOSIS — Z8679 Personal history of other diseases of the circulatory system: Secondary | ICD-10-CM | POA: Diagnosis present

## 2023-03-15 DIAGNOSIS — I7300 Raynaud's syndrome without gangrene: Secondary | ICD-10-CM | POA: Diagnosis present

## 2023-03-15 DIAGNOSIS — Z8669 Personal history of other diseases of the nervous system and sense organs: Secondary | ICD-10-CM | POA: Insufficient documentation

## 2023-03-15 DIAGNOSIS — E785 Hyperlipidemia, unspecified: Secondary | ICD-10-CM | POA: Insufficient documentation

## 2023-03-15 DIAGNOSIS — G8929 Other chronic pain: Secondary | ICD-10-CM | POA: Diagnosis present

## 2023-03-15 DIAGNOSIS — M47816 Spondylosis without myelopathy or radiculopathy, lumbar region: Secondary | ICD-10-CM | POA: Insufficient documentation

## 2023-03-15 DIAGNOSIS — M533 Sacrococcygeal disorders, not elsewhere classified: Secondary | ICD-10-CM | POA: Insufficient documentation

## 2023-03-15 DIAGNOSIS — M797 Fibromyalgia: Secondary | ICD-10-CM | POA: Diagnosis present

## 2023-03-15 DIAGNOSIS — M810 Age-related osteoporosis without current pathological fracture: Secondary | ICD-10-CM | POA: Insufficient documentation

## 2023-03-15 DIAGNOSIS — M1990 Unspecified osteoarthritis, unspecified site: Secondary | ICD-10-CM | POA: Insufficient documentation

## 2023-03-15 NOTE — Therapy (Signed)
OUTPATIENT PHYSICAL THERAPY THORACOLUMBAR TREATMENT   Patient Name: Monica [name] MRN: [id number] DOB:02-Jun-1956, 67 y.o., female Today's Date: 03/15/2023  END OF SESSION:  PT End of Session - 03/15/23 0951     Visit Number 8    Number of Visits 8    Date for PT Re-Evaluation 03/14/23    Authorization Type [employer] Choice    Authorization Time Period 1 re-eval and 8 visits from 02/14/23 to 03/14/23    Authorization - Visit Number 8    Authorization - Number of Visits 8    Progress Note Due on Visit 8    Activity Tolerance Patient tolerated treatment well    Behavior During Therapy [facility] for tasks assessed/performed                Past Medical History:  Diagnosis Date   Arthritis    Fibromyalgia    Macular degeneration    Left eye   Osteopenia 05/2018   T score -2.3 FRAX 11% / 1.8% overall stable from prior DEXA   RA (rheumatoid arthritis) (HCC)    Raynaud disease    Reiters syndrome    Sleep disturbance    Vaginal dryness    Past Surgical History:  Procedure Laterality Date   BREAST SURGERY     LEFT BR LUMP-Benign   COLONOSCOPY N/A 08/04/2018   Procedure: COLONOSCOPY;  Surgeon: West Bali, MD;  Location: [facility];  Service: Endoscopy;  Laterality: N/A;  8:30   POLYPECTOMY  08/04/2018   Procedure: POLYPECTOMY;  Surgeon: West Bali, MD;  Location: [facility];  Service: Endoscopy;;   Patient Active Problem List   Diagnosis Date Noted   Special screening for malignant neoplasms, colon    High risk medication use 01/13/2017   History of iritis 01/13/2017   Essential hypertension 01/13/2017   Macular degeneration 01/13/2017   Spondylosis of lumbar region without myelopathy or radiculopathy 01/13/2017   Leg weakness 07/08/2014   Difficulty walking 07/08/2014   Vaginal dryness    Reiter's syndrome (HCC)    Raynaud disease    Fibromyalgia    Inflammatory arthritis    Osteopenia     PCP: Gareth Morgan, MD  REFERRING PROVIDER:  Pollyann Savoy, MD  REFERRING DIAG: M53.3,G89.29 (ICD-10-CM) - Chronic left SI joint pain M47.816 (ICD-10-CM) - Spondylosis of lumbar region without myelopathy or radiculopathy  Rationale for Evaluation and Treatment: Rehabilitation  THERAPY DIAG:  Chronic left SI joint pain  Spondylosis of lumbar region without myelopathy or radiculopathy  ONSET DATE: several years at least back to 2018  SUBJECTIVE:  SUBJECTIVE STATEMENT: Last 2 nights did not sleep well had to take Tylenol to sleep  PERTINENT HISTORY:  Has been to therapy before; treated for bulging disc and sciatica Raynauds  PAIN:  Are you having pain? 03/10/23: 2/10 Yes: NPRS scale: 1-8/10 Pain location: low back and down left leg Pain description: "bone on bone"ache, burn and stabbing Aggravating factors: worse at night Relieving factors: Tylenol arthritis  PRECAUTIONS: None  WEIGHT BEARING RESTRICTIONS: No  FALLS:  Has patient fallen in last 6 months? No    OCCUPATION: retired  PLOF: Independent  PATIENT GOALS: get out of pain  NEXT MD VISIT: every 5-6 months (next in June)  OBJECTIVE:   DIAGNOSTIC FINDINGS: from 2021   CLINICAL DATA:  Low back pain. Intermittent left-sided sciatica. Severe nocturnal pain.   EXAM: MRI LUMBAR SPINE WITHOUT CONTRAST   TECHNIQUE: Multiplanar, multisequence MR imaging of the lumbar spine was performed. No intravenous contrast was administered.   COMPARISON:  10/26/2016   FINDINGS: Segmentation:  Standard.   Alignment:  Physiologic.   Vertebrae: No fracture, evidence of discitis, or aggressive bone lesion. T2 hyperintense and T1 intermediate signal bone lesion in the L5 vertebral body unchanged compared with 10/26/2016 with stippled low signal within the lesion likely  reflecting an atypical hemangioma.   Conus medullaris and cauda equina: Conus extends to the L1 level. Conus and cauda equina appear normal.   Paraspinal and other soft tissues: No acute paraspinal abnormality. Left parapelvic cysts.   Disc levels:   Disc spaces: Degenerative disease with disc desiccation throughout the lumbar spine with mild disc height loss at L1-2. reactive endplate changes at L5-S1.   T12-L1: No significant disc bulge. No evidence of neural foraminal stenosis. No central canal stenosis.   L1-L2: Mild broad-based disc bulge. No evidence of neural foraminal stenosis. No central canal stenosis.   L2-L3: Mild broad-based disc bulge. No evidence of neural foraminal stenosis. No central canal stenosis.   L3-L4: Mild broad-based disc bulge. No foraminal stenosis. No central canal stenosis.   L4-L5: Broad-based disc bulge with a small right foraminal disc protrusion. Mild right foraminal stenosis. No left foraminal stenosis. No central canal stenosis.   L5-S1: Mild broad-based disc bulge with a small left foraminal disc protrusion. Mild bilateral facet arthropathy. Mild left foraminal stenosis. No right foraminal stenosis. No central canal stenosis.   IMPRESSION: 1. At L4-5 there is a broad-based disc bulge with a small right foraminal disc protrusion. Mild right foraminal stenosis. 2. At L5-S1 there is a mild broad-based disc bulge with a small left foraminal disc protrusion. Mild bilateral facet arthropathy. Mild left foraminal stenosis.     Electronically Signed   By: Elige Ko   On: 03/08/2020 09:47  PATIENT SURVEYS:  FOTO 58    COGNITION: Overall cognitive status: Within functional limits for tasks assessed     SENSATION: Occasional numbness in feet and hands  POSTURE: rounded shoulders, forward head, and weight shift right  PALPATION: Tender lumbar spine paraspinals; PSIS  LUMBAR ROM:   AROM eval 03/10/23  Flexion 70%  pain low  back left and some on right 90% pain free  Extension 60% available felt better with repeated movements WNL  Right lateral flexion    Left lateral flexion    Right rotation    Left rotation     (Blank rows = not tested)  LOWER EXTREMITY ROM:     Active  Right eval Left eval  Hip flexion    Hip extension  Hip abduction    Hip adduction    Hip internal rotation    Hip external rotation    Knee flexion    Knee extension    Ankle dorsiflexion    Ankle plantarflexion    Ankle inversion    Ankle eversion     (Blank rows = not tested)  LOWER EXTREMITY MMT:    MMT Right eval Left eval Right 03/10/23 Left 03/10/23  Hip flexion 4+ 4- 5/5 4+  Hip extension 4+ 4 4+ 4/5  Hip abduction   4+ 4+  Hip adduction      Hip internal rotation      Hip external rotation      Knee flexion 5 4- 5/5 4/5  Knee extension 5 4 5/5 4+/5  Ankle dorsiflexion 5 4+ 5/5 4+  Ankle plantarflexion      Ankle inversion      Ankle eversion       (Blank rows = not tested)   FUNCTIONAL TESTS:  5 times sit to stand: 15.46 sec 03/10/23: 5 STS 12.81"  03/10/23: 542ft no AD  TODAY'S TREATMENT:                                                                                                                              DATE: 03/15/23 Nustep seat 8 x 5' level 2 dynamic warm up  Hip abduction BTB 2 x 10 each Hip extension BTB 2 x 10 each Sit to stand BTB 2 x 10 each   03/10/23: Reviewed goals- reports 20% improvements FOTO 58%. MMT ROM 5STS 12.81" 567ft no AD Squat 2x 10 Vector stance 3x 5"  03/03/23 Prone: Heel squeeze with pillow under hips 10 x 5" Hip extension 10x   Quadruped: UE 5x 5" LE 5x 5"  Sidelying: abduction 10x  Supine:   Bridge with belt hip abduction 2x 10 5" holds Isometric hip flexion with theraball 10x 5" holds each  Oblique with theraball  Discussed benefits with seated posture on theraball Mar 28, 2023: Supine: Pelvic tilt Dead bug with ab set Bridge with  belt hip abduction 2x 10 5" holds Decompression with green theraband 10x  Sidelying: clam with GTB 10x 5"  02/24/23 Supine: Bridge x 10 Bridge with ball hip adduction x 10 Bridge with belt hip abduction x 10 Abdominal bracing 5" x 10 Active hamstring stretch with ankle AROM DF/PF nerve floss 2 x 20" each Decompression exercises: Leg lengthener 5" hold x 10 each Leg press 5" hold x 10 Shoulder press 5" hold x 8    02/22/23 Supine:  Abdominal bracing 5" x 10 Pubic clearing Hooklying isometric add against ball then abd in belt 10x 5" holds Bridge 10x while pushing thighs into belt Active hamstring stretch with ankle AROM DF/PF nerve floss 3 x 20" each Checked SI alignment Marching with ab set 10x 5" SKTC 3x 30"  02/16/23 Review of HEP and goals Prone lying x 1' (increased tingling in  left foot so d/c)  Supine: Diaphragmatic breathing x 1' SKTC 20" x 5 each Active hamstring stretch with ankle AROM DF/PF nerve floss 3 x 20" each Abdominal bracing 5" x 10 LTR x 10 DKTC x 5     02/14/23 Physical therapy evaluation and HEP instruction    PATIENT EDUCATION:  Education details: Patient educated on exam findings, POC, scope of PT, HEP, and what to expect next visit. Person educated: Patient Education method: Explanation, Demonstration, and Handouts Education comprehension: verbalized understanding, returned demonstration, verbal cues required, and tactile cues required  HOME EXERCISE PROGRAM: 03/02/20: sidelying abd 03/01/23: UE decompression exercise with UE, given GTB 02/24/23 decompression exercises leg press, leg lengthener and shoulder press Access Code: [email or web address] URL: [email or web address] 02/22/23 - Hooklying Isometric Hip Abduction Adduction with Belt and Ball  - 1 x daily - 7 x weekly - 3 sets - 10 reps - 5" hold - Supine Bridge  - 1 x daily - 7 x weekly - 3 sets - 10 reps - 5" hold Date: 02/16/2023 Prepared by: [facility]  Exercises - Supine  Transversus Abdominis Bracing - Hands on Stomach  - 2 x daily - 7 x weekly - 1 sets - 10 reps - 5 sec hold - Supine Gluteal Sets  - 2 x daily - 7 x weekly - 3 sets - 10 reps - 5 sec hold - Hooklying Single Knee to Chest  - 2 x daily - 7 x weekly - 1 sets - 3 reps - 20 sec hold - Supine Lower Trunk Rotation  - 2 x daily - 7 x weekly - 1 sets - 10 reps - 2" hold - Supine Sciatic Nerve Glide  - 1 x daily - 7 x weekly - 1 sets - 3 reps - 20"  hold  Access Code: [email or web address] URL: [email or web address] Date: 02/14/2023 Prepared by: [facility]  Exercises - Prone Gluteal Sets  - 2 x daily - 7 x weekly - 1 sets - 10 reps - 5 sec hold - Supine Transversus Abdominis Bracing - Hands on Stomach  - 1 x daily - 7 x weekly - 1 sets - 10 reps - 5 sec hold  ASSESSMENT:  CLINICAL IMPRESSION: Today we worked on more standing resisted exercise with focus on core and hip and glute strengthening to begin to work towards a good home program.  Patient with good challenge with standing exercises with BTB. Updated HEP.   Pt will benefits from continued therapy to address weakness and learn body mechanics to assist with household chores for pain control and to reduce injury in the future.   Eval:Patient is a 67 y.o. female who was seen today for physical therapy evaluation and treatment for low back pain and down into left leg. Patient demonstrates muscle weakness, reduced ROM, and fascial restrictions which are likely contributing to symptoms of pain and are negatively impacting patient ability to perform ADLs and functional mobility tasks. Patient will benefit from skilled physical therapy services to address these deficits to reduce pain and improve level of function with ADLs and functional mobility tasks.   OBJECTIVE IMPAIRMENTS: decreased activity tolerance, decreased endurance, decreased knowledge of condition, decreased mobility, difficulty walking, decreased ROM, decreased strength, hypomobility,  increased fascial restrictions, impaired perceived functional ability, increased muscle spasms, impaired flexibility, and pain.   ACTIVITY LIMITATIONS: carrying, lifting, bending, sitting, standing, squatting, sleeping, stairs, transfers, locomotion level, and caring for others  PARTICIPATION LIMITATIONS: meal prep, cleaning, laundry, medication management, community activity, and yard  work  [facility] POTENTIAL: Good  CLINICAL DECISION MAKING: Stable/uncomplicated  EVALUATION COMPLEXITY: Low   GOALS: Goals reviewed with patient? No  SHORT TERM GOALS: Target date: 02/28/2023  patient will be independent with initial HEP Baseline: 03/10/23:  Reports compliance with HEP daily Goal status: MET  2.  Patient will self report 30% improvement to improve tolerance for functional activity   Baseline: 03/10/23:  20% improvements, reports she doesn't need to take Tylenol before bed at night. Goal status: IN PROGRESS   LONG TERM GOALS: Target date: 03/14/2023  Patient will be independent in self management strategies to improve quality of life and functional outcomes. Baseline:  Goal status: IN PROGRESS  2.  Patient will self report 50% improvement to improve tolerance for functional activity   Baseline: 03/10/23:  20% improvements Goal status: IN PROGRESS  3.  Patient will improve FOTO score by 10 points  Baseline: 58; 03/10/23:  58% Goal status: IN PROGRESS  4.  Patient will increase left leg MMTs to 4+ to 5/5 without pain to promote return to ambulation community distances with minimal deviation.  Baseline: see above Goal status: IN PROGRESS  5.  Patient will improve 5 times sit to stand score from 15.46 sec sec to 12 sec to demonstrate improved functional mobility and increased lower extremity strength.  Baseline: 03/10/23: 5 STS 12.81"  Goal status: IN PROGRESS  6.  Patient will stand and walk x 30 min without radiating left leg symptoms past her knee to improve ability to  perform yard and house work efficiently Baseline: 03/10/23:  Increased pain while standing, able to stand/walk 30-40 minutes with throbbing pain on shin. Goal status: IN PROGRESS  PLAN:  PT FREQUENCY: 2x/week  PT DURATION: 4 weeks  PLANNED INTERVENTIONS: Therapeutic exercises, Therapeutic activity, Neuromuscular re-education, Balance training, Gait training, Patient/Family education, Joint manipulation, Joint mobilization, Stair training, Orthotic/Fit training, DME instructions, Aquatic Therapy, Dry Needling, Electrical stimulation, Spinal manipulation, Spinal mobilization, Cryotherapy, Moist heat, Compression bandaging, scar mobilization, Splintting, Taping, Traction, Ultrasound, Ionotophoresis 4mg /ml Dexamethasone, and Manual therapy .  PLAN FOR NEXT SESSION:  assess for possible discharge next visit?   10:05 AM, 03/15/23 Morty Ortwein Small Babacar [place] MPT [place] [facility] [place] [phone]

## 2023-03-15 NOTE — Addendum Note (Signed)
Addended byWilhemena Durie S on: 03/15/2023 09:33 AM   Modules accepted: Orders

## 2023-03-17 ENCOUNTER — Ambulatory Visit (HOSPITAL_COMMUNITY): Payer: Medicare PPO

## 2023-03-17 DIAGNOSIS — M47816 Spondylosis without myelopathy or radiculopathy, lumbar region: Secondary | ICD-10-CM

## 2023-03-17 DIAGNOSIS — M533 Sacrococcygeal disorders, not elsewhere classified: Secondary | ICD-10-CM | POA: Diagnosis not present

## 2023-03-17 DIAGNOSIS — G8929 Other chronic pain: Secondary | ICD-10-CM

## 2023-03-17 NOTE — Therapy (Signed)
OUTPATIENT PHYSICAL THERAPY THORACOLUMBAR TREATMENT PHYSICAL THERAPY DISCHARGE SUMMARY  Visits from Start of Care: 9  Current functional level related to goals / functional outcomes: See below   Remaining deficits: See below   Education / Equipment: See below   Patient agrees to discharge. Patient goals were partially met. Patient is being discharged due to being pleased with the current functional level. Patient going out of town for couple weeks for vacation and will continue with HEP; will contact MD with further needs.    Patient Name: Monica [name] MRN: [id number] DOB:1956-08-01, 67 y.o., female Today's Date: 03/17/2023  END OF SESSION:  PT End of Session - 03/17/23 0909     Visit Number 9    Number of Visits 16    Date for PT Re-Evaluation 04/05/23    Authorization Type [employer] Choice    Authorization Time Period 1 re-eval and 8 visits from 6/4 to 6/25    Authorization - Visit Number 9    Authorization - Number of Visits 16    Progress Note Due on Visit 8    PT Start Time 0905    PT Stop Time 0945    PT Time Calculation (min) 40 min    Activity Tolerance Patient tolerated treatment well    Behavior During Therapy [facility] for tasks assessed/performed                Past Medical History:  Diagnosis Date   Arthritis    Fibromyalgia    Macular degeneration    Left eye   Osteopenia 05/2018   T score -2.3 FRAX 11% / 1.8% overall stable from prior DEXA   RA (rheumatoid arthritis) (HCC)    Raynaud disease    Reiters syndrome    Sleep disturbance    Vaginal dryness    Past Surgical History:  Procedure Laterality Date   BREAST SURGERY     LEFT BR LUMP-Benign   COLONOSCOPY N/A 08/04/2018   Procedure: COLONOSCOPY;  Surgeon: West Bali, MD;  [facility]: [facility];  Service: Endoscopy;  Laterality: N/A;  8:30   POLYPECTOMY  08/04/2018   Procedure: POLYPECTOMY;  Surgeon: West Bali, MD;  Location: [facility];  Service: Endoscopy;;    Patient Active Problem List   Diagnosis Date Noted   Special screening for malignant neoplasms, colon    High risk medication use 01/13/2017   History of iritis 01/13/2017   Essential hypertension 01/13/2017   Macular degeneration 01/13/2017   Spondylosis of lumbar region without myelopathy or radiculopathy 01/13/2017   Leg weakness 07/08/2014   Difficulty walking 07/08/2014   Vaginal dryness    Reiter's syndrome (HCC)    Raynaud disease    Fibromyalgia    Inflammatory arthritis    Osteopenia     PCP: Gareth Morgan, MD  REFERRING PROVIDER: Pollyann Savoy, MD  REFERRING DIAG: M53.3,G89.29 (ICD-10-CM) - Chronic left SI joint pain M47.816 (ICD-10-CM) - Spondylosis of lumbar region without myelopathy or radiculopathy  Rationale for Evaluation and Treatment: Rehabilitation  THERAPY DIAG:  Chronic left SI joint pain  Spondylosis of lumbar region without myelopathy or radiculopathy  ONSET DATE: several years at least back to 2018  SUBJECTIVE:  SUBJECTIVE STATEMENT: Feels pretty good about exercises overall; does get some numbness in feet with nerve flossing in supine; able to walk about 45 min to 1 hour before pain starts into left shin to stop her  PERTINENT HISTORY:  Has been to therapy before; treated for bulging disc and sciatica Raynauds  PAIN:  Are you having pain? 03/10/23: 2/10 Yes: NPRS scale: 1-8/10 Pain location: low back and down left leg Pain description: "bone on bone"ache, burn and stabbing Aggravating factors: worse at night Relieving factors: Tylenol arthritis  PRECAUTIONS: None  WEIGHT BEARING RESTRICTIONS: No  FALLS:  Has patient fallen in last 6 months? No    OCCUPATION: retired  PLOF: Independent  PATIENT GOALS: get out of pain  NEXT MD VISIT: every  5-6 months (next in June)  OBJECTIVE:   DIAGNOSTIC FINDINGS: from 2021   CLINICAL DATA:  Low back pain. Intermittent left-sided sciatica. Severe nocturnal pain.   EXAM: MRI LUMBAR SPINE WITHOUT CONTRAST   TECHNIQUE: Multiplanar, multisequence MR imaging of the lumbar spine was performed. No intravenous contrast was administered.   COMPARISON:  10/26/2016   FINDINGS: Segmentation:  Standard.   Alignment:  Physiologic.   Vertebrae: No fracture, evidence of discitis, or aggressive bone lesion. T2 hyperintense and T1 intermediate signal bone lesion in the L5 vertebral body unchanged compared with 10/26/2016 with stippled low signal within the lesion likely reflecting an atypical hemangioma.   Conus medullaris and cauda equina: Conus extends to the L1 level. Conus and cauda equina appear normal.   Paraspinal and other soft tissues: No acute paraspinal abnormality. Left parapelvic cysts.   Disc levels:   Disc spaces: Degenerative disease with disc desiccation throughout the lumbar spine with mild disc height loss at L1-2. reactive endplate changes at L5-S1.   T12-L1: No significant disc bulge. No evidence of neural foraminal stenosis. No central canal stenosis.   L1-L2: Mild broad-based disc bulge. No evidence of neural foraminal stenosis. No central canal stenosis.   L2-L3: Mild broad-based disc bulge. No evidence of neural foraminal stenosis. No central canal stenosis.   L3-L4: Mild broad-based disc bulge. No foraminal stenosis. No central canal stenosis.   L4-L5: Broad-based disc bulge with a small right foraminal disc protrusion. Mild right foraminal stenosis. No left foraminal stenosis. No central canal stenosis.   L5-S1: Mild broad-based disc bulge with a small left foraminal disc protrusion. Mild bilateral facet arthropathy. Mild left foraminal stenosis. No right foraminal stenosis. No central canal stenosis.   IMPRESSION: 1. At L4-5 there is a  broad-based disc bulge with a small right foraminal disc protrusion. Mild right foraminal stenosis. 2. At L5-S1 there is a mild broad-based disc bulge with a small left foraminal disc protrusion. Mild bilateral facet arthropathy. Mild left foraminal stenosis.     Electronically Signed   By: Elige Ko   On: 03/08/2020 09:47  PATIENT SURVEYS:  FOTO 58    COGNITION: Overall cognitive status: Within functional limits for tasks assessed     SENSATION: Occasional numbness in feet and hands  POSTURE: rounded shoulders, forward head, and weight shift right  PALPATION: Tender lumbar spine paraspinals; PSIS  LUMBAR ROM:   AROM eval 03/10/23  Flexion 70%  pain low back left and some on right 90% pain free  Extension 60% available felt better with repeated movements WNL  Right lateral flexion    Left lateral flexion    Right rotation    Left rotation     (Blank rows = not tested)  LOWER  EXTREMITY ROM:     Active  Right eval Left eval  Hip flexion    Hip extension    Hip abduction    Hip adduction    Hip internal rotation    Hip external rotation    Knee flexion    Knee extension    Ankle dorsiflexion    Ankle plantarflexion    Ankle inversion    Ankle eversion     (Blank rows = not tested)  LOWER EXTREMITY MMT:    MMT Right eval Left eval Right 03/10/23 Left 03/10/23 Left 03/17/23  Hip flexion 4+ 4- 5/5 4+ 4+  Hip extension 4+ 4 4+ 4/5   Hip abduction   4+ 4+   Hip adduction       Hip internal rotation       Hip external rotation       Knee flexion 5 4- 5/5 4/5 4+  Knee extension 5 4 5/5 4+/5 5  Ankle dorsiflexion 5 4+ 5/5 4+ 4+  Ankle plantarflexion       Ankle inversion       Ankle eversion        (Blank rows = not tested)   FUNCTIONAL TESTS:  5 times sit to stand: 15.46 sec 03/10/23: 5 STS 12.81"  03/10/23: 571ft no AD  TODAY'S TREATMENT:                                                                                                                               DATE: 03/17/23 Nerve flossing in sitting Standing SI self mob; foot on stool pushing buttocks towards ankle Supine left single knee to chest with oscillation 10 x 10" 5 x sit to stand test 10.85 sec goal met MMT's see above   03/15/23 Nustep seat 8 x 5' level 2 dynamic warm up  Hip abduction BTB 2 x 10 each Hip extension BTB 2 x 10 each Sit to stand BTB 2 x 10 each   03/10/23: Reviewed goals- reports 20% improvements FOTO 58%. MMT ROM 5STS 12.81" 56ft no AD Squat 2x 10 Vector stance 3x 5"  03/03/23 Prone: Heel squeeze with pillow under hips 10 x 5" Hip extension 10x   Quadruped: UE 5x 5" LE 5x 5"  Sidelying: abduction 10x  Supine:   Bridge with belt hip abduction 2x 10 5" holds Isometric hip flexion with theraball 10x 5" holds each  Oblique with theraball  Discussed benefits with seated posture on theraball 03-06-2023: Supine: Pelvic tilt Dead bug with ab set Bridge with belt hip abduction 2x 10 5" holds Decompression with green theraband 10x  Sidelying: clam with GTB 10x 5"  02/24/23 Supine: Bridge x 10 Bridge with ball hip adduction x 10 Bridge with belt hip abduction x 10 Abdominal bracing 5" x 10 Active hamstring stretch with ankle AROM DF/PF nerve floss 2 x 20" each Decompression exercises: Leg lengthener 5" hold x 10 each Leg press 5" hold  x 10 Shoulder press 5" hold x 8    02/22/23 Supine:  Abdominal bracing 5" x 10 Pubic clearing Hooklying isometric add against ball then abd in belt 10x 5" holds Bridge 10x while pushing thighs into belt Active hamstring stretch with ankle AROM DF/PF nerve floss 3 x 20" each Checked SI alignment Marching with ab set 10x 5" SKTC 3x 30"  02/16/23 Review of HEP and goals Prone lying x 1' (increased tingling in left foot so d/c)  Supine: Diaphragmatic breathing x 1' SKTC 20" x 5 each Active hamstring stretch with ankle AROM DF/PF nerve floss 3 x 20" each Abdominal bracing 5" x  10 LTR x 10 DKTC x 5     02/14/23 Physical therapy evaluation and HEP instruction    PATIENT EDUCATION:  Education details: Patient educated on exam findings, POC, scope of PT, HEP, and what to expect next visit. Person educated: Patient Education method: Explanation, Demonstration, and Handouts Education comprehension: verbalized understanding, returned demonstration, verbal cues required, and tactile cues required  HOME EXERCISE PROGRAM: 03/02/20: sidelying abd 03/01/23: UE decompression exercise with UE, given GTB 02/24/23 decompression exercises leg press, leg lengthener and shoulder press Access Code: [email or web address] URL: [email or web address] 02/22/23 - Hooklying Isometric Hip Abduction Adduction with Belt and Ball  - 1 x daily - 7 x weekly - 3 sets - 10 reps - 5" hold - Supine Bridge  - 1 x daily - 7 x weekly - 3 sets - 10 reps - 5" hold Date: 02/16/2023 Prepared by: [facility]  Exercises - Supine Transversus Abdominis Bracing - Hands on Stomach  - 2 x daily - 7 x weekly - 1 sets - 10 reps - 5 sec hold - Supine Gluteal Sets  - 2 x daily - 7 x weekly - 3 sets - 10 reps - 5 sec hold - Hooklying Single Knee to Chest  - 2 x daily - 7 x weekly - 1 sets - 3 reps - 20 sec hold - Supine Lower Trunk Rotation  - 2 x daily - 7 x weekly - 1 sets - 10 reps - 2" hold - Supine Sciatic Nerve Glide  - 1 x daily - 7 x weekly - 1 sets - 3 reps - 20"  hold  Access Code: [email or web address] URL: [email or web address] Date: 02/14/2023 Prepared by: [facility]  Exercises - Prone Gluteal Sets  - 2 x daily - 7 x weekly - 1 sets - 10 reps - 5 sec hold - Supine Transversus Abdominis Bracing - Hands on Stomach  - 1 x daily - 7 x weekly - 1 sets - 10 reps - 5 sec hold  ASSESSMENT:  CLINICAL IMPRESSION: Discharge visit today as patient is getting ready to go out of town for a couple weeks.  We updated her HEP; met 5 x sit to stand goal today.  Will contact MD if she has further  needs  Eval:Patient is a 67 y.o. female who was seen today for physical therapy evaluation and treatment for low back pain and down into left leg. Patient demonstrates muscle weakness, reduced ROM, and fascial restrictions which are likely contributing to symptoms of pain and are negatively impacting patient ability to perform ADLs and functional mobility tasks. Patient will benefit from skilled physical therapy services to address these deficits to reduce pain and improve level of function with ADLs and functional mobility tasks.   OBJECTIVE IMPAIRMENTS: decreased activity tolerance, decreased endurance, decreased knowledge of condition, decreased mobility, difficulty walking, decreased ROM,  decreased strength, hypomobility, increased fascial restrictions, impaired perceived functional ability, increased muscle spasms, impaired flexibility, and pain.   ACTIVITY LIMITATIONS: carrying, lifting, bending, sitting, standing, squatting, sleeping, stairs, transfers, locomotion level, and caring for others  PARTICIPATION LIMITATIONS: meal prep, cleaning, laundry, medication management, community activity, and yard work  [facility] POTENTIAL: Good  CLINICAL DECISION MAKING: Stable/uncomplicated  EVALUATION COMPLEXITY: Low   GOALS: Goals reviewed with patient? No  SHORT TERM GOALS: Target date: 02/28/2023  patient will be independent with initial HEP Baseline: 03/10/23:  Reports compliance with HEP daily Goal status: MET  2.  Patient will self report 30% improvement to improve tolerance for functional activity   Baseline: 03/10/23:  20% improvements, reports she doesn't need to take Tylenol before bed at night. Goal status: IN PROGRESS   LONG TERM GOALS: Target date: 03/14/2023  Patient will be independent in self management strategies to improve quality of life and functional outcomes. Baseline:  Goal status: IN PROGRESS  2.  Patient will self report 50% improvement to improve tolerance for  functional activity   Baseline: 03/10/23:  20% improvements Goal status: IN PROGRESS  3.  Patient will improve FOTO score by 10 points  Baseline: 58; 03/10/23:  58% Goal status: IN PROGRESS  4.  Patient will increase left leg MMTs to 4+ to 5/5 without pain to promote return to ambulation community distances with minimal deviation.  Baseline: see above Goal status: IN PROGRESS  5.  Patient will improve 5 times sit to stand score from 15.46 sec sec to 12 sec to demonstrate improved functional mobility and increased lower extremity strength.  Baseline: 03/10/23: 5 STS 12.81" 10.85 sec 6/6 Goal status: MET  6.  Patient will stand and walk x 30 min without radiating left leg symptoms past her knee to improve ability to perform yard and house work efficiently Baseline: 03/10/23:  Increased pain while standing, able to stand/walk 30-40 minutes with throbbing pain on shin. Goal status: IN PROGRESS  PLAN:  PT FREQUENCY: 2x/week  PT DURATION: 4 weeks  PLANNED INTERVENTIONS: Therapeutic exercises, Therapeutic activity, Neuromuscular re-education, Balance training, Gait training, Patient/Family education, Joint manipulation, Joint mobilization, Stair training, Orthotic/Fit training, DME instructions, Aquatic Therapy, Dry Needling, Electrical stimulation, Spinal manipulation, Spinal mobilization, Cryotherapy, Moist heat, Compression bandaging, scar mobilization, Splintting, Taping, Traction, Ultrasound, Ionotophoresis 4mg /ml Dexamethasone, and Manual therapy .  PLAN FOR NEXT SESSION:  discharge  9:42 AM, 03/17/23 Cardarius Senat Small Virgil Lightner MPT [place] [facility] [place] [phone]

## 2023-03-24 ENCOUNTER — Encounter: Payer: Self-pay | Admitting: Rheumatology

## 2023-03-24 ENCOUNTER — Ambulatory Visit: Payer: Medicare PPO | Admitting: Rheumatology

## 2023-03-24 VITALS — BP 129/86 | HR 78 | Resp 14 | Ht 64.0 in | Wt 128.0 lb

## 2023-03-24 DIAGNOSIS — Z8679 Personal history of other diseases of the circulatory system: Secondary | ICD-10-CM

## 2023-03-24 DIAGNOSIS — M1990 Unspecified osteoarthritis, unspecified site: Secondary | ICD-10-CM

## 2023-03-24 DIAGNOSIS — Z8669 Personal history of other diseases of the nervous system and sense organs: Secondary | ICD-10-CM

## 2023-03-24 DIAGNOSIS — E785 Hyperlipidemia, unspecified: Secondary | ICD-10-CM

## 2023-03-24 DIAGNOSIS — G8929 Other chronic pain: Secondary | ICD-10-CM

## 2023-03-24 DIAGNOSIS — M810 Age-related osteoporosis without current pathological fracture: Secondary | ICD-10-CM

## 2023-03-24 DIAGNOSIS — M1380 Other specified arthritis, unspecified site: Secondary | ICD-10-CM

## 2023-03-24 DIAGNOSIS — I7300 Raynaud's syndrome without gangrene: Secondary | ICD-10-CM | POA: Diagnosis not present

## 2023-03-24 DIAGNOSIS — Z79899 Other long term (current) drug therapy: Secondary | ICD-10-CM

## 2023-03-24 DIAGNOSIS — M797 Fibromyalgia: Secondary | ICD-10-CM

## 2023-03-24 DIAGNOSIS — M47816 Spondylosis without myelopathy or radiculopathy, lumbar region: Secondary | ICD-10-CM

## 2023-03-24 DIAGNOSIS — M533 Sacrococcygeal disorders, not elsewhere classified: Secondary | ICD-10-CM

## 2023-03-24 NOTE — Patient Instructions (Signed)
Standing Labs We placed an order today for your standing lab work.   Please have your standing labs drawn in  July and every 3 months  TB Gold with next labs  Please have your labs drawn 2 weeks prior to your appointment so that the provider can discuss your lab results at your appointment, if possible.  Please note that you may see your imaging and lab results in MyChart before we have reviewed them. We will contact you once all results are reviewed. Please allow our office up to 72 hours to thoroughly review all of the results before contacting the office for clarification of your results.  WALK-IN LAB HOURS  Monday through Thursday from 8:00 am -12:30 pm and 1:00 pm-5:00 pm and Friday from 8:00 am-12:00 pm.  Patients with office visits requiring labs will be seen before walk-in labs.  You may encounter longer than normal wait times. Please allow additional time. Wait times may be shorter on  Monday and Thursday afternoons.  We do not book appointments for walk-in labs. We appreciate your patience and understanding with our staff.   Labs are drawn by [facility]. Please bring your co-pay at the time of your lab draw.  You may receive a bill from [facility] for your lab work.  Please note if you are on Hydroxychloroquine and and an order has been placed for a Hydroxychloroquine level,  you will need to have it drawn 4 hours or more after your last dose.  If you wish to have your labs drawn at another location, please call the office 24 hours in advance so we can fax the orders.  The office is located at [address], [address], [place], [place] [address]   If you have any questions regarding directions or hours of operation,  please call [phone].   As a reminder, please drink plenty of water prior to coming for your lab work. Thanks!  Vaccines You are taking a medication(s) that can suppress your immune system.  The following immunizations are recommended: Flu annually Covid-19   Td/Tdap (tetanus, diphtheria, pertussis) every 10 years Pneumonia (Prevnar 15 then Pneumovax 23 at least 1 year apart.  Alternatively, can take Prevnar 20 without needing additional dose) Shingrix: 2 doses from 4 weeks to 6 months apart  Please check with your PCP to make sure you are up to date.   If you have signs or symptoms of an infection or start antibiotics: First, call your PCP for workup of your infection. Hold your medication through the infection, until you complete your antibiotics, and until symptoms resolve if you take the following: Injectable medication (Actemra, Benlysta, Cimzia, Cosentyx, Enbrel, Humira, Kevzara, Orencia, Remicade, Simponi, Stelara, Taltz, Tremfya) Methotrexate Leflunomide (Arava) Mycophenolate (Cellcept) Harriette Ohara, Olumiant, or Rinvoq  Please get an annual skin examination to screen for skin cancer while you are on Humira.  Please use sunscreen and sun protection.

## 2023-04-04 ENCOUNTER — Ambulatory Visit: Payer: Medicare PPO | Admitting: Rheumatology

## 2023-05-10 ENCOUNTER — Other Ambulatory Visit: Payer: Self-pay | Admitting: *Deleted

## 2023-05-10 DIAGNOSIS — Z79899 Other long term (current) drug therapy: Secondary | ICD-10-CM

## 2023-05-12 NOTE — Progress Notes (Signed)
CBC and CMP are normal.

## 2023-05-15 NOTE — Progress Notes (Signed)
TB Gold is negative.

## 2023-06-02 ENCOUNTER — Other Ambulatory Visit: Payer: Self-pay | Admitting: Physician Assistant

## 2023-06-02 NOTE — Telephone Encounter (Signed)
Last Fill: 03/10/2023  Labs: 05/11/2023 CBC and CMP are normal.   TB Gold: 05/11/2023 Neg    Next Visit: 09/15/2023  Last Visit: 03/24/2023  DX: Inflammatory arthritis-   Current Dose per office note 03/24/2023: Humira 40 mg every 14 days   Okay to refill Humira?

## 2023-06-29 ENCOUNTER — Encounter: Payer: Self-pay | Admitting: *Deleted

## 2023-07-21 ENCOUNTER — Other Ambulatory Visit (HOSPITAL_COMMUNITY): Payer: Self-pay | Admitting: Nurse Practitioner

## 2023-07-21 DIAGNOSIS — Z1231 Encounter for screening mammogram for malignant neoplasm of breast: Secondary | ICD-10-CM

## 2023-07-28 ENCOUNTER — Ambulatory Visit (HOSPITAL_COMMUNITY)
Admission: RE | Admit: 2023-07-28 | Discharge: 2023-07-28 | Disposition: A | Discharge status: Home / Self Care | Payer: Medicare PPO | Source: Ambulatory Visit | Attending: Nurse Practitioner | Admitting: Nurse Practitioner

## 2023-07-28 ENCOUNTER — Encounter (HOSPITAL_COMMUNITY): Payer: Self-pay

## 2023-07-28 DIAGNOSIS — Z1231 Encounter for screening mammogram for malignant neoplasm of breast: Secondary | ICD-10-CM | POA: Insufficient documentation

## 2023-08-09 ENCOUNTER — Other Ambulatory Visit: Payer: Self-pay | Admitting: *Deleted

## 2023-08-09 DIAGNOSIS — M1990 Unspecified osteoarthritis, unspecified site: Secondary | ICD-10-CM

## 2023-08-09 DIAGNOSIS — Z79899 Other long term (current) drug therapy: Secondary | ICD-10-CM

## 2023-08-09 DIAGNOSIS — I7300 Raynaud's syndrome without gangrene: Secondary | ICD-10-CM

## 2023-08-09 MED ORDER — HUMIRA (2 PEN) 40 MG/0.4ML ~~LOC~~ AJKT
AUTO-INJECTOR | SUBCUTANEOUS | 2 refills | Status: DC
Start: 2023-08-09 — End: 2023-10-25

## 2023-08-09 NOTE — Telephone Encounter (Signed)
Last Fill: 06/02/2023  Labs: 05/11/2023  CBC and CMP are normal. I called patient to have labs updated.  TB Gold: 05/11/2023   TB Gold is negative.   Next Visit: 09/15/2023  Last Visit: 03/24/2023   [id number] arthritis-patient has longstanding seronegative inflammatory arthritis iritis a  Current Dose per office note 03/24/2023: Humira 40 mg every 14 days   Okay to refill Humira?

## 2023-08-11 ENCOUNTER — Other Ambulatory Visit: Payer: Self-pay

## 2023-08-11 DIAGNOSIS — Z79899 Other long term (current) drug therapy: Secondary | ICD-10-CM

## 2023-08-11 DIAGNOSIS — I7300 Raynaud's syndrome without gangrene: Secondary | ICD-10-CM

## 2023-08-11 DIAGNOSIS — M1990 Unspecified osteoarthritis, unspecified site: Secondary | ICD-10-CM

## 2023-08-13 LAB — CBC WITH DIFFERENTIAL/PLATELET
Absolute Lymphocytes: 1925 {cells}/uL (ref 850–3900)
Absolute Monocytes: 515 {cells}/uL (ref 200–950)
Basophils Absolute: 30 {cells}/uL (ref 0–200)
Basophils Relative: 0.6 %
Eosinophils Absolute: 90 {cells}/uL (ref 15–500)
Eosinophils Relative: 1.8 %
HCT: 41.3 % (ref 35.0–45.0)
Hemoglobin: 13.2 g/dL (ref 11.7–15.5)
MCH: 29.3 pg (ref 27.0–33.0)
MCHC: 32 g/dL (ref 32.0–36.0)
MCV: 91.8 fL (ref 80.0–100.0)
MPV: 8.7 fL (ref 7.5–12.5)
Monocytes Relative: 10.3 %
Neutro Abs: 2440 {cells}/uL (ref 1500–7800)
Neutrophils Relative %: 48.8 %
Platelets: 325 10*3/uL (ref 140–400)
RBC: 4.5 10*6/uL (ref 3.80–5.10)
RDW: 12.7 % (ref 11.0–15.0)
Total Lymphocyte: 38.5 %
WBC: 5 10*3/uL (ref 3.8–10.8)

## 2023-08-13 LAB — COMPLETE METABOLIC PANEL WITH GFR
AG Ratio: 1.5 (calc) (ref 1.0–2.5)
ALT: 15 U/L (ref 6–29)
AST: 19 U/L (ref 10–35)
Albumin: 4.1 g/dL (ref 3.6–5.1)
Alkaline phosphatase (APISO): 82 U/L (ref 37–153)
BUN: 13 mg/dL (ref 7–25)
CO2: 28 mmol/L (ref 20–32)
Calcium: 9.4 mg/dL (ref 8.6–10.4)
Chloride: 103 mmol/L (ref 98–110)
Creat: 0.77 mg/dL (ref 0.50–1.05)
Globulin: 2.7 g/dL (ref 1.9–3.7)
Glucose, Bld: 95 mg/dL (ref 65–99)
Potassium: 4.2 mmol/L (ref 3.5–5.3)
Sodium: 139 mmol/L (ref 135–146)
Total Bilirubin: 0.4 mg/dL (ref 0.2–1.2)
Total Protein: 6.8 g/dL (ref 6.1–8.1)
eGFR: 85 mL/min/{1.73_m2} (ref >=60)

## 2023-08-14 NOTE — Progress Notes (Signed)
CBC and CMP WNL

## 2023-08-17 ENCOUNTER — Other Ambulatory Visit: Payer: Self-pay | Admitting: Physician Assistant

## 2023-08-17 DIAGNOSIS — M1990 Unspecified osteoarthritis, unspecified site: Secondary | ICD-10-CM

## 2023-08-17 DIAGNOSIS — Z79899 Other long term (current) drug therapy: Secondary | ICD-10-CM

## 2023-08-17 DIAGNOSIS — I7300 Raynaud's syndrome without gangrene: Secondary | ICD-10-CM

## 2023-09-01 NOTE — Progress Notes (Unsigned)
Office Visit Note  Patient: Monica [name]             Date of Birth: 14-Oct-1955           MRN: [id number]             PCP: Gareth Morgan, MD Referring: Gareth Morgan, MD Visit Date: 09/15/2023 Occupation: @GUAROCC @  Subjective:  Medication monitoring   History of Present Illness: Monica [name] is a 67 y.o. female with history of inflammatory arthritis and osteoporosis.  Patient remains on  Humira 40 mg every 14 days and sulfasalazine 500 mg 1-2 tablets daily.  Patient states that she had tried coming off of sulfasalazine since she was clinically doing well but developed an increase in symptoms.  Patient states that most days she takes sulfasalazine 1 tablet once daily but at times she will increase the dose if having to perform or strenuous activities or is having an increase in symptoms.  She continues to tolerate sulfasalazine and Humira without any side effects.  She has had intermittent discomfort in both hands specifically in the DIP joints.  The symptoms have been intermittent.  She has been using Voltaren gel topically as needed for pain relief.  She also has occasional discomfort in her left knee especially when going down steps.  She denies any warmth or swelling in her left knee joint.  Patient states that overall her lower back pain has been manageable.  She has been experiencing less frequent and less severe nocturnal pain in her lower back. She denies any new medical conditions.  She denies any recent or recurrent infections.   Activities of Daily Living:  Patient reports morning stiffness for 40 minutes.   Patient Reports nocturnal pain.  Difficulty dressing/grooming: Denies Difficulty climbing stairs: Denies Difficulty getting out of chair: Reports Difficulty using hands for taps, buttons, cutlery, and/or writing: Denies  Review of Systems  Constitutional:  Negative for fatigue.  HENT:  Negative for mouth sores and mouth dryness.   Eyes:  Positive for dryness.   Respiratory:  Negative for shortness of breath.   Cardiovascular:  Negative for chest pain and palpitations.  Gastrointestinal:  Negative for blood in stool, constipation and diarrhea.  Endocrine: Negative for increased urination.  Genitourinary:  Negative for involuntary urination.  Musculoskeletal:  Positive for joint pain, joint pain and morning stiffness. Negative for gait problem, joint swelling, myalgias, muscle weakness, muscle tenderness and myalgias.  Skin:  Positive for hair loss and sensitivity to sunlight. Negative for color change and rash.  Allergic/Immunologic: Negative for susceptible to infections.  Neurological:  Negative for dizziness and headaches.  Hematological:  Negative for swollen glands.  Psychiatric/Behavioral:  Positive for sleep disturbance. Negative for depressed mood. The patient is not nervous/anxious.     PMFS History:  Patient Active Problem List   Diagnosis Date Noted   Special screening for malignant neoplasms, colon    High risk medication use 01/13/2017   History of iritis 01/13/2017   Essential hypertension 01/13/2017   Macular degeneration 01/13/2017   Spondylosis of lumbar region without myelopathy or radiculopathy 01/13/2017   Leg weakness 07/08/2014   Difficulty walking 07/08/2014   Vaginal dryness    Reiter's disease (HCC)    Raynaud disease    Fibromyalgia    Inflammatory arthritis    Osteopenia     Past Medical History:  Diagnosis Date   Arthritis    Fibromyalgia    Macular degeneration    Left eye   Osteopenia 05/2018  T score -2.3 FRAX 11% / 1.8% overall stable from prior DEXA   RA (rheumatoid arthritis) (HCC)    Raynaud disease    Reiters syndrome    Sleep disturbance    Vaginal dryness     Family History  Problem Relation Age of Onset   Alzheimer's disease Mother    Osteoarthritis Mother    Hypertension Sister    High Cholesterol Sister    Heart disease Brother        open heart surgery   Hyperlipidemia Brother     Heart attack Brother    Heart attack Brother    Heart disease Brother    Ulcers Brother        multiple bleeding ulcers   Healthy Son    Healthy Son    Past Surgical History:  Procedure Laterality Date   BREAST SURGERY     LEFT BR LUMP-Benign   COLONOSCOPY N/A 08/04/2018   Procedure: COLONOSCOPY;  Surgeon: West Bali, MD;  Location: [facility];  Service: Endoscopy;  Laterality: N/A;  8:30   POLYPECTOMY  08/04/2018   Procedure: POLYPECTOMY;  Surgeon: West Bali, MD;  Location: [facility];  Service: Endoscopy;;   Social History   Social History Narrative   Not on file   Immunization History  Administered Date(s) Administered   Influenza,inj,Quad PF,6+ Mos 08/24/2012, 09/25/2013, 11/13/2014, 11/17/2017   Moderna Sars-Covid-2 Vaccination 12/05/2019, 01/02/2020, 08/30/2020     Objective: Vital Signs: BP (!) 149/94 (BP Location: Left Arm, Patient Position: Sitting, Cuff Size: Normal)   Pulse 80   Resp 14   Ht 5\' 4"  (1.626 m)   Wt 132 lb (59.9 kg)   BMI 22.66 kg/m    Physical Exam Vitals and nursing note reviewed.  Constitutional:      Appearance: She is well-developed.  HENT:     Head: Normocephalic and atraumatic.  Eyes:     Conjunctiva/sclera: Conjunctivae normal.  Cardiovascular:     Rate and Rhythm: Normal rate and regular rhythm.     Heart sounds: Normal heart sounds.  Pulmonary:     Effort: Pulmonary effort is normal.     Breath sounds: Normal breath sounds.  Abdominal:     General: Bowel sounds are normal.     Palpations: Abdomen is soft.  Musculoskeletal:     Cervical back: Normal range of motion.  Lymphadenopathy:     Cervical: No cervical adenopathy.  Skin:    General: Skin is warm and dry.     Capillary Refill: Capillary refill takes less than 2 seconds.  Neurological:     Mental Status: She is alert and oriented to person, place, and time.  Psychiatric:        Behavior: Behavior normal.      Musculoskeletal Exam: C-spine,  thoracic spine, lumbar spine good range of motion.  Tenderness in the left paraspinal muscle in the lower lumbar region.  Shoulder joints, elbow joints, wrist joints, MCPs, PIPs, DIPs have good range of motion with no synovitis.  DIP prominence.  Complete fist formation noted bilaterally.  Hip joints have good range of motion with no groin pain.  Knee joints have good range of motion with no warmth or effusion.  Ankle joints have good range of motion with no tenderness or joint swelling.  No evidence of Achilles tendinitis or plantar fasciitis.  CDAI Exam: CDAI Score: -- Patient Global: --; Provider Global: -- Swollen: --; Tender: -- Joint Exam 09/15/2023   No joint exam has been  documented for this visit   There is currently no information documented on the homunculus. Go to the Rheumatology activity and complete the homunculus joint exam.  Investigation: No additional findings.  Imaging: No results found.  Recent Labs: Lab Results  Component Value Date   WBC 5.0 08/12/2023   HGB 13.2 08/12/2023   PLT 325 08/12/2023   NA 139 08/12/2023   K 4.2 08/12/2023   CL 103 08/12/2023   CO2 28 08/12/2023   GLUCOSE 95 08/12/2023   BUN 13 08/12/2023   CREATININE 0.77 08/12/2023   BILITOT 0.4 08/12/2023   ALKPHOS 84 10/10/2019   AST 19 08/12/2023   ALT 15 08/12/2023   PROT 6.8 08/12/2023   ALBUMIN 4.6 10/10/2019   CALCIUM 9.4 08/12/2023   GFRAA 84 10/22/2020   QFTBGOLDPLUS NEGATIVE 05/11/2023    Speciality Comments: TB gold neg 01/25/18 HIV, Hep, IG, SPEP neg 01/25/18  Procedures:  No procedures performed Allergies: Patient has no known allergies.   Assessment / Plan:     Visit Diagnoses: Inflammatory arthritis - Longstanding seronegative inflammatory arthritis and iritis: She has no synovitis or dactylitis on examination today.  She has not had any signs or symptoms of a flare recently.  She experiences intermittent discomfort in both hands and the left knee joint due to  underlying osteoarthritis.  No evidence of Achilles tendinitis or plantar fasciitis noted.  She has had less frequent and less severe lower back pain at night.  Overall her symptoms have been well-controlled taking Humira 40 mg sq injections every 14 days and sulfasalazine 500 mg 1 tablet daily.  Patient tried discontinuing sulfasalazine but had an increase in symptoms since she has resumed taking it once daily.  She was advised to notify us if she develops any signs or symptoms of a flare.  She will follow-up in the office in 5 months or sooner if needed.  High risk medication use - Humira 40 mg sq injections every 14 days and sulfasalazine 500 mg 2 tablets twice daily.   CBC and CMP updated on 08/12/23.  Her next lab work will be due in February and every 3 months to monitor for drug toxicity. TB gold negative on 05/11/23.  No recent or recurrent infections.  Discussed the importance of holding humira and sulfasalazine if she develops signs or symptoms of an infection and to resume once the infection has completely cleared.  Patient had an updated yearly skin examination in November 2024 which was unremarkable.  History of iritis: No signs or symptoms of flare.  No conjunctival injection noted.   Chronic left SI joint pain: Patient continues to experience intermittent discomfort in her left SI joint.  She has had less frequent and less severe flares as well as less nocturnal pain.  She uses Voltaren gel topically as needed for pain relief.  Spondylosis of lumbar region without myelopathy or radiculopathy: She experiences intermittent discomfort in her lower back.  She is no longer having symptoms of sciatica.  Raynaud's disease without gangrene: Not currently symptomatic.  No digital ulcerations.  No signs of sclerodactyly.    Age-related osteoporosis without current pathological fracture - DEXA updated on 02/25/21: [facility] spine BMD 0.771 with T score -2.5--ordered by gynecology.  She is taking a calcium  and vitamin D supplement daily.  Overdue to update DEXA.    Fibromyalgia:   Other medical conditions are listed as follows:  History of hypertension: Blood pressure was slightly elevated today in the office was rechecked prior to leaving.  Patient was advised to monitor blood pressure closely and to reach out to PCP if remains elevated.  Dyslipidemia  History of macular degeneration  Orders: No orders of the defined types were placed in this encounter.  Meds ordered this encounter  Medications   diclofenac Sodium (VOLTAREN) 1 % GEL    Sig: APPLY 2-4 GRAMS TO AFFECTED AREA UP TO FOUR TIMES DAILY AS NEEDED.    Dispense:  400 g    Refill:  2   Follow-Up Instructions: Return in about 5 months (around 02/13/2024).   Gearldine Bienenstock, PA-C  Note - This record has been created using Dragon software.  Chart creation errors have been sought, but may not always  have been located. Such creation errors do not reflect on  the standard of medical care.

## 2023-09-15 ENCOUNTER — Encounter: Payer: Self-pay | Admitting: Physician Assistant

## 2023-09-15 ENCOUNTER — Ambulatory Visit: Payer: Medicare PPO | Attending: Physician Assistant | Admitting: Physician Assistant

## 2023-09-15 VITALS — BP 143/90 | HR 75 | Resp 14 | Ht 64.0 in | Wt 132.0 lb

## 2023-09-15 DIAGNOSIS — Z8679 Personal history of other diseases of the circulatory system: Secondary | ICD-10-CM

## 2023-09-15 DIAGNOSIS — M1990 Unspecified osteoarthritis, unspecified site: Secondary | ICD-10-CM

## 2023-09-15 DIAGNOSIS — Z79899 Other long term (current) drug therapy: Secondary | ICD-10-CM

## 2023-09-15 DIAGNOSIS — M810 Age-related osteoporosis without current pathological fracture: Secondary | ICD-10-CM

## 2023-09-15 DIAGNOSIS — Z8669 Personal history of other diseases of the nervous system and sense organs: Secondary | ICD-10-CM

## 2023-09-15 DIAGNOSIS — E785 Hyperlipidemia, unspecified: Secondary | ICD-10-CM

## 2023-09-15 DIAGNOSIS — M47816 Spondylosis without myelopathy or radiculopathy, lumbar region: Secondary | ICD-10-CM

## 2023-09-15 DIAGNOSIS — M533 Sacrococcygeal disorders, not elsewhere classified: Secondary | ICD-10-CM | POA: Diagnosis not present

## 2023-09-15 DIAGNOSIS — I7300 Raynaud's syndrome without gangrene: Secondary | ICD-10-CM

## 2023-09-15 DIAGNOSIS — G8929 Other chronic pain: Secondary | ICD-10-CM

## 2023-09-15 DIAGNOSIS — M797 Fibromyalgia: Secondary | ICD-10-CM

## 2023-09-15 MED ORDER — DICLOFENAC SODIUM 1 % EX GEL: CUTANEOUS | 2 refills | Status: DC

## 2023-09-15 NOTE — Patient Instructions (Signed)

## 2023-10-25 ENCOUNTER — Other Ambulatory Visit: Payer: Self-pay | Admitting: *Deleted

## 2023-10-25 DIAGNOSIS — Z79899 Other long term (current) drug therapy: Secondary | ICD-10-CM

## 2023-10-25 DIAGNOSIS — M1990 Unspecified osteoarthritis, unspecified site: Secondary | ICD-10-CM

## 2023-10-25 DIAGNOSIS — I7300 Raynaud's syndrome without gangrene: Secondary | ICD-10-CM

## 2023-10-25 MED ORDER — HUMIRA (2 PEN) 40 MG/0.4ML ~~LOC~~ AJKT
AUTO-INJECTOR | SUBCUTANEOUS | 2 refills | Status: DC
Start: 2023-10-25 — End: 2024-01-11

## 2023-10-25 NOTE — Telephone Encounter (Signed)
 Refill request received via fax from [facility]  for Humira   Last Fill: 08/09/2023  Labs: 08/12/2023 CBC and CMP WNL   TB Gold: 05/11/2023 Neg    Next Visit: 03/13/2024  Last Visit: 09/15/2023  IK:[id number] arthritis   Current Dose per office note 09/15/2023: Humira  40 mg sq injections every 14 days   Okay to refill Humira ?

## 2023-11-01 ENCOUNTER — Other Ambulatory Visit: Payer: Self-pay | Admitting: Physician Assistant

## 2023-11-01 DIAGNOSIS — Z79899 Other long term (current) drug therapy: Secondary | ICD-10-CM

## 2023-11-01 DIAGNOSIS — M1990 Unspecified osteoarthritis, unspecified site: Secondary | ICD-10-CM

## 2023-11-01 DIAGNOSIS — I7300 Raynaud's syndrome without gangrene: Secondary | ICD-10-CM

## 2023-12-12 DIAGNOSIS — M069 Rheumatoid arthritis, unspecified: Secondary | ICD-10-CM | POA: Insufficient documentation

## 2023-12-21 ENCOUNTER — Telehealth: Payer: Self-pay | Admitting: *Deleted

## 2023-12-21 NOTE — Telephone Encounter (Signed)
 Procedure: colonoscopy  Height: 5'4" Weight: 130 lb      BMI: 22.3  Have you had a colonoscopy before?  Yes, 08/04/18, Dr.Fields  Do you have family history of colon cancer?  no  Do you have a family history of polyps? no  Previous colonoscopy with polyps removed? Yes,, 07/2018  Do you have a history colorectal cancer?   no  Are you diabetic?  no  Do you have a prosthetic or mechanical heart valve? no  Do you have a pacemaker/defibrillator?   no  Have you had endocarditis/atrial fibrillation?  no  Do you use supplemental oxygen/CPAP?  no  Have you had joint replacement within the last 12 months?  no  Do you tend to be constipated or have to use laxatives?  no   Do you have history of alcohol use? If yes, how much and how often.  no  Do you have history or are you using drugs? If yes, what do are you  using?  no  Have you ever had a stroke/heart attack?  no  Have you ever had a heart or other vascular stent placed,?no  Do you take weight loss medication? no  female patients,: have you had a hysterectomy? no                              are you post menopausal?  yes                              do you still have your menstrual cycle? no    Date of last menstrual period? 2012  Do you take any blood-thinning medications such as: (Plavix, aspirin, Coumadin, Aggrenox, Brilinta, Xarelto, Eliquis, Pradaxa, Savaysa or Effient)? no  If yes we need the name, milligram, dosage and who is prescribing doctor:  n/a             Current Outpatient Medications  Medication Sig Dispense Refill   adalimumab (HUMIRA, 2 PEN,) 40 MG/0.4ML pen INJECT 1 PEN UNDER THE SKIN EVERY 14 DAYS. 2 each 2   Cholecalciferol (VITAMIN D) 2000 units tablet Take 2,000 Units by mouth daily.     losartan (COZAAR) 50 MG tablet Take 50 mg by mouth daily.     sulfaSALAzine (AZULFIDINE) 500 MG tablet TAKE (2) TABLETS BY MOUTH TWICE DAILY. 360 tablet 0   acetaminophen (TYLENOL) 500 MG tablet Take 1,000 mg by  mouth as needed. (Patient not taking: Reported on 12/21/2023)     acyclovir (ZOVIRAX) 800 MG tablet Take 800 mg by mouth as needed. (Patient not taking: Reported on 12/21/2023)     Calcium Carb-Cholecalciferol (CALCIUM 500 + D3) 500-600 MG-UNIT TABS  (Patient not taking: Reported on 12/21/2023)     clobetasol ointment (TEMOVATE) 0.05 % Apply topically 2 (two) times a week. Apply twice daily x 2 weeks, then daily x 2 weeks, then every other day x 2 weeks, then twice weekly. (Patient not taking: Reported on 12/21/2023) 45 g 2   diclofenac Sodium (VOLTAREN) 1 % GEL APPLY 2-4 GRAMS TO AFFECTED AREA UP TO FOUR TIMES DAILY AS NEEDED. (Patient not taking: Reported on 12/21/2023) 400 g 2   Lifitegrast 5 % SOLN Apply 1 drop to eye daily as needed (Dry eye). (Patient not taking: Reported on 12/21/2023)     LORazepam (ATIVAN) 1 MG tablet Take 0.5-1 mg by mouth as needed. (Patient not taking: Reported  on 12/21/2023)     meclizine (ANTIVERT) 25 MG tablet SMARTSIG:0.5-1 Tablet(s) By Mouth PRN (Patient not taking: Reported on 12/21/2023)     methocarbamol (ROBAXIN) 500 MG tablet  (Patient not taking: Reported on 12/21/2023)     Multiple Vitamin (MULTIVITAMIN PO) Take by mouth daily. (Patient not taking: Reported on 12/21/2023)     No current facility-administered medications for this visit.    No Known Allergies

## 2024-01-11 ENCOUNTER — Other Ambulatory Visit: Payer: Self-pay | Admitting: *Deleted

## 2024-01-11 DIAGNOSIS — Z111 Encounter for screening for respiratory tuberculosis: Secondary | ICD-10-CM

## 2024-01-11 DIAGNOSIS — M1990 Unspecified osteoarthritis, unspecified site: Secondary | ICD-10-CM

## 2024-01-11 DIAGNOSIS — Z79899 Other long term (current) drug therapy: Secondary | ICD-10-CM

## 2024-01-11 DIAGNOSIS — Z9225 Personal history of immunosupression therapy: Secondary | ICD-10-CM

## 2024-01-11 DIAGNOSIS — I7300 Raynaud's syndrome without gangrene: Secondary | ICD-10-CM

## 2024-01-11 MED ORDER — HUMIRA (2 PEN) 40 MG/0.4ML ~~LOC~~ AJKT
AUTO-INJECTOR | SUBCUTANEOUS | 0 refills | Status: DC
Start: 2024-01-11 — End: 2024-02-07

## 2024-01-11 NOTE — Telephone Encounter (Signed)
 Refill request received via fax from [facility]  for Humira   Last Fill: 08/09/2023   Labs: 08/12/2023 CBC and CMP WNL    TB Gold: 05/11/2023 Neg     Next Visit: 03/13/2024   Last Visit: 09/15/2023   [id number] arthritis    Current Dose per office note 09/15/2023: Humira 40 mg sq injections every 14 days   Patient advised she is due to update her labs. Patient state she will update them next week.    Okay to refill Humira?

## 2024-01-19 LAB — COMPREHENSIVE METABOLIC PANEL WITH GFR
AG Ratio: 1.6 (calc) (ref 1.0–2.5)
ALT: 14 U/L (ref 6–29)
AST: 20 U/L (ref 10–35)
Albumin: 4.3 g/dL (ref 3.6–5.1)
Alkaline phosphatase (APISO): 81 U/L (ref 37–153)
BUN: 16 mg/dL (ref 7–25)
CO2: 30 mmol/L (ref 20–32)
Calcium: 9.3 mg/dL (ref 8.6–10.4)
Chloride: 99 mmol/L (ref 98–110)
Creat: 0.84 mg/dL (ref 0.50–1.05)
Globulin: 2.7 g/dL (ref 1.9–3.7)
Glucose, Bld: 89 mg/dL (ref 65–99)
Potassium: 4.2 mmol/L (ref 3.5–5.3)
Sodium: 136 mmol/L (ref 135–146)
Total Bilirubin: 0.4 mg/dL (ref 0.2–1.2)
Total Protein: 7 g/dL (ref 6.1–8.1)
eGFR: 76 mL/min/{1.73_m2} (ref >=60)

## 2024-01-19 LAB — CBC WITH DIFFERENTIAL/PLATELET
Absolute Lymphocytes: 2223 {cells}/uL (ref 850–3900)
Absolute Monocytes: 627 {cells}/uL (ref 200–950)
Basophils Absolute: 28 {cells}/uL (ref 0–200)
Basophils Relative: 0.5 %
Eosinophils Absolute: 381 {cells}/uL (ref 15–500)
Eosinophils Relative: 6.8 %
HCT: 44 % (ref 35.0–45.0)
Hemoglobin: 14.3 g/dL (ref 11.7–15.5)
MCH: 29.1 pg (ref 27.0–33.0)
MCHC: 32.5 g/dL (ref 32.0–36.0)
MCV: 89.6 fL (ref 80.0–100.0)
MPV: 8.8 fL (ref 7.5–12.5)
Monocytes Relative: 11.2 %
Neutro Abs: 2341 {cells}/uL (ref 1500–7800)
Neutrophils Relative %: 41.8 %
Platelets: 316 10*3/uL (ref 140–400)
RBC: 4.91 10*6/uL (ref 3.80–5.10)
RDW: 12.4 % (ref 11.0–15.0)
Total Lymphocyte: 39.7 %
WBC: 5.6 10*3/uL (ref 3.8–10.8)

## 2024-01-19 NOTE — Progress Notes (Signed)
 CBC and CMP are normal.

## 2024-02-06 ENCOUNTER — Other Ambulatory Visit: Payer: Self-pay | Admitting: Physician Assistant

## 2024-02-06 DIAGNOSIS — I7300 Raynaud's syndrome without gangrene: Secondary | ICD-10-CM

## 2024-02-06 DIAGNOSIS — Z79899 Other long term (current) drug therapy: Secondary | ICD-10-CM

## 2024-02-06 DIAGNOSIS — M1990 Unspecified osteoarthritis, unspecified site: Secondary | ICD-10-CM

## 2024-02-07 NOTE — Telephone Encounter (Signed)
 Last Fill: 01/11/2024  Labs: 01/18/2024 CBC and CMP are normal.   TB Gold: 05/11/2023 Neg    Next Visit: 03/13/2024  Last Visit: 09/15/2023  [id number] arthritis   Current Dose per office note 09/15/2023: Humira  40 mg sq injections every 14 days   Okay to refill Humira ?

## 2024-02-08 NOTE — Telephone Encounter (Signed)
ASA 2 okay to schedule.

## 2024-02-09 MED ORDER — NA SULFATE-K SULFATE-MG SULF 17.5-3.13-1.6 GM/177ML PO SOLN: 1.0000 | ORAL | 0 refills | Status: DC

## 2024-02-09 NOTE — Addendum Note (Signed)
 Addended by: Feliz Hosteller on: 02/09/2024 09:59 AM   Modules accepted: Orders

## 2024-02-09 NOTE — Telephone Encounter (Addendum)
 Spoke with pt. She has been scheduled for 5/7 with Dr. Mordechai April at 11am. Aware will send instructions via mychart. Rx for prep sent to pharmacy.   PA approved via cohere Auth# [id number]  DOS: 02/15/2024 - 03/17/2024

## 2024-02-15 ENCOUNTER — Encounter (HOSPITAL_COMMUNITY): Payer: Self-pay | Admitting: Internal Medicine

## 2024-02-15 ENCOUNTER — Encounter (HOSPITAL_COMMUNITY)
Admission: RE | Disposition: A | Discharge status: Home / Self Care | Payer: Self-pay | Source: Home / Self Care | Attending: Internal Medicine

## 2024-02-15 ENCOUNTER — Other Ambulatory Visit: Payer: Self-pay

## 2024-02-15 ENCOUNTER — Ambulatory Visit (HOSPITAL_COMMUNITY)
Admission: RE | Admit: 2024-02-15 | Discharge: 2024-02-15 | Disposition: A | Discharge status: Home / Self Care | Attending: Internal Medicine | Admitting: Internal Medicine

## 2024-02-15 ENCOUNTER — Ambulatory Visit (HOSPITAL_COMMUNITY): Admitting: Anesthesiology

## 2024-02-15 DIAGNOSIS — K5730 Diverticulosis of large intestine without perforation or abscess without bleeding: Secondary | ICD-10-CM | POA: Insufficient documentation

## 2024-02-15 DIAGNOSIS — Z860100 Personal history of colon polyps, unspecified: Secondary | ICD-10-CM

## 2024-02-15 DIAGNOSIS — Z1211 Encounter for screening for malignant neoplasm of colon: Secondary | ICD-10-CM | POA: Diagnosis not present

## 2024-02-15 DIAGNOSIS — K648 Other hemorrhoids: Secondary | ICD-10-CM | POA: Insufficient documentation

## 2024-02-15 DIAGNOSIS — M797 Fibromyalgia: Secondary | ICD-10-CM | POA: Diagnosis not present

## 2024-02-15 DIAGNOSIS — M1990 Unspecified osteoarthritis, unspecified site: Secondary | ICD-10-CM | POA: Insufficient documentation

## 2024-02-15 DIAGNOSIS — Z860101 Personal history of adenomatous and serrated colon polyps: Secondary | ICD-10-CM | POA: Diagnosis not present

## 2024-02-15 DIAGNOSIS — K649 Unspecified hemorrhoids: Secondary | ICD-10-CM

## 2024-02-15 DIAGNOSIS — I10 Essential (primary) hypertension: Secondary | ICD-10-CM | POA: Diagnosis not present

## 2024-02-15 HISTORY — PX: COLONOSCOPY: SHX5424

## 2024-02-15 SURGERY — COLONOSCOPY
Anesthesia: General

## 2024-02-15 MED ORDER — PROPOFOL 10 MG/ML IV BOLUS
INTRAVENOUS | Status: DC | PRN
  Administered 2024-02-15: 50 ug/kg/min via INTRAVENOUS

## 2024-02-15 MED ORDER — LACTATED RINGERS IV SOLN: INTRAVENOUS | Status: DC | PRN

## 2024-02-15 MED ORDER — LIDOCAINE 2% (20 MG/ML) 5 ML SYRINGE
INTRAMUSCULAR | Status: DC | PRN
  Administered 2024-02-15: 80 mg via INTRAVENOUS

## 2024-02-15 MED ORDER — LACTATED RINGERS IV SOLN: INTRAVENOUS | Status: DC

## 2024-02-15 NOTE — Discharge Instructions (Addendum)

## 2024-02-15 NOTE — H&P (Signed)
 Primary Care Physician:  Wendi Ham, NP Primary Gastroenterologist:  Dr. Mordechai April  Pre-Procedure History & Physical: HPI:  Monica [name] is a 68 y.o. female is here for a colonoscopy to be performed for surveillance purposes, personal history of adenomatous colon polyps in 2019  Past Medical History:  Diagnosis Date   Arthritis    Fibromyalgia    Macular degeneration    Left eye   Osteopenia 05/2018   T score -2.3 FRAX 11% / 1.8% overall stable from prior DEXA   RA (rheumatoid arthritis) (HCC)    Raynaud disease    Reiters syndrome    Sleep disturbance    Vaginal dryness     Past Surgical History:  Procedure Laterality Date   BREAST SURGERY     LEFT BR LUMP-Benign   COLONOSCOPY N/A 08/04/2018   Procedure: COLONOSCOPY;  Surgeon: Alyce Jubilee, MD;  Location: [facility];  Service: Endoscopy;  Laterality: N/A;  8:30   POLYPECTOMY  08/04/2018   Procedure: POLYPECTOMY;  Surgeon: Alyce Jubilee, MD;  Location: [facility];  Service: Endoscopy;;    Prior to Admission medications   Medication Sig Start Date End Date Taking? Authorizing Provider  Cholecalciferol (VITAMIN D ) 2000 units tablet Take 2,000 Units by mouth daily.   Yes [provider]  HUMIRA , 2 PEN, 40 MG/0.4ML pen INJECT 1 PEN UNDER THE SKIN EVERY 14 DAYS 02/07/24  Yes Romayne Clubs, PA-C  losartan (COZAAR) 50 MG tablet Take 50 mg by mouth daily. 04/08/20  Yes [provider]  sulfaSALAzine  (AZULFIDINE ) 500 MG tablet TAKE (2) TABLETS BY MOUTH TWICE DAILY. 01/31/23  Yes Romayne Clubs, PA-C  acetaminophen  (TYLENOL ) 500 MG tablet Take 1,000 mg by mouth as needed. Patient not taking: Reported on 12/21/2023    [provider]  acyclovir (ZOVIRAX) 800 MG tablet Take 800 mg by mouth as needed. Patient not taking: Reported on 12/21/2023 06/15/22   [provider]  Calcium Carb-Cholecalciferol (CALCIUM 500 + D3) 500-600 MG-UNIT TABS     [provider]  clobetasol  ointment  (TEMOVATE ) 0.05 % Apply topically 2 (two) times a week. Apply twice daily x 2 weeks, then daily x 2 weeks, then every other day x 2 weeks, then twice weekly. Patient not taking: Reported on 12/21/2023 06/24/22   Antonio Klinefelter A, NP  diclofenac  Sodium (VOLTAREN ) 1 % GEL APPLY 2-4 GRAMS TO AFFECTED AREA UP TO FOUR TIMES DAILY AS NEEDED. Patient not taking: Reported on 12/21/2023 09/15/23   Romayne Clubs, PA-C  Lifitegrast 5 % SOLN Apply 1 drop to eye daily as needed (Dry eye). Patient not taking: Reported on 12/21/2023    [provider]  LORazepam (ATIVAN) 1 MG tablet Take 0.5-1 mg by mouth as needed. Patient not taking: Reported on 12/21/2023 03/31/22   [provider]  meclizine (ANTIVERT) 25 MG tablet SMARTSIG:0.5-1 Tablet(s) By Mouth PRN Patient not taking: Reported on 12/21/2023 04/20/23   [provider]  methocarbamol  (ROBAXIN ) 500 MG tablet     [provider]  Multiple Vitamin (MULTIVITAMIN PO) Take by mouth daily. Patient not taking: Reported on 12/21/2023    [provider]  Na Sulfate-K Sulfate-Mg Sulfate concentrate (SUPREP) 17.5-3.13-1.6 GM/177ML SOLN Take 1 kit (354 mLs total) by mouth as directed. 02/09/24   Caoilainn Sacks K, DO    Allergies as of 02/09/2024   (No Known Allergies)    Family History  Problem Relation Age of Onset   Alzheimer's disease Mother  Osteoarthritis Mother    Hypertension Sister    High Cholesterol Sister    Heart disease Brother        open heart surgery   Hyperlipidemia Brother    Heart attack Brother    Heart attack Brother    Heart disease Brother    Ulcers Brother        multiple bleeding ulcers   Healthy Son    Healthy Son     Social History   Socioeconomic History   Marital status: Married    Spouse name: Not on file   Number of children: Not on file   Years of education: Not on file   Highest education level: Not on file  Occupational History   Not on file  Tobacco Use   Smoking  status: Never    Passive exposure: Never   Smokeless tobacco: Never  Vaping Use   Vaping status: Never Used  Substance and Sexual Activity   Alcohol use: Not Currently   Drug use: Never   Sexual activity: Not Currently    Birth control/protection: Post-menopausal    Comment: First IC >16, Partners <5, No h/o STDs  Other Topics Concern   Not on file  Social History Narrative   Not on file   Social Drivers of Health   Financial Resource Strain: Not on file  Food Insecurity: Not on file  Transportation Needs: Not on file  Physical Activity: Not on file  Stress: Not on file  Social Connections: Not on file  Intimate Partner Violence: Not on file    Review of Systems: See HPI, otherwise negative ROS  Physical Exam: Vital signs in last 24 hours: Temp:  [97.7 F (36.5 C)] 97.7 F (36.5 C) (05/07 0952) Pulse Rate:  [82] 82 (05/07 0952) Resp:  [19] 19 (05/07 0952) BP: (129)/(82) 129/82 (05/07 0952) SpO2:  [100 %] 100 % (05/07 0952) Weight:  [59 kg] 59 kg (05/07 0952)   General:   Alert,  Well-developed, well-nourished, pleasant and cooperative in NAD Head:  Normocephalic and atraumatic. Eyes:  Sclera clear, no icterus.   Conjunctiva pink. Ears:  Normal auditory acuity. Nose:  No deformity, discharge,  or lesions. Msk:  Symmetrical without gross deformities. Normal posture. Extremities:  Without clubbing or edema. Neurologic:  Alert and  oriented x4;  grossly normal neurologically. Skin:  Intact without significant lesions or rashes. Psych:  Alert and cooperative. Normal mood and affect.  Impression/Plan: Monica [name] is here for a colonoscopy to be performed for surveillance purposes, personal history of adenomatous colon polyps in 2019  The risks of the procedure including infection, bleed, or perforation as well as benefits, limitations, alternatives and imponderables have been reviewed with the patient. Questions have been answered. All parties agreeable.

## 2024-02-15 NOTE — Transfer of Care (Signed)
 Immediate Anesthesia Transfer of Care Note  Patient: Monica [name]  Procedure(s) Performed: COLONOSCOPY  Patient Location: Endoscopy Unit  Anesthesia Type:General  Level of Consciousness: drowsy  Airway & Oxygen Therapy: Patient Spontanous Breathing  Post-op Assessment: Report given to RN and Post -op Vital signs reviewed and stable  Post vital signs: Reviewed and stable  Last Vitals:  Vitals Value Taken Time  BP 114/72 02/15/24 1135  Temp 36.4 C 02/15/24 1135  Pulse 83 02/15/24 1135  Resp 20 02/15/24 1135  SpO2 99 % 02/15/24 1135    Last Pain:  Vitals:   02/15/24 1135  TempSrc: Oral  PainSc:       Patients Stated Pain Goal: 7 (02/15/24 0952)  Complications: No notable events documented.

## 2024-02-15 NOTE — Op Note (Signed)
 [facility] Patient Name: Monica [name] Procedure Date: 02/15/2024 11:01 AM MRN: [id number] Date of Birth: 24-Aug-1956 Attending MD: Rolando Cliche. Mordechai April , [place], [id number] CSN: [id number] Age: 68 Admit Type: Outpatient Procedure:                Colonoscopy Indications:              Surveillance: Personal history of adenomatous                            polyps on last colonoscopy > 5 years ago Providers:                Rolando Cliche. Mordechai April, DO, Crystal Page, Sharlette Dayhoff                            Technician, Technician Referring MD:              Medicines:                See the Anesthesia note for documentation of the                            administered medications Complications:            No immediate complications. Estimated Blood Loss:     Estimated blood loss: none. Procedure:                Pre-Anesthesia Assessment:                           - The anesthesia plan was to use monitored                            anesthesia care (MAC).                           After obtaining informed consent, the colonoscope                            was passed under direct vision. Throughout the                            procedure, the patient's blood pressure, pulse, and                            oxygen saturations were monitored continuously. The                            [id number]-HQ190L ([id number]) scope was introduced through                            the anus and advanced to the the cecum, identified                            by appendiceal orifice and ileocecal valve. The                            colonoscopy was performed without  difficulty. The                            patient tolerated the procedure well. The quality                            of the bowel preparation was evaluated using the                            BBPS [facility] Bowel Preparation Scale) with scores                            of: Right Colon = 3, Transverse Colon = 3 and Left                            Colon = 3  (entire mucosa seen well with no residual                            staining, small fragments of stool or opaque                            liquid). The total BBPS score equals 9. Scope In: 11:17:15 AM Scope Out: 11:30:38 AM Scope Withdrawal Time: 0 hours 8 minutes 46 seconds  Total Procedure Duration: 0 hours 13 minutes 23 seconds  Findings:      Non-bleeding internal hemorrhoids were found.      A few medium-mouthed and small-mouthed diverticula were found in the       sigmoid colon.      The exam was otherwise without abnormality. Impression:               - Non-bleeding internal hemorrhoids.                           - Diverticulosis in the sigmoid colon.                           - The examination was otherwise normal.                           - No specimens collected. Moderate Sedation:      Per Anesthesia Care Recommendation:           - Patient has a contact number available for                            emergencies. The signs and symptoms of potential                            delayed complications were discussed with the                            patient. Return to normal activities tomorrow.                            Written discharge instructions were provided to the  patient.                           - Resume previous diet.                           - Continue present medications.                           - Repeat colonoscopy in 10 years for screening                            purposes.                           - Return to GI clinic PRN. Procedure Code(s):        --- Professional ---                           W0981, Colorectal cancer screening; colonoscopy on                            individual at high risk Diagnosis Code(s):        --- Professional ---                           Z86.010, Personal history of colonic polyps                           K64.8, Other hemorrhoids                           K57.30, Diverticulosis of large  intestine without                            perforation or abscess without bleeding CPT copyright 2022 American Medical Association. All rights reserved. The codes documented in this report are preliminary and upon coder review may  be revised to meet current compliance requirements. Rolando Cliche. Mordechai April, DO Rolando Cliche. Mordechai April, DO 02/15/2024 11:33:50 AM This report has been signed electronically. Number of Addenda: 0

## 2024-02-15 NOTE — Anesthesia Preprocedure Evaluation (Signed)
 Anesthesia Evaluation  Patient identified by MRN, date of birth, ID band Patient awake    Reviewed: Allergy & Precautions, H&P , NPO status , Patient's Chart, lab work & pertinent test results, reviewed documented beta blocker date and time   Airway Mallampati: II  TM Distance: >3 FB Neck ROM: full    Dental no notable dental hx.    Pulmonary neg pulmonary ROS   Pulmonary exam normal breath sounds clear to auscultation       Cardiovascular Exercise Tolerance: Good hypertension, negative cardio ROS  Rhythm:regular Rate:Normal     Neuro/Psych  Neuromuscular disease negative neurological ROS  negative psych ROS   GI/Hepatic negative GI ROS, Neg liver ROS,,,  Endo/Other  negative endocrine ROS    Renal/GU negative Renal ROS  negative genitourinary   Musculoskeletal  (+) Arthritis ,  Fibromyalgia -  Abdominal   Peds  Hematology negative hematology ROS (+)   Anesthesia Other Findings   Reproductive/Obstetrics negative OB ROS                             Anesthesia Physical Anesthesia Plan  ASA: 2  Anesthesia Plan: General   Post-op Pain Management:    Induction:   PONV Risk Score and Plan: Propofol infusion  Airway Management Planned:   Additional Equipment:   Intra-op Plan:   Post-operative Plan:   Informed Consent: I have reviewed the patients History and Physical, chart, labs and discussed the procedure including the risks, benefits and alternatives for the proposed anesthesia with the patient or authorized representative who has indicated his/her understanding and acceptance.     Dental Advisory Given  Plan Discussed with: CRNA  Anesthesia Plan Comments:        Anesthesia Quick Evaluation

## 2024-02-16 ENCOUNTER — Encounter (HOSPITAL_COMMUNITY): Payer: Self-pay | Admitting: Internal Medicine

## 2024-02-17 NOTE — Anesthesia Postprocedure Evaluation (Signed)
 Anesthesia Post Note  Patient: Monica [name]  Procedure(s) Performed: COLONOSCOPY  Patient location during evaluation: Phase II Anesthesia Type: General Level of consciousness: awake Pain management: pain level controlled Vital Signs Assessment: post-procedure vital signs reviewed and stable Respiratory status: spontaneous breathing and respiratory function stable Cardiovascular status: blood pressure returned to baseline and stable Postop Assessment: no headache and no apparent nausea or vomiting Anesthetic complications: no Comments: Late entry   No notable events documented.   Last Vitals:  Vitals:   02/15/24 0952 02/15/24 1135  BP: 129/82 114/72  Pulse: 82 83  Resp: 19 20  Temp: 36.5 C 36.4 C  SpO2: 100% 99%    Last Pain:  Vitals:   02/15/24 1138  TempSrc:   PainSc: 0-No pain                 Coretha Dew

## 2024-02-28 NOTE — Progress Notes (Signed)
 Office Visit Note  Patient: Monica [name]             Date of Birth: 26-May-1956           MRN: [id number]             PCP: Wendi Ham, NP Referring: Darliss Ek, MD Visit Date: 03/13/2024 Occupation: @GUAROCC @  Subjective:  Medication management   History of Present Illness: Monica [name] is a 68 y.o. female with seronegative inflammatory arthritis and iritis.  She returns today after her last visit in December.  She stopped sulfasalazine  for a while and then started noticing increased stiffness.  She has been taking sulfasalazine  only 1 tablet daily since then.  She still continues to have some stiffness in her hands.  She reports some discomfort in her SI joints and also left trochanteric bursa.  She states she was doing some squats which exacerbated her symptoms.  She continues to have some lower back pain intermittently.  She has been experiencing dry eyes.  She has been using over-the-counter products.  She is planning to schedule an earlier appointment with the ophthalmologist.  Patient states that she has been experiencing right-sided facial twitching.  She states she has similar problems in the past which are precipitated by stress.  She lost her friend recently which has been stressful for her.  She started taking magnesium but has not noticed any improvement.    Activities of Daily Living:  Patient reports morning stiffness for 30-45 minutes.   Patient Reports nocturnal pain.  Difficulty dressing/grooming: Denies Difficulty climbing stairs: Denies Difficulty getting out of chair: Denies Difficulty using hands for taps, buttons, cutlery, and/or writing: Reports  Review of Systems  Constitutional:  Positive for fatigue.  HENT:  Negative for mouth sores and mouth dryness.   Eyes:  Positive for dryness.  Respiratory:  Negative for shortness of breath.   Cardiovascular:  Negative for chest pain and palpitations.  Gastrointestinal:  Negative for blood in stool,  constipation and diarrhea.  Endocrine: Negative for increased urination.  Genitourinary:  Negative for involuntary urination.  Musculoskeletal:  Positive for joint pain, joint pain, myalgias, morning stiffness and myalgias. Negative for gait problem, joint swelling, muscle weakness and muscle tenderness.  Skin:  Positive for hair loss and sensitivity to sunlight. Negative for color change and rash.  Allergic/Immunologic: Negative for susceptible to infections.  Neurological:  Negative for dizziness and headaches.  Hematological:  Negative for swollen glands.  Psychiatric/Behavioral:  Positive for sleep disturbance. Negative for depressed mood. The patient is nervous/anxious.     PMFS History:  Patient Active Problem List   Diagnosis Date Noted   Special screening for malignant neoplasms, colon    High risk medication use 01/13/2017   History of iritis 01/13/2017   Essential hypertension 01/13/2017   Macular degeneration 01/13/2017   Spondylosis of lumbar region without myelopathy or radiculopathy 01/13/2017   Leg weakness 07/08/2014   Difficulty walking 07/08/2014   Vaginal dryness    Reiter's disease (HCC)    Raynaud disease    Fibromyalgia    Inflammatory arthritis    Osteopenia     Past Medical History:  Diagnosis Date   Arthritis    Fibromyalgia    Macular degeneration    Left eye   Osteopenia 05/2018   T score -2.3 FRAX 11% / 1.8% overall stable from prior DEXA   RA (rheumatoid arthritis) (HCC)    Raynaud disease    Reiters syndrome    Sleep  disturbance    Vaginal dryness     Family History  Problem Relation Age of Onset   Alzheimer's disease Mother    Osteoarthritis Mother    Hypertension Sister    High Cholesterol Sister    Heart disease Brother        open heart surgery   Hyperlipidemia Brother    Heart attack Brother    Heart attack Brother    Heart disease Brother    Ulcers Brother        multiple bleeding ulcers   Healthy Son    Healthy Son     Past Surgical History:  Procedure Laterality Date   BREAST SURGERY     LEFT BR LUMP-Benign   COLONOSCOPY N/A 08/04/2018   Procedure: COLONOSCOPY;  Surgeon: Alyce Jubilee, MD;  [facility]: [facility];  Service: Endoscopy;  Laterality: N/A;  8:30   COLONOSCOPY N/A 02/15/2024   Procedure: COLONOSCOPY;  Surgeon: Vinetta Greening, DO;  Location: [facility];  Service: Endoscopy;  Laterality: N/A;  11:00am, asa 2   POLYPECTOMY  08/04/2018   Procedure: POLYPECTOMY;  Surgeon: Alyce Jubilee, MD;  Location: [facility];  Service: Endoscopy;;   Social History   Social History Narrative   Not on file   Immunization History  Administered Date(s) Administered   Influenza,inj,Quad PF,6+ Mos 08/24/2012, 09/25/2013, 11/13/2014, 11/17/2017   Moderna Sars-Covid-2 Vaccination 12/05/2019, 01/02/2020, 08/30/2020     Objective: Vital Signs: BP 129/84 (BP Location: Left Arm, Patient Position: Sitting, Cuff Size: Normal)   Pulse 92   Resp 14   Ht 5\' 4"  (1.626 m)   Wt 130 lb (59 kg)   BMI 22.31 kg/m    Physical Exam Vitals and nursing note reviewed.  Constitutional:      Appearance: She is well-developed.  HENT:     Head: Normocephalic and atraumatic.  Eyes:     Conjunctiva/sclera: Conjunctivae normal.  Cardiovascular:     Rate and Rhythm: Normal rate and regular rhythm.     Heart sounds: Normal heart sounds.  Pulmonary:     Effort: Pulmonary effort is normal.     Breath sounds: Normal breath sounds.  Abdominal:     General: Bowel sounds are normal.     Palpations: Abdomen is soft.  Musculoskeletal:     Cervical back: Normal range of motion.  Lymphadenopathy:     Cervical: No cervical adenopathy.  Skin:    General: Skin is warm and dry.     Capillary Refill: Capillary refill takes less than 2 seconds.  Neurological:     Mental Status: She is alert and oriented to person, place, and time.  Psychiatric:        Behavior: Behavior normal.      Musculoskeletal Exam:  Cervical, thoracic and lumbar spine were in good range of motion.  She had tenderness over bilateral SI joints.  Shoulders, elbows, wrist joints, MCPs PIPs and DIPs were in good range of motion with no synovitis.  PIP and DIP thickening with no synovitis was noted.  Hip joints and knee joints in good range of motion without any warmth swelling or effusion.  There was no tenderness over ankles and MTPs.  CDAI Exam: CDAI Score: -- Patient Global: --; Provider Global: -- Swollen: --; Tender: -- Joint Exam 03/13/2024   No joint exam has been documented for this visit   There is currently no information documented on the homunculus. Go to the Rheumatology activity and complete the homunculus joint exam.  Investigation: No additional findings.  Imaging: No results found.  Recent Labs: Lab Results  Component Value Date   WBC 5.6 01/18/2024   HGB 14.3 01/18/2024   PLT 316 01/18/2024   NA 136 01/18/2024   K 4.2 01/18/2024   CL 99 01/18/2024   CO2 30 01/18/2024   GLUCOSE 89 01/18/2024   BUN 16 01/18/2024   CREATININE 0.84 01/18/2024   BILITOT 0.4 01/18/2024   ALKPHOS 84 10/10/2019   AST 20 01/18/2024   ALT 14 01/18/2024   PROT 7.0 01/18/2024   ALBUMIN 4.6 10/10/2019   CALCIUM 9.3 01/18/2024   GFRAA 84 10/22/2020   QFTBGOLDPLUS NEGATIVE 05/11/2023    Speciality Comments: TB gold neg 01/25/18 HIV, Hep, IG, SPEP neg 01/25/18  Procedures:  No procedures performed Allergies: Patient has no known allergies.   Assessment / Plan:     Visit Diagnoses: Inflammatory arthritis - Longstanding seronegative inflammatory arthritis and iritis: Patient has not had any flares of iritis.  However according the patient her eyes are still dry and irritated.  She plans to see her ophthalmologist soon.  She had cut back on sulfasalazine  only 500 mg daily.  She states since then she has been experiencing increasing stiffness.  She wants to go back to the original dose of sulfasalazine  2 tablets p.o.  twice daily.  She has been taking Humira  40 mg subcu every 14 days without any interruption.  She has not noticed any joint swelling.  High risk medication use - Humira  40 mg sq injections every 14 days and sulfasalazine  500 mg p.o. daily.  Labs from April 2025 were reviewed.  CBC and CMP were normal.  TB Gold was negative in July 2024.  Will recheck labs in July and repeat TB Gold.  Information immunization was placed in the wrist.  She was advised to hold email if she develops an infection resume after the infection resolves.  Annual skin examination to screen for skin cancer was advised.  Use sunscreen and sun protection was discussed.  History of iritis-she is planning to see the ophthalmologist soon as she continues to have dry eyes and some eye irritation.  Facial twitching-she has been experiencing right sided facial twitching.  She states the symptoms come and go.  She had similar issues in the past when she was under stress.  She has been under a lot of stress recently as she lost her friend.  I will refer her to neurology as she has been taking anti-TNF's.  Chronic left SI joint pain-she has intermittent left SI joint pain.  She has been experiencing increased discomfort recently.  I advised her to contact us  in case her symptoms get worse.  We may consider cortisone injection.  Spondylosis of lumbar region without myelopathy or radiculopathy-she states the pain gets worse when she does gardening.  She is currently not having much discomfort.  Raynaud's disease without gangrene-only symptomatic when exposed to the colder temperatures.  Age-related osteoporosis without current pathological fracture - DEXA updated on 02/25/21: [facility] spine BMD 0.771 with T score -2.5--ordered by gynecology.  Repeat DEXA scan was advised.  Fibromyalgia-she continues to have some generalized pain and discomfort.  History of hypertension-blood pressure was normal at 129/84 today.  Dyslipidemia-increased risk of  heart disease with autoimmune disease was discussed.  Need for regular exercise and stretching was emphasized.  Dietary modifications were discussed.  History of macular degeneration  Orders: No orders of the defined types were placed in this encounter.  No orders of  the defined types were placed in this encounter.   Follow-Up Instructions: Return in about 5 months (around 08/13/2024) for Inflammatory arthritis, iritis.   Nicholas Bari, MD  Note - This record has been created using Animal nutritionist.  Chart creation errors have been sought, but may not always  have been located. Such creation errors do not reflect on  the standard of medical care.

## 2024-03-13 ENCOUNTER — Encounter: Payer: Self-pay | Admitting: Rheumatology

## 2024-03-13 ENCOUNTER — Ambulatory Visit: Payer: Medicare PPO | Attending: Rheumatology | Admitting: Rheumatology

## 2024-03-13 VITALS — BP 129/84 | HR 92 | Resp 14 | Ht 64.0 in | Wt 130.0 lb

## 2024-03-13 DIAGNOSIS — M1990 Unspecified osteoarthritis, unspecified site: Secondary | ICD-10-CM | POA: Diagnosis not present

## 2024-03-13 DIAGNOSIS — Z79899 Other long term (current) drug therapy: Secondary | ICD-10-CM | POA: Diagnosis not present

## 2024-03-13 DIAGNOSIS — M47816 Spondylosis without myelopathy or radiculopathy, lumbar region: Secondary | ICD-10-CM

## 2024-03-13 DIAGNOSIS — E785 Hyperlipidemia, unspecified: Secondary | ICD-10-CM

## 2024-03-13 DIAGNOSIS — M533 Sacrococcygeal disorders, not elsewhere classified: Secondary | ICD-10-CM

## 2024-03-13 DIAGNOSIS — Z8679 Personal history of other diseases of the circulatory system: Secondary | ICD-10-CM

## 2024-03-13 DIAGNOSIS — M810 Age-related osteoporosis without current pathological fracture: Secondary | ICD-10-CM | POA: Diagnosis not present

## 2024-03-13 DIAGNOSIS — G8929 Other chronic pain: Secondary | ICD-10-CM

## 2024-03-13 DIAGNOSIS — I7300 Raynaud's syndrome without gangrene: Secondary | ICD-10-CM | POA: Diagnosis not present

## 2024-03-13 DIAGNOSIS — Z8669 Personal history of other diseases of the nervous system and sense organs: Secondary | ICD-10-CM | POA: Diagnosis not present

## 2024-03-13 DIAGNOSIS — M797 Fibromyalgia: Secondary | ICD-10-CM | POA: Diagnosis not present

## 2024-03-13 DIAGNOSIS — G514 Facial myokymia: Secondary | ICD-10-CM

## 2024-03-13 MED ORDER — SULFASALAZINE 500 MG PO TABS: 1000.0000 mg | ORAL_TABLET | Freq: Two times a day (BID) | ORAL | 2 refills | Status: DC

## 2024-03-13 NOTE — Patient Instructions (Signed)
 Standing Labs We placed an order today for your standing lab work.   Please have your standing labs drawn in July and every 3 months  Please have your labs drawn 2 weeks prior to your appointment so that the provider can discuss your lab results at your appointment, if possible.  Please note that you may see your imaging and lab results in MyChart before we have reviewed them. We will contact you once all results are reviewed. Please allow our office up to 72 hours to thoroughly review all of the results before contacting the office for clarification of your results.  WALK-IN LAB HOURS  Monday through Thursday from 8:00 am -12:30 pm and 1:00 pm-4:00 pm and Friday from 8:00 am-12:00 pm.  Patients with office visits requiring labs will be seen before walk-in labs.  You may encounter longer than normal wait times. Please allow additional time. Wait times may be shorter on  Monday and Thursday afternoons.  We do not book appointments for walk-in labs. We appreciate your patience and understanding with our staff.   Labs are drawn by [facility]. Please bring your co-pay at the time of your lab draw.  You may receive a bill from [facility] for your lab work.  Please note if you are on Hydroxychloroquine and and an order has been placed for a Hydroxychloroquine level,  you will need to have it drawn 4 hours or more after your last dose.  If you wish to have your labs drawn at another location, please call the office 24 hours in advance so we can fax the orders.  The office is located at [address], [place], [place] [address]   If you have any questions regarding directions or hours of operation,  please call [phone].   As a reminder, please drink plenty of water prior to coming for your lab work. Thanks!   Vaccines You are taking a medication(s) that can suppress your immune system.  The following immunizations are recommended: Flu annually Covid-19  Td/Tdap (tetanus, diphtheria,  pertussis) every 10 years Pneumonia (Prevnar 15 then Pneumovax 23 at least 1 year apart.  Alternatively, can take Prevnar 20 without needing additional dose) Shingrix: 2 doses from 4 weeks to 6 months apart  Please check with your PCP to make sure you are up to date.   If you have signs or symptoms of an infection or start antibiotics: First, call your PCP for workup of your infection. Hold your medication through the infection, until you complete your antibiotics, and until symptoms resolve if you take the following: Injectable medication (Actemra, Benlysta, Cimzia, Cosentyx, Enbrel, Humira , Kevzara, Orencia, Remicade, Simponi, Stelara, Taltz, Tremfya) Methotrexate Leflunomide (Arava) Mycophenolate (Cellcept) Watson Hacking, or Rinvoq  Get an annual skin examination to screen for skin cancer while you are on Humira .  Please use sunscreen and sun protection.

## 2024-03-19 ENCOUNTER — Encounter: Payer: Self-pay | Admitting: Neurology

## 2024-03-19 DIAGNOSIS — I10 Essential (primary) hypertension: Secondary | ICD-10-CM | POA: Diagnosis not present

## 2024-03-19 LAB — LAB REPORT - SCANNED: EGFR: 69

## 2024-03-23 DIAGNOSIS — E7849 Other hyperlipidemia: Secondary | ICD-10-CM | POA: Diagnosis not present

## 2024-03-23 DIAGNOSIS — Z79899 Other long term (current) drug therapy: Secondary | ICD-10-CM | POA: Diagnosis not present

## 2024-03-23 DIAGNOSIS — M1380 Other specified arthritis, unspecified site: Secondary | ICD-10-CM | POA: Diagnosis not present

## 2024-03-23 DIAGNOSIS — G245 Blepharospasm: Secondary | ICD-10-CM | POA: Diagnosis not present

## 2024-03-23 DIAGNOSIS — I10 Essential (primary) hypertension: Secondary | ICD-10-CM | POA: Diagnosis not present

## 2024-03-23 DIAGNOSIS — Z79620 Long term (current) use of immunosuppressive biologic: Secondary | ICD-10-CM | POA: Diagnosis not present

## 2024-03-26 NOTE — Progress Notes (Unsigned)
 Assessment/Plan:   1.  Hemifacial spasm, right/blepharospasm  - Discussed nature and pathophysiology.  Discussed that this is often idiopathic, but we will do MRI/MRA of the brain to make sure that no tortuous blood vessels around cranial nerve VII.  If not, we discussed treatment with toxin.  We discussed risk, benefits, side effects, including blackbox warnings.  We will try to get approval.  -mild right face droop (pt thinks long term).  Nasolabial folds are symmetric, but she has just a tiny bit of asymmetry.  She notices no drooling.  She has no trouble grasping a straw.  She is able to activate the muscles of facial expression symmetrically.  Her husband brings some's pictures where she does appear to have some facial asymmetry on the right.  I wonder if perhaps she had a very mild cranial nerve VII and now has some aberrant reinnervation, but I cannot say this definitively.  Nonetheless, the treatment really would be the same. Subjective:   Monica [name] was seen today in neurologic consultation at the request of Nicholas Bari, MD. This patient is accompanied in the office by her spouse who supplements the history.   The consultation is for the evaluation of right facial twitching.  Medical records made available to me are reviewed.  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  She followed up with her rheumatologist in June and during that visit noted that she was having right-sided facial twitching.  She apparently had history of this with stress in the past.  Pt states that this occurred a few years ago and lasted a few weeks.  She cannot remember if it was the same side of the face.  She has been under stress because of the death of her friend and that is when this started but it hasn't gone away.  It has persisted now for 2 months.  She has better and worse days.  Its worse when she starts to eat.  She has trouble with reading because of it.  No  hx of bells palsy, but husband notices right facial droop and brings out several pictures to demonstrate.  Patient thinks that this has been like this for a long time when she looks back, but husband is not so sure.  No viral illness prior to this.  On Humira  since around 2019/2020.     ALLERGIES:  No Known Allergies  CURRENT MEDICATIONS:  Outpatient Encounter Medications as of 03/28/2024  Medication Sig   Cholecalciferol (VITAMIN D ) 2000 units tablet Take 2,000 Units by mouth daily.   HUMIRA , 2 PEN, 40 MG/0.4ML pen INJECT 1 PEN UNDER THE SKIN EVERY 14 DAYS   losartan (COZAAR) 50 MG tablet Take 50 mg by mouth daily.   magnesium oxide (MAG-OX) 400 (240 Mg) MG tablet Take 400 mg by mouth daily.   sulfaSALAzine  (AZULFIDINE ) 500 MG tablet Take 2 tablets (1,000 mg total) by mouth 2 (two) times daily.   No facility-administered encounter medications on file as of 03/28/2024.    Objective:   PHYSICAL EXAMINATION:    VITALS:   Vitals:   03/28/24 0832  BP: (!) 149/86  Pulse: 78  SpO2: 99%  Weight: 131 lb 9.6 oz (59.7 kg)  Height: 5' 4 (1.626 m)    GEN:  Normal appears female in no acute distress.  Appears stated age. HEENT:  Normocephalic, atraumatic. The mucous membranes are moist. The superficial temporal arteries are without ropiness or tenderness. Cardiovascular: Regular rate and rhythm.  Lungs: Clear to auscultation bilaterally. Neck/Heme: There are no carotid bruits noted bilaterally.  NEUROLOGICAL: Orientation:  The patient is alert and oriented x 3.   Cranial nerves: There is symmetric nasolabial fold.  Her husband brings some pictures to show that intermittently, it appears that there is some right facial droop.  She is able to activate the muscles of facial expression symmetrically.  She has symmetric forehead wrinkle.  She is able to close her eyes tightly and squeeze them both shut.  There is no Bell's phenomenon.  She does have significant twitch on the right side when her  eyes are squeezed shut and opened back up.  Extraocular muscles are intact and visual fields are full to confrontational testing. Speech is fluent and clear. Soft palate rises symmetrically and there is no tongue deviation. Hearing is intact to conversational tone. Tone: Tone is good throughout. Sensation: Sensation is intact to light touch and pinprick throughout (facial, trunk, extremities). Vibration is intact at the bilateral big toe. There is no extinction with double simultaneous stimulation. There is no sensory dermatomal level identified. Coordination:  The patient has no difficulty with RAM's or FNF bilaterally. Motor: Strength is 5/5 in the bilateral upper and lower extremities.  Shoulder shrug is equal and symmetric. There is no pronator drift.  There are no fasciculations noted. DTR's: Deep tendon reflexes are 2/4 at the bilateral biceps, triceps, brachioradialis, patella and achilles.  Plantar responses are downgoing bilaterally. Gait and Station: The patient is able to ambulate without difficulty. The patient is able to heel toe walk without any difficulty. The patient is able to ambulate in a tandem fashion. The patient is able to stand in the Romberg position. Abnormal movements: There is facial twitching around right orbicularis oculi, superior and inferior and a little bit and rarely around zygomaticus on the right.    Total time spent on today's visit was 45 minutes, including both face-to-face time and nonface-to-face time.  Time included that spent on review of records (prior notes available to me/labs/imaging if pertinent), discussing treatment and goals, answering patient's questions and coordinating care.   Cc:  Wendi Ham, NP

## 2024-03-27 ENCOUNTER — Telehealth: Payer: Self-pay | Admitting: *Deleted

## 2024-03-27 NOTE — Telephone Encounter (Signed)
 Labs received from: Dr. Izetta Marshall  Drawn on: 03/19/2024  Reviewed by: Jacinta Martinis, PA-C  Labs drawn: Lipid Panel, CMP,CBC  Results: Cholesterol, Total 257     LDL Chol CALC (NIH) 187    T. CHOL/HDL Ratio 4.6  Patient is on Humira  40 mg SQ every 14 days and SSZ 500 mg 2 tabs BID.

## 2024-03-28 ENCOUNTER — Ambulatory Visit: Admitting: Neurology

## 2024-03-28 ENCOUNTER — Telehealth: Payer: Self-pay

## 2024-03-28 ENCOUNTER — Encounter: Payer: Self-pay | Admitting: Neurology

## 2024-03-28 VITALS — BP 132/86 | HR 78 | Ht 64.0 in | Wt 131.6 lb

## 2024-03-28 DIAGNOSIS — G5131 Clonic hemifacial spasm, right: Secondary | ICD-10-CM

## 2024-03-28 DIAGNOSIS — G8190 Hemiplegia, unspecified affecting unspecified side: Secondary | ICD-10-CM

## 2024-03-28 NOTE — Telephone Encounter (Signed)
 PA needed for new start up of Xeomin 50 units [id number] Blepharospasm

## 2024-03-28 NOTE — Telephone Encounter (Signed)
 Called for PA clinicals needed , faxed to [facility] [phone] Tracking # [id number]

## 2024-03-31 ENCOUNTER — Ambulatory Visit (HOSPITAL_COMMUNITY)

## 2024-04-06 ENCOUNTER — Telehealth: Payer: Self-pay | Admitting: Pharmacy Technician

## 2024-04-06 ENCOUNTER — Other Ambulatory Visit (HOSPITAL_COMMUNITY): Payer: Self-pay

## 2024-04-06 NOTE — Telephone Encounter (Signed)
 Pharmacy Patient Advocate Encounter   Received notification from Pt Calls Messages that prior authorization for XEOMIN 50 is required/requested.   Insurance verification completed.   The patient is insured through [employer] .   Per test claim: PA required; PA submitted to above mentioned insurance via CoverMyMeds Key/confirmation #/EOC [id number] Status is pending

## 2024-04-06 NOTE — Telephone Encounter (Signed)
 Pharmacy Patient Advocate Encounter- Injection via Pharmacy Benefit:  PA was submitted  for Xeomin- J0588 to [employer] and has been approved through: 1.1.25 TO 12.31.25 Authorization# [id number]  Please send prescription to Specialty Pharmacy: [facility]: [phone]  Estimated Pharmacy Copay is: $0   Admin Code: [id number]   Does Not require Prior Auth.

## 2024-04-06 NOTE — Telephone Encounter (Signed)
 PA has been submitted, and telephone encounter has been created. Please see telephone encounter dated 6.27.25.

## 2024-04-09 ENCOUNTER — Other Ambulatory Visit: Payer: Self-pay

## 2024-04-09 DIAGNOSIS — G5131 Clonic hemifacial spasm, right: Secondary | ICD-10-CM

## 2024-04-09 MED ORDER — XEOMIN 50 UNITS IM SOLR
INTRAMUSCULAR | 3 refills | Status: AC
  Filled 2024-04-18: qty 1, 84d supply, fill #0
  Filled 2024-06-29 – 2024-07-23 (×2): qty 1, 84d supply, fill #1
  Filled 2024-10-19 – 2024-10-22 (×2): qty 1, 84d supply, fill #2

## 2024-04-09 NOTE — Telephone Encounter (Signed)
 RX sent to [facility].

## 2024-04-12 ENCOUNTER — Ambulatory Visit (HOSPITAL_COMMUNITY)
Admission: RE | Admit: 2024-04-12 | Discharge: 2024-04-12 | Disposition: A | Discharge status: Home / Self Care | Source: Ambulatory Visit | Attending: Neurology | Admitting: Neurology

## 2024-04-12 DIAGNOSIS — R29810 Facial weakness: Secondary | ICD-10-CM | POA: Diagnosis not present

## 2024-04-12 DIAGNOSIS — I6622 Occlusion and stenosis of left posterior cerebral artery: Secondary | ICD-10-CM | POA: Diagnosis not present

## 2024-04-12 DIAGNOSIS — G5139 Clonic hemifacial spasm, unspecified: Secondary | ICD-10-CM | POA: Diagnosis not present

## 2024-04-12 DIAGNOSIS — G8190 Hemiplegia, unspecified affecting unspecified side: Secondary | ICD-10-CM

## 2024-04-16 ENCOUNTER — Ambulatory Visit: Payer: Self-pay | Admitting: Neurology

## 2024-04-18 ENCOUNTER — Other Ambulatory Visit: Payer: Self-pay | Admitting: Pharmacy Technician

## 2024-04-18 ENCOUNTER — Other Ambulatory Visit: Payer: Self-pay

## 2024-04-18 ENCOUNTER — Other Ambulatory Visit (HOSPITAL_COMMUNITY): Payer: Self-pay

## 2024-04-18 DIAGNOSIS — I6629 Occlusion and stenosis of unspecified posterior cerebral artery: Secondary | ICD-10-CM

## 2024-04-18 NOTE — Progress Notes (Signed)
 Initial fill has been completed in [place].

## 2024-04-18 NOTE — Telephone Encounter (Signed)
 Thank you :)

## 2024-04-18 NOTE — Telephone Encounter (Signed)
 Morning! Delivery has been set for 7.15.25.

## 2024-04-18 NOTE — Progress Notes (Signed)
 Called patient gave results and sent referral to Neuro surgery

## 2024-04-18 NOTE — Progress Notes (Signed)
 Specialty Pharmacy Initial Fill Coordination Note  Monica [name] is a 68 y.o. female contacted today regarding initial fill of specialty medication(s) IncobotulinumtoxinA  (Xeomin )   Patient requested Courier to Provider Office   Delivery date: 04/24/24   Verified address: [facility]  [address] [place] [address] [place], [place], [address]   Medication will be filled on 7.14.25.   Patient is aware of $0 copayment.

## 2024-04-18 NOTE — Telephone Encounter (Signed)
 Patient has been scheduled and ready for onboard ing at [facility]

## 2024-04-19 ENCOUNTER — Ambulatory Visit: Admitting: Neurosurgery

## 2024-04-19 ENCOUNTER — Encounter: Payer: Self-pay | Admitting: Neurosurgery

## 2024-04-19 VITALS — BP 120/82 | Ht 64.0 in | Wt 130.0 lb

## 2024-04-19 DIAGNOSIS — I672 Cerebral atherosclerosis: Secondary | ICD-10-CM

## 2024-04-19 NOTE — Progress Notes (Signed)
 ASSESSMENT: 68 year old lady with a past medical history significant for rheumatoid arthritis for which she initially was on methotrexate but now on Humira .  In 2000 and 07/2010 she had occipital neuralgia for which workup was done.  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  This was evaluated by Dr. Norleen Dale Blush, neurosurgeon at [facility] who comforted her that this was not something to worry about.  Recently she started developing twitching of her right face and for this she went and saw her rheumatologist.  She was subsequently referred to Dr.Tat, neurologist, who obtained cerebral imaging with an MRI as well as an MRA.  Patient was subsequently referred to me for evaluation.  81 mg of aspirin was recommended  Patient is accompanied by her husband.  She is a mother of 2 grown children and a grandmother of 6.   PLAN: I reviewed the imaging with her and her husband and I went over the natural history of disease with them and told them what intracranial atherosclerotic disease is.  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  Oftentimes, a stenosis at this region is congenital because of presence of a so-called fetal PCA in which the ipsilateral posterior circulation is supplied through the carotid artery.  Firstly, I do not believe that her facial twitching [she had a video taking of herself] is related to this.  She is going to follow-up with the neurology team for this.  Secondly, I think that what we need to do is obtain the old imaging from [facility] to see whether this lesion was present at that time.  They reports do not mention this however this may have been overlooked as it was considered to be congenital.  I would also like to get a dedicated CT angiogram of the head  because this would give us  a far better understanding of this stenosis because sometimes MR angiograms can overestimate the level of disease.  I recommended that she talks to her primary care doctor and get the comprehensive treatment for intracranial atherosclerotic disease with a statin in addition to the aspirin that has already been recommended.  I went over all the risks and benefits with them and I will see them back in a few weeks after I have retrieved the old images from [facility]. From my standpoint she is okay to get Botox injections for hemifacial spasm.    Social History   Socioeconomic History   Marital status: Married    Spouse name: Not on file   Number of children: Not on file   Years of education: Not on file   Highest education level: Not on file  Occupational History   Occupation: retired    Comment: bookkeeper for [employer]  Tobacco Use   Smoking status: Never    Passive exposure: Never   Smokeless tobacco: Never  Vaping Use   Vaping status: Never Used  Substance and Sexual Activity   Alcohol use: Not Currently   Drug use: Never   Sexual activity: Not Currently    Birth control/protection: Post-menopausal    Comment: First IC >16, Partners <5, No h/o STDs  Other Topics Concern   Not on file  Social History Narrative   Right handed    Social Drivers of Health  Financial Resource Strain: Not on file  Food Insecurity: Not on file  Transportation Needs: Not on file  Physical Activity: Not on file  Stress: Not on file  Social Connections: Not on file  Intimate Partner Violence: Not on file    Family History  Problem Relation Age of Onset   Alzheimer's disease Mother    Osteoarthritis Mother    Hypertension Sister    Alcohol abuse Sister    High Cholesterol Sister    Heart disease Brother        open heart surgery   Hyperlipidemia Brother    Heart attack Brother    Heart attack Brother    Heart disease Brother     Ulcers Brother        multiple bleeding ulcers   Healthy Son    Healthy Son     No Known Allergies  Past Medical History:  Diagnosis Date   Arthritis    Fibromyalgia    Hypertension    Macular degeneration    Left eye   Osteopenia 05/2018   T score -2.3 FRAX 11% / 1.8% overall stable from prior DEXA   RA (rheumatoid arthritis) (HCC)    Raynaud disease    Reiters syndrome    Sleep disturbance    Vaginal dryness     Past Surgical History:  Procedure Laterality Date   BREAST SURGERY     LEFT BR LUMP-Benign   COLONOSCOPY N/A 08/04/2018   Procedure: COLONOSCOPY;  Surgeon: Harvey Margo CROME, MD;  [facility];  Service: Endoscopy;  Laterality: N/A;  8:30   COLONOSCOPY N/A 02/15/2024   Procedure: COLONOSCOPY;  Surgeon: Cindie Carlin POUR, DO;  Location: [facility];  Service: Endoscopy;  Laterality: N/A;  11:00am, asa 2   POLYPECTOMY  08/04/2018   Procedure: POLYPECTOMY;  Surgeon: Harvey Margo CROME, MD;  Location: [facility];  Service: Endoscopy;;    Physical Exam HENT:     Head: Normocephalic.     Nose: Nose normal.  Eyes:     Pupils: Pupils are equal, round, and reactive to light.  Cardiovascular:     Rate and Rhythm: Normal rate.  Pulmonary:     Effort: Pulmonary effort is normal.  Abdominal:     General: Abdomen is flat.  Musculoskeletal:     Cervical back: Normal range of motion.  Neurological:     General: No focal deficit present.     Mental Status: She is alert.

## 2024-04-23 ENCOUNTER — Other Ambulatory Visit: Payer: Self-pay | Admitting: Physician Assistant

## 2024-04-23 DIAGNOSIS — I7300 Raynaud's syndrome without gangrene: Secondary | ICD-10-CM

## 2024-04-23 DIAGNOSIS — M1990 Unspecified osteoarthritis, unspecified site: Secondary | ICD-10-CM

## 2024-04-23 DIAGNOSIS — Z79899 Other long term (current) drug therapy: Secondary | ICD-10-CM

## 2024-04-24 NOTE — Telephone Encounter (Signed)
 Last Fill: 02/07/2024  Labs: 03/19/2024 CBC and CMP are normal.   TB Gold: 05/11/2023 Neg    Next Visit: 09/18/2024  Last Visit: 03/13/2024  DX: Inflammatory arthritis   Current Dose per office note 03/13/2024: Humira  40 mg sq injections every 14 days   Okay to refill Humira ?

## 2024-04-27 ENCOUNTER — Ambulatory Visit: Admitting: Neurology

## 2024-04-27 DIAGNOSIS — G245 Blepharospasm: Secondary | ICD-10-CM | POA: Diagnosis not present

## 2024-04-27 DIAGNOSIS — G5131 Clonic hemifacial spasm, right: Secondary | ICD-10-CM

## 2024-04-27 MED ORDER — INCOBOTULINUMTOXINA 50 UNITS IM SOLR
50.0000 [IU] | INTRAMUSCULAR | Status: AC
  Administered 2024-04-27: 25 [IU] via INTRAMUSCULAR

## 2024-04-27 NOTE — Procedures (Signed)
 [facility]   Procedure Note Botox  Attending: Dr. Asberry Oliana Gowens  Preoperative Diagnosis(es): hemifacial spasm/Blepharospasm    Consent obtained from: The patient Benefits discussed included, but were not limited to ability to open eyes and therefore see better.  Risk discussed included, but were not limited pain and discomfort, bleeding, bruising, excessive weakness, venous thrombosis, muscle atrophy and drooping of eyelids.  A copy of the patient medication guide was given to the patient which explains the blackbox warning.  Patients identity and treatment sites confirmed Yes.  .  Details of Procedure: Skin was cleaned with alcohol.  A 30 gauge, 1/2 inch needle was introduced to the target muscle.  Prior to injection, the needle plunger was aspirated to make sure the needle was not within a blood vessel.  There was no blood retrieved on aspiration.    Following is a summary of the muscles injected  And the amount of incobotulinumtoxin A used:   Dilution 0.9% preservative free saline mixed with 50 units incobotulinumtoxin A to make 5 U per 0.1cc (1 cc of saline used per 50 U incobotulinumtoxin A)  Injections  Location Left  Right Units  Upper lateral orbicularis oculi   2.5 2.5  Upper medial orbicularis oculi      Lateral orbicularis oculi  5.0 5.0 10  Lower lateral orbicularis oculi   2.5 2.5  Lower medial orbicularis oculi  2.5 2.5  nasalis  2.5 2.5  zygomaticus  2.5x2 5  TOTAL UNITS:   25   Agent: Incobotulinumtoxin A 1 vials of Botox were used, 50 units, diluted as above   Total injected (Units): 25  Total wasted (Units): 25   Pt tolerated procedure well without complications.   Reinjection is anticipated in 3 months.

## 2024-05-01 ENCOUNTER — Telehealth: Payer: Self-pay | Admitting: Neurology

## 2024-05-01 NOTE — Telephone Encounter (Signed)
 Pt. Has been having issues after Botox injection, Chills shoulder blades ache, throat issues, been taking tylenol  and she also took injection of Humara prior to botox and wants to know if this a reaction to both injections

## 2024-05-02 NOTE — Telephone Encounter (Signed)
 Called patient and she agreed she thinks taking the Humira  and the Botox on the same week may not have been best. She is feeling like she has a cold now

## 2024-05-03 ENCOUNTER — Other Ambulatory Visit: Payer: Self-pay

## 2024-05-03 ENCOUNTER — Telehealth: Payer: Self-pay | Admitting: Neurosurgery

## 2024-05-03 DIAGNOSIS — Z049 Encounter for examination and observation for unspecified reason: Secondary | ICD-10-CM

## 2024-05-03 NOTE — Telephone Encounter (Signed)
 I called patient and made her aware.

## 2024-05-03 NOTE — Telephone Encounter (Signed)
 Lab order placed to be done at [facility]. Please let her know she can go at her convince to a [facility].They should have the order.

## 2024-05-03 NOTE — Telephone Encounter (Signed)
 Patient called to reschedule follow up with Dr Janjua from 05/08/24 to 05/29/24 because she is sick. She called to reschedule her CT as well and they told her she would need to have lab work that was less than 63 weeks old for creatine and her last labs were 03/19/24 so she would need more. Please advise

## 2024-05-07 ENCOUNTER — Other Ambulatory Visit: Payer: Self-pay

## 2024-05-07 ENCOUNTER — Ambulatory Visit (HOSPITAL_COMMUNITY)

## 2024-05-07 DIAGNOSIS — Z79899 Other long term (current) drug therapy: Secondary | ICD-10-CM

## 2024-05-07 DIAGNOSIS — Z9225 Personal history of immunosupression therapy: Secondary | ICD-10-CM

## 2024-05-07 DIAGNOSIS — Z111 Encounter for screening for respiratory tuberculosis: Secondary | ICD-10-CM

## 2024-05-07 NOTE — Addendum Note (Signed)
 Addended by: Kima Malenfant M on: 05/07/2024 10:07 AM   Modules accepted: Orders

## 2024-05-08 ENCOUNTER — Ambulatory Visit: Admitting: Neurosurgery

## 2024-05-08 DIAGNOSIS — Z79899 Other long term (current) drug therapy: Secondary | ICD-10-CM | POA: Diagnosis not present

## 2024-05-08 DIAGNOSIS — Z9225 Personal history of immunosupression therapy: Secondary | ICD-10-CM | POA: Diagnosis not present

## 2024-05-08 DIAGNOSIS — Z111 Encounter for screening for respiratory tuberculosis: Secondary | ICD-10-CM | POA: Diagnosis not present

## 2024-05-09 ENCOUNTER — Encounter: Payer: Self-pay | Admitting: Neurology

## 2024-05-09 ENCOUNTER — Ambulatory Visit: Payer: Self-pay | Admitting: Physician Assistant

## 2024-05-09 LAB — CMP14+EGFR
ALT: 38 IU/L — ABNORMAL HIGH (ref 0–32)
AST: 34 IU/L (ref 0–40)
Albumin: 4.3 g/dL (ref 3.9–4.9)
Alkaline Phosphatase: 101 IU/L (ref 44–121)
BUN/Creatinine Ratio: 16 (ref 12–28)
BUN: 14 mg/dL (ref 8–27)
Bilirubin Total: 0.4 mg/dL (ref 0.0–1.2)
CO2: 22 mmol/L (ref 20–29)
Calcium: 9.3 mg/dL (ref 8.7–10.3)
Chloride: 98 mmol/L (ref 96–106)
Creatinine, Ser: 0.87 mg/dL (ref 0.57–1.00)
Globulin, Total: 2.7 g/dL (ref 1.5–4.5)
Glucose: 134 mg/dL — ABNORMAL HIGH (ref 70–99)
Potassium: 4 mmol/L (ref 3.5–5.2)
Sodium: 138 mmol/L (ref 134–144)
Total Protein: 7 g/dL (ref 6.0–8.5)
eGFR: 73 mL/min/1.73 (ref >59)

## 2024-05-09 LAB — CBC WITH DIFFERENTIAL/PLATELET
Basophils Absolute: 0 x10E3/uL (ref 0.0–0.2)
Basos: 0 %
EOS (ABSOLUTE): 0.3 x10E3/uL (ref 0.0–0.4)
Eos: 3 %
Hematocrit: 44.3 % (ref 34.0–46.6)
Hemoglobin: 14.4 g/dL (ref 11.1–15.9)
Immature Grans (Abs): 0 x10E3/uL (ref 0.0–0.1)
Immature Granulocytes: 0 %
Lymphocytes Absolute: 2.5 x10E3/uL (ref 0.7–3.1)
Lymphs: 26 %
MCH: 30.1 pg (ref 26.6–33.0)
MCHC: 32.5 g/dL (ref 31.5–35.7)
MCV: 93 fL (ref 79–97)
Monocytes Absolute: 0.6 x10E3/uL (ref 0.1–0.9)
Monocytes: 7 %
Neutrophils Absolute: 5.9 x10E3/uL (ref 1.4–7.0)
Neutrophils: 64 %
Platelets: 335 x10E3/uL (ref 150–450)
RBC: 4.79 x10E6/uL (ref 3.77–5.28)
RDW: 12.9 % (ref 11.7–15.4)
WBC: 9.3 x10E3/uL (ref 3.4–10.8)

## 2024-05-09 NOTE — Progress Notes (Signed)
 Glucose is 134.  ALT is elevated-38-please clarify if she has been taking any tylenol ? Alcohol use? Any other medication changes? Rest of CMP Wnl CBC WNL

## 2024-05-11 LAB — QUANTIFERON-TB GOLD PLUS
QuantiFERON Mitogen Value: >10 [IU]/mL
QuantiFERON Nil Value: 0.04 [IU]/mL
QuantiFERON TB1 Ag Value: 0.05 [IU]/mL
QuantiFERON TB2 Ag Value: 0.05 [IU]/mL

## 2024-05-14 ENCOUNTER — Ambulatory Visit: Payer: Self-pay | Admitting: Physician Assistant

## 2024-05-18 ENCOUNTER — Ambulatory Visit (HOSPITAL_COMMUNITY)
Admission: RE | Admit: 2024-05-18 | Discharge: 2024-05-18 | Disposition: A | Discharge status: Home / Self Care | Source: Ambulatory Visit | Attending: Neurosurgery | Admitting: Neurosurgery

## 2024-05-18 DIAGNOSIS — I672 Cerebral atherosclerosis: Secondary | ICD-10-CM | POA: Insufficient documentation

## 2024-05-18 DIAGNOSIS — I671 Cerebral aneurysm, nonruptured: Secondary | ICD-10-CM | POA: Diagnosis not present

## 2024-05-18 MED ORDER — IOHEXOL 350 MG/ML SOLN
75.0000 mL | Freq: Once | INTRAVENOUS | Status: AC | PRN
  Administered 2024-05-18: 75 mL via INTRAVENOUS

## 2024-05-21 ENCOUNTER — Other Ambulatory Visit: Payer: Self-pay

## 2024-05-21 ENCOUNTER — Inpatient Hospital Stay
Admission: RE | Admit: 2024-05-21 | Discharge: 2024-05-21 | Disposition: A | Discharge status: Home / Self Care | Payer: Self-pay | Source: Ambulatory Visit | Attending: Neurosurgery | Admitting: Neurosurgery

## 2024-05-21 DIAGNOSIS — Z049 Encounter for examination and observation for unspecified reason: Secondary | ICD-10-CM

## 2024-05-22 ENCOUNTER — Ambulatory Visit: Admitting: Neurosurgery

## 2024-05-22 ENCOUNTER — Encounter: Payer: Self-pay | Admitting: Neurosurgery

## 2024-05-22 VITALS — BP 126/87 | HR 88 | Ht 64.0 in | Wt 130.0 lb

## 2024-05-22 DIAGNOSIS — I672 Cerebral atherosclerosis: Secondary | ICD-10-CM

## 2024-05-22 NOTE — Progress Notes (Signed)
 67 year old lady with hemifacial spasms who for the workup underwent an MR angiogram and this suggested a left-sided P1-P2 stenosis.  Patient remembered that she had a similar study done 15 years ago at [facility] and we retrieve these images.  I put them side-by-side today and reviewed them with her and her husband.  This shows that there is indeed a new development of a left beep 1-P2 stenosis which was not there in 2010.  I reviewed the results of intracranial atherosclerosis studies with her and her husband and I shared with them that all things being equal, the treatment of intracranial atherosclerotic disease results and equally lower future stroke risk in patients who are treated with best medical management versus patients who have stenting.  The difference is that the patients who had stenting have a considerably higher treatment risk than the ones were treated with medication.  Therefore I recommended that she continues on aspirin and a statin and we decided to see each other back in 1 year with a CT angiogram.  If anything changes before that, she will let me know.

## 2024-05-24 DIAGNOSIS — D3112 Benign neoplasm of left cornea: Secondary | ICD-10-CM | POA: Diagnosis not present

## 2024-05-28 NOTE — Progress Notes (Unsigned)
 Assessment/Plan:   ***Hemifacial spasm, right/blepharospasm             - Discussed nature and pathophysiology.  Discussed that this is often idiopathic, but we will do MRI/MRA of the brain to make sure that no tortuous blood vessels around cranial nerve VII.  If not, we discussed treatment with toxin.  We discussed risk, benefits, side effects, including blackbox warnings.  We will try to get approval.             -mild right face droop (pt thinks long term).  Nasolabial folds are symmetric, but she has just a tiny bit of asymmetry.  She had first round of Botox April 27, 2024.  She had a very small dose, but thought perhaps that this exacerbated the issue.   Left PCA stenosis  - Following with Dr. Janjua  - Treating medically with aspirin and statin therapy. Subjective:   Monica [name] was seen today in follow up for ***hemifacial spasm/blepharospasm, status post Botox injections April 27, 2024.[name]  She emailed me about 2 weeks later stating that she had noticed that her mouth droops a little bit more than usual.  She had previously told me that she had long-term slight right facial droop and her husband had brought in some pictures in that regard.  We discussed that if it was from the Botox, we would have to just wait for that to wear off, although a very small amount of Botox was used, especially in the lower face (only had 2.5 mg in the nasalis).  She reports that **.  Separately, we did do an MRI brain and MRA brain since our last visit.  MRI brain was unremarkable.  MRA brain demonstrated severe stenosis of the left PCA P1/P2 junction.  She saw Dr. Janjua.  He was able to compare these with films that she had in 2010, and this development was new compared to 2010.  They opted for medical treatment with aspirin and a statin.   PREVIOUS MEDICATIONS: {Parkinson's RX:18200}  CURRENT MEDICATIONS:  Outpatient Encounter Medications as of 05/29/2024  Medication Sig   Cholecalciferol (VITAMIN D )  2000 units tablet Take 2,000 Units by mouth daily.   diclofenac  Sodium (VOLTAREN ) 1 % GEL Apply 2 g topically 4 (four) times daily.   HUMIRA , 2 PEN, 40 MG/0.4ML pen INJECT 1 PEN UNDER THE SKIN EVERY 14 DAYS   incobotulinumtoxinA  (XEOMIN ) 50 units SOLR injection INJECT 50 UNITS INTO THE HEAD AND NECK PER DR. Jereline Ticer AT THE DOCTORS OFFICE (Patient not taking: Reported on 05/22/2024)   losartan (COZAAR) 50 MG tablet Take 50 mg by mouth daily.   magnesium oxide (MAG-OX) 400 (240 Mg) MG tablet Take 400 mg by mouth daily.   sulfaSALAzine  (AZULFIDINE ) 500 MG tablet Take 2 tablets (1,000 mg total) by mouth 2 (two) times daily.   Facility-Administered Encounter Medications as of 05/29/2024  Medication   incobotulinumtoxinA  (XEOMIN ) 50 units injection 50 Units     Objective:   PHYSICAL EXAMINATION:    VITALS:  There were no vitals filed for this visit.  GEN:  The patient appears stated age and is in NAD. HEENT:  Normocephalic, atraumatic.  The mucous membranes are moist. The superficial temporal arteries are without ropiness or tenderness. CV:  RRR Lungs:  CTAB Neck/HEME:  There are no carotid bruits bilaterally.  Neurological examination:  Orientation: The patient is alert and oriented x3. Cranial nerves: There is good facial symmetry.The speech is fluent and clear. Soft palate rises symmetrically  and there is no tongue deviation. Hearing is intact to conversational tone. Sensation: Sensation is intact to light touch throughout Motor: Strength is at least antigravity x4.  Movement examination: Tone: There is normal tone in the UE/LE Abnormal movements:  no tremor.  No myoclonus.  No asterixis.   Coordination:  There is no decremation with RAM's. Gait and Station: The patient has no difficulty arising out of a deep-seated chair without the use of the hands. The patient's stride length is good.   Abnormal movements: There is facial twitching around right orbicularis oculi, superior and inferior  and a little bit and rarely around zygomaticus on the right.    ***Total time spent on today's visit was *** minutes, including both face-to-face time and nonface-to-face time.  Time included that spent on review of records (prior notes available to me/labs/imaging if pertinent), discussing treatment and goals, answering patient's questions and coordinating care.  Cc:  [name], NP

## 2024-05-29 ENCOUNTER — Ambulatory Visit: Admitting: Neurosurgery

## 2024-05-29 ENCOUNTER — Encounter: Payer: Self-pay | Admitting: Neurology

## 2024-05-29 ENCOUNTER — Ambulatory Visit: Admitting: Neurology

## 2024-05-29 VITALS — BP 126/82 | HR 72 | Ht 64.0 in | Wt 131.4 lb

## 2024-05-29 DIAGNOSIS — G245 Blepharospasm: Secondary | ICD-10-CM

## 2024-05-29 DIAGNOSIS — H43391 Other vitreous opacities, right eye: Secondary | ICD-10-CM | POA: Diagnosis not present

## 2024-05-29 DIAGNOSIS — H43822 Vitreomacular adhesion, left eye: Secondary | ICD-10-CM | POA: Diagnosis not present

## 2024-05-29 DIAGNOSIS — H43813 Vitreous degeneration, bilateral: Secondary | ICD-10-CM | POA: Diagnosis not present

## 2024-05-29 DIAGNOSIS — H2513 Age-related nuclear cataract, bilateral: Secondary | ICD-10-CM | POA: Diagnosis not present

## 2024-05-29 DIAGNOSIS — G5131 Clonic hemifacial spasm, right: Secondary | ICD-10-CM | POA: Diagnosis not present

## 2024-05-29 DIAGNOSIS — H35362 Drusen (degenerative) of macula, left eye: Secondary | ICD-10-CM | POA: Diagnosis not present

## 2024-06-06 DIAGNOSIS — Z79899 Other long term (current) drug therapy: Secondary | ICD-10-CM | POA: Diagnosis not present

## 2024-06-06 DIAGNOSIS — E785 Hyperlipidemia, unspecified: Secondary | ICD-10-CM | POA: Diagnosis not present

## 2024-06-06 DIAGNOSIS — J302 Other seasonal allergic rhinitis: Secondary | ICD-10-CM | POA: Diagnosis not present

## 2024-06-29 ENCOUNTER — Other Ambulatory Visit: Payer: Self-pay

## 2024-07-23 ENCOUNTER — Other Ambulatory Visit: Payer: Self-pay

## 2024-07-23 NOTE — Progress Notes (Signed)
 Specialty Pharmacy Refill Coordination Note  Monica [name] is a 68 y.o. female assessed today regarding refills of clinic administered specialty medication(s) IncobotulinumtoxinA  (XEOMIN )   Clinic requested Courier to Provider Office   Delivery date: 08/06/24   Injection date: 08/09/24  Verified address: [facility]  [address] [place] [address] [place], [place], [address]   Medication will be filled on 08/03/24.

## 2024-07-27 ENCOUNTER — Other Ambulatory Visit (HOSPITAL_COMMUNITY): Payer: Self-pay | Admitting: Nurse Practitioner

## 2024-07-27 DIAGNOSIS — Z1231 Encounter for screening mammogram for malignant neoplasm of breast: Secondary | ICD-10-CM

## 2024-08-02 ENCOUNTER — Encounter: Payer: Self-pay | Admitting: Nurse Practitioner

## 2024-08-02 ENCOUNTER — Ambulatory Visit (HOSPITAL_COMMUNITY)
Admission: RE | Admit: 2024-08-02 | Discharge: 2024-08-02 | Disposition: A | Discharge status: Home / Self Care | Source: Ambulatory Visit | Attending: Nurse Practitioner | Admitting: Nurse Practitioner

## 2024-08-02 ENCOUNTER — Ambulatory Visit: Admitting: Nurse Practitioner

## 2024-08-02 VITALS — BP 122/76 | HR 96 | Ht 63.5 in | Wt 128.0 lb

## 2024-08-02 DIAGNOSIS — N952 Postmenopausal atrophic vaginitis: Secondary | ICD-10-CM | POA: Diagnosis not present

## 2024-08-02 DIAGNOSIS — L900 Lichen sclerosus et atrophicus: Secondary | ICD-10-CM

## 2024-08-02 DIAGNOSIS — Z1231 Encounter for screening mammogram for malignant neoplasm of breast: Secondary | ICD-10-CM | POA: Diagnosis not present

## 2024-08-02 DIAGNOSIS — N941 Unspecified dyspareunia: Secondary | ICD-10-CM

## 2024-08-02 DIAGNOSIS — Z780 Asymptomatic menopausal state: Secondary | ICD-10-CM

## 2024-08-02 DIAGNOSIS — Z01419 Encounter for gynecological examination (general) (routine) without abnormal findings: Secondary | ICD-10-CM

## 2024-08-02 DIAGNOSIS — M810 Age-related osteoporosis without current pathological fracture: Secondary | ICD-10-CM

## 2024-08-02 MED ORDER — ESTRADIOL 0.01 % VA CREA: 1.0000 g | TOPICAL_CREAM | VAGINAL | 1 refills | Status: AC

## 2024-08-02 MED ORDER — CLOBETASOL PROPIONATE 0.05 % EX OINT: 1.0000 | TOPICAL_OINTMENT | CUTANEOUS | 0 refills | Status: AC

## 2024-08-02 NOTE — Progress Notes (Signed)
 Monica [name] 31-Jul-1956 [id number]   History:  68 y.o. [id number] presents for annual exam. Postmenopausal - no HRT, no bleeding. Has not been sexually active in a few years due to pain with intercourse. Prescribed vaginal estrogen a couple of years ago but never started due to fear of breast cancer. H/O lichen sclerosus, using Clobetasol  with good management.   Normal pap history. On Humira  for RA.   Gynecologic History No LMP recorded. Patient is postmenopausal.   Contraception/Family planning: post menopausal status Sexually active: Yes  Health Maintenance Last Pap: 06/24/2022. Results were: Normal neg HPV Last mammogram: 08/02/2024 (today). Results were: Pending Last colonoscopy: 02/15/2024. Results were: Normal, 10-year recall Last Dexa: 02/25/2021. Results were: T-score -2.5  Past medical history, past surgical history, family history and social history were all reviewed and documented in the EPIC chart. Married. Retired. 2 sons, 6 grandchildren ages 68-15.   ROS:  A ROS was performed and pertinent positives and negatives are included.  Exam:  Vitals:   08/02/24 1416  BP: 122/76  Pulse: 96  SpO2: 98%  Weight: 128 lb (58.1 kg)  Height: 5' 3.5 (1.613 m)     Body mass index is 22.32 kg/m.  General appearance:  Normal Thyroid:  Symmetrical, normal in size, without palpable masses or nodularity. Respiratory  Auscultation:  Clear without wheezing or rhonchi Cardiovascular  Auscultation:  Regular rate, without rubs, murmurs or gallops  Edema/varicosities:  Not grossly evident Abdominal  Soft,nontender, without masses, guarding or rebound.  Liver/spleen:  No organomegaly noted  Hernia:  None appreciated  Skin  Inspection:  Grossly normal Breasts: Examined lying and sitting.   Right: Without masses, retractions, nipple discharge or axillary adenopathy.   Left: Without masses, retractions, nipple discharge or axillary adenopathy. Pelvic: External genitalia:  no  lesions              Urethra:  normal appearing urethra with no masses, tenderness or lesions              Bartholins and Skenes: normal                 Vagina: normal appearing vagina with normal color and discharge, no lesions. Atrophic changes              Cervix: no lesions Bimanual Exam:  Uterus:  no masses or tenderness              Adnexa: no mass, fullness, tenderness              Rectovaginal: Deferred              Anus:  normal, no lesions  Dereck Keas, CMA present as chaperone.   Assessment/Plan:  68 y.o. [id number] for annual exam.   Encounter for breast and pelvic examination - Education provided on SBEs, importance of preventative screenings, current guidelines, high calcium diet, regular exercise, and multivitamin daily. Labs with PCP and rheumatologist.    Postmenopausal - No HRT, no bleeding.  Age-related osteoporosis without current pathological fracture - Plan: DG Bone Density. T-score -2.5 in May 2022. Continue Vitamin D  + Calcium supplement and exercise. She walks 2-3 miles most days.   Vaginal atrophy - Plan: estradiol  (ESTRACE ) 0.01 % CREA vaginal cream twice weekly.   Dyspareunia in female - Plan: estradiol  (ESTRACE ) 0.01 % CREA vaginal cream twice weekly. Discussed safety of vaginal estrogen. Last visit we discussed vaginal lubricants, vaginal dilators and PFPT as well.  Lichen sclerosus - Plan: clobetasol  ointment (TEMOVATE )  0.05 % twice weekly. Good management.   Screening for cervical cancer - Normal Pap history. No longer screening per guidelines.   Screening for breast cancer - Normal mammogram history.  Continue annual screenings.  Normal breast exam today.  Screening for colon cancer - 02/2024 colonoscopy. Will repeat at GI's recommended interval.   Return in about 1 year (around 08/02/2025) for Med follow up.      Monica DELENA Shutter DNP, 2:52 PM 08/02/2024

## 2024-08-03 ENCOUNTER — Ambulatory Visit: Admitting: Neurology

## 2024-08-03 ENCOUNTER — Other Ambulatory Visit: Payer: Self-pay

## 2024-08-06 ENCOUNTER — Other Ambulatory Visit: Payer: Self-pay | Admitting: Physician Assistant

## 2024-08-06 DIAGNOSIS — M1380 Other specified arthritis, unspecified site: Secondary | ICD-10-CM

## 2024-08-06 DIAGNOSIS — I7300 Raynaud's syndrome without gangrene: Secondary | ICD-10-CM

## 2024-08-06 DIAGNOSIS — Z79899 Other long term (current) drug therapy: Secondary | ICD-10-CM

## 2024-08-07 ENCOUNTER — Ambulatory Visit (HOSPITAL_COMMUNITY)
Admission: RE | Admit: 2024-08-07 | Discharge: 2024-08-07 | Disposition: A | Discharge status: Home / Self Care | Source: Ambulatory Visit | Attending: Nurse Practitioner | Admitting: Nurse Practitioner

## 2024-08-07 DIAGNOSIS — M810 Age-related osteoporosis without current pathological fracture: Secondary | ICD-10-CM | POA: Insufficient documentation

## 2024-08-07 DIAGNOSIS — Z780 Asymptomatic menopausal state: Secondary | ICD-10-CM | POA: Diagnosis not present

## 2024-08-07 NOTE — Telephone Encounter (Signed)
 Last Fill: 04/24/2024  Labs: 05/08/2024 Glucose is 134. ALT is elevated-38 Rest of CMP Wnl CBC WNL  TB Gold: 05/08/2024 Neg    Next Visit: 09/18/2024  Last Visit: 03/13/2024  DX: Inflammatory arthritis   Current Dose per office note 03/13/2024:  Humira  40 mg sq injections every 14 days   Okay to refill Humira ?

## 2024-08-08 ENCOUNTER — Ambulatory Visit: Payer: Self-pay | Admitting: Nurse Practitioner

## 2024-08-10 ENCOUNTER — Ambulatory Visit: Admitting: Neurology

## 2024-08-10 DIAGNOSIS — G245 Blepharospasm: Secondary | ICD-10-CM | POA: Diagnosis not present

## 2024-08-10 DIAGNOSIS — G5131 Clonic hemifacial spasm, right: Secondary | ICD-10-CM | POA: Diagnosis not present

## 2024-08-10 MED ORDER — INCOBOTULINUMTOXINA 50 UNITS IM SOLR
50.0000 [IU] | INTRAMUSCULAR | Status: AC
  Administered 2024-08-10: 30 [IU] via INTRAMUSCULAR

## 2024-08-10 NOTE — Procedures (Signed)
 [facility]   Procedure Note Botox  Attending: Dr. Asberry Kandice Schmelter  Preoperative Diagnosis(es): hemifacial spasm/Blepharospasm  Toxin type:  xeomin  Results:  working well right now but altering injection pattern some b/c of some mild c/o  Consent obtained from: The patient Benefits discussed included, but were not limited to ability to open eyes and therefore see better.  Risk discussed included, but were not limited pain and discomfort, bleeding, bruising, excessive weakness, venous thrombosis, muscle atrophy and drooping of eyelids.  A copy of the patient medication guide was given to the patient which explains the blackbox warning.  Patients identity and treatment sites confirmed Yes.  .  Details of Procedure: Skin was cleaned with alcohol.  A 30 gauge, 1/2 inch needle was introduced to the target muscle.  Prior to injection, the needle plunger was aspirated to make sure the needle was not within a blood vessel.  There was no blood retrieved on aspiration.    Following is a summary of the muscles injected  And the amount of incobotulinumtoxin A used:   Dilution 0.9% preservative free saline mixed with 50 units incobotulinumtoxin A to make 5 U per 0.1cc (1 cc of saline used per 50 U incobotulinumtoxin A)  Injections  Location Left  Right Units  Upper lateral orbicularis oculi - pretarsal  2.5 2.5  Upper medial orbicularis oculi -pretarsal  2.5 2.5  Lateral orbicularis oculi: upper/middle/lower 2.5/5.0/2.5 5.0 (middle only) 15  Lower lateral orbicularis oculi   2.5 2.5  Lower medial orbicularis oculi     nasalis     zygomaticus  2.5x3 7.5  TOTAL UNITS:   30   Agent: Incobotulinumtoxin A 1 vials of Botox were used, 50 units, diluted as above   Total injected (Units): 30  Total wasted (Units): 20   Pt tolerated procedure well without complications.   Reinjection is anticipated in 3 months.

## 2024-08-15 DIAGNOSIS — L573 Poikiloderma of Civatte: Secondary | ICD-10-CM | POA: Diagnosis not present

## 2024-08-15 DIAGNOSIS — L570 Actinic keratosis: Secondary | ICD-10-CM | POA: Diagnosis not present

## 2024-08-15 DIAGNOSIS — I781 Nevus, non-neoplastic: Secondary | ICD-10-CM | POA: Diagnosis not present

## 2024-08-24 ENCOUNTER — Telehealth: Payer: Self-pay | Admitting: Neurology

## 2024-08-24 NOTE — Telephone Encounter (Signed)
 Patient said she may have a few small spasms but doing much much better. She did have a small bruise but nothing bad

## 2024-08-24 NOTE — Telephone Encounter (Signed)
-----   Message from [place] Danaija Eskridge sent at 08/10/2024 12:28 PM EDT ----- Call patient and see how she did with this round of botox

## 2024-08-30 ENCOUNTER — Encounter: Payer: Self-pay | Admitting: Neurology

## 2024-09-05 NOTE — Progress Notes (Unsigned)
 Office Visit Note  Patient: Monica [name]             Date of Birth: [birth year]           MRN: [id number]             PCP: Shona Norleen PEDLAR, MD Referring: [name], NP Visit Date: 09/18/2024 Occupation: Data Unavailable  Subjective:  Pain in both hands   History of Present Illness: Monica [name] is a 68 y.o. female with history of inflammatory arthritis.  Patient remains on  Humira  40 mg sq injections every 14 days and sulfasalazine  500 mg 2 tablets by mouth twice daily. Patient states she will occasionally not take the full dose of sulfasalazine .  She has noticed some increased arthralgias and joint stiffness especially her in her lower back.  She attributes the increase in discomfort due to weather change and an increase in activity.  She has restarted exercises she learned at PT in the past.  She has not scheduled a follow up visit with Dr. Eldonna who previously performed epidural injections.  She has noticed intermittent pain and stiffness in both hands especially with repetitive activities.       Activities of Daily Living:  Patient reports morning stiffness for 45 minutes.   Patient Reports nocturnal pain.  Difficulty dressing/grooming: Denies Difficulty climbing stairs: Denies Difficulty getting out of chair: Denies Difficulty using hands for taps, buttons, cutlery, and/or writing: Reports  Review of Systems  Constitutional:  Positive for fatigue.  HENT:  Positive for mouth dryness. Negative for mouth sores.   Eyes:  Positive for dryness.  Respiratory:  Positive for shortness of breath.   Cardiovascular:  Negative for chest pain and palpitations.  Gastrointestinal:  Negative for blood in stool, constipation and diarrhea.  Endocrine: Negative for increased urination.  Genitourinary:  Negative for involuntary urination.  Musculoskeletal:  Positive for joint pain, joint pain and morning stiffness. Negative for gait problem, joint swelling, myalgias, muscle weakness,  muscle tenderness and myalgias.  Skin:  Positive for color change, hair loss and sensitivity to sunlight. Negative for rash.  Allergic/Immunologic: Negative for susceptible to infections.  Neurological:  Negative for dizziness and headaches.  Hematological:  Negative for swollen glands.  Psychiatric/Behavioral:  Positive for sleep disturbance. Negative for depressed mood. The patient is nervous/anxious.     PMFS History:  Patient Active Problem List   Diagnosis Date Noted   Rheumatoid arthritis (HCC) 12/12/2023   Special screening for malignant neoplasms, colon    High risk medication use 01/13/2017   History of iritis 01/13/2017   Essential hypertension 01/13/2017   Spondylosis of lumbar region without myelopathy or radiculopathy 01/13/2017   Vaginal dryness    Reiter's disease (HCC)    Raynaud disease    Fibromyalgia    Osteopenia     Past Medical History:  Diagnosis Date   Arthritis    Fibromyalgia    Hemifacial spasm    Hypertension    Macular degeneration    Left eye   Osteopenia 05/2018   T score -2.3 FRAX 11% / 1.8% overall stable from prior DEXA   RA (rheumatoid arthritis) (HCC)    Raynaud disease    Reiters syndrome    Sleep disturbance    Vaginal dryness     Family History  Problem Relation Age of Onset   Alzheimer's disease Mother    Osteoarthritis Mother    Hypertension Sister    Alcohol abuse Sister    High Cholesterol Sister  Heart disease Brother        open heart surgery   Hyperlipidemia Brother    Heart attack Brother    Heart attack Brother    Heart disease Brother    Ulcers Brother        multiple bleeding ulcers   Healthy Son    Healthy Son    Past Surgical History:  Procedure Laterality Date   BREAST SURGERY     LEFT BR LUMP-Benign   COLONOSCOPY N/A 08/04/2018   Procedure: COLONOSCOPY;  Surgeon: Harvey Margo CROME, MD;  Location: [facility];  Service: Endoscopy;  Laterality: N/A;  8:30   COLONOSCOPY N/A 02/15/2024   Procedure:  COLONOSCOPY;  Surgeon: Cindie Carlin POUR, DO;  Location: [facility];  Service: Endoscopy;  Laterality: N/A;  11:00am, asa 2   facial spasms Right    within the last 6 months   POLYPECTOMY  08/04/2018   Procedure: POLYPECTOMY;  Surgeon: Harvey Margo CROME, MD;  Location: [facility];  Service: Endoscopy;;   Social History   Tobacco Use   Smoking status: Never    Passive exposure: Never   Smokeless tobacco: Never  Vaping Use   Vaping status: Never Used  Substance Use Topics   Alcohol use: Not Currently   Drug use: Never   Social History   Social History Narrative   Right handed      Immunization History  Administered Date(s) Administered   Influenza,inj,Quad PF,6+ Mos 08/24/2012, 09/25/2013, 11/13/2014, 11/17/2017   Moderna Sars-Covid-2 Vaccination 12/05/2019, 01/02/2020, 08/30/2020     Objective: Vital Signs: BP 125/83   Pulse (!) 103   Temp 98.2 F (36.8 C)   Resp 14   Ht 5' 4 (1.626 m)   Wt 130 lb 9.6 oz (59.2 kg)   BMI 22.42 kg/m    Physical Exam Vitals and nursing note reviewed.  Constitutional:      Appearance: She is well-developed.  HENT:     Head: Normocephalic and atraumatic.  Eyes:     Conjunctiva/sclera: Conjunctivae normal.  Cardiovascular:     Rate and Rhythm: Normal rate and regular rhythm.     Heart sounds: Normal heart sounds.  Pulmonary:     Effort: Pulmonary effort is normal.     Breath sounds: Normal breath sounds.  Abdominal:     General: Bowel sounds are normal.     Palpations: Abdomen is soft.  Musculoskeletal:     Cervical back: Normal range of motion.  Lymphadenopathy:     Cervical: No cervical adenopathy.  Skin:    General: Skin is warm and dry.     Capillary Refill: Capillary refill takes less than 2 seconds.  Neurological:     Mental Status: She is alert and oriented to person, place, and time.  Psychiatric:        Behavior: Behavior normal.      Musculoskeletal Exam: C-spine has good ROM. Discomfort with mobility  of lumbar spine.  Shoulder joints, elbow joints, wrist joints, MCPs, PIPs, DIPs have good range of motion with no synovitis.  Complete fist formation bilaterally.  PIP and DIP thickening. Hip joints have good range of motion with no groin pain.  Knee joints have good range of motion no warmth or effusion.  Ankle joints have good range of motion no tenderness or joint swelling.     CDAI Exam: CDAI Score: -- Patient Global: --; Provider Global: -- Swollen: --; Tender: -- Joint Exam 09/18/2024   No joint exam has been documented  for this visit   There is currently no information documented on the homunculus. Go to the Rheumatology activity and complete the homunculus joint exam.  Investigation: No additional findings.  Imaging: No results found.   Recent Labs: Lab Results  Component Value Date   WBC 9.3 05/08/2024   HGB 14.4 05/08/2024   PLT 335 05/08/2024   NA 138 05/08/2024   K 4.0 05/08/2024   CL 98 05/08/2024   CO2 22 05/08/2024   GLUCOSE 134 (H) 05/08/2024   BUN 14 05/08/2024   CREATININE 0.87 05/08/2024   BILITOT 0.4 05/08/2024   ALKPHOS 101 05/08/2024   AST 34 05/08/2024   ALT 38 (H) 05/08/2024   PROT 7.0 05/08/2024   ALBUMIN 4.3 05/08/2024   CALCIUM 9.3 05/08/2024   GFRAA 84 10/22/2020   QFTBGOLDPLUS Negative 05/08/2024    Speciality Comments: TB gold neg 01/25/18 HIV, Hep, IG, SPEP neg 01/25/18  Procedures:  No procedures performed Allergies: Patient has no known allergies.   Assessment / Plan:     Visit Diagnoses: Inflammatory arthritis - Longstanding seronegative inflammatory arthritis and iritis: No synovitis or dactylitis noted on examination today.  Patient has noticed an increase acute exacerbation of discomfort in her lower back as well as some increased discomfort in both hands.  Patient attributes the increase in symptoms to weather change as well as an increase in activity decorating for Christmas.  Patient has started home exercises for her lower  back which have been helpful. She remains on Humira  40 mg subcutaneous injections once every 14 days and sulfasalazine  500 mg 2 tablets twice daily.  Patient states that she does not always take sulfasalazine  at the full dose.  Encourage patient to take sulfasalazine  as prescribed to see if this will help alleviate some of the discomfort she is experiencing.  She will notify us  if she develops any new or worsening symptoms.  She will follow-up in the office in 5 months or sooner if needed.  High risk medication use - Humira  40 mg sq injections every 14 days and sulfasalazine  500 mg 2 tablets by mouth twice daily. CBC and CMP updated on this morning at [facility].   TB gold negative on 05/08/24.   No recurrent infections.  Discussed the importance of holding humira  and sulfasalazine  if she develops signs or symptoms of an infection and to resume once the infection has completely cleared.    Raynaud's disease without gangrene: She has intermittent symptoms of raynaud's.  No digital ulcers.  No signs of sclerodactyly.    History of iritis: No recurrence.  No conjunctival injection.   Chronic left SI joint pain: Intermittent discomfort. Patient has been performing home exercises.  Spondylosis of lumbar region without myelopathy or radiculopathy: Patient recently had an exacerbation of discomfort involving the lower back.  She attributes increasing discomfort to having to be more physically active while decorating for Christmas.  She experiences intermittent symptoms of radiculopathy especially on the left side.  She has restarted physical therapy home exercises which seem to be helping to alleviate some of the discomfort.  Patient's last epidural injection was performed on 05/11/2021.  Age-related osteoporosis without current pathological fracture - DEXA updated on 02/25/21: [facility] spine BMD 0.771 with T score -2.5--ordered by gynecology.   DEXA performed on 08/07/2024: Lumbar spine BMD 0.836 with T-score -2.7.   Left femoral neck BMD 0.675 with T-score -2.6.  Right femoral neck BMD 0.713 with T-score -2.3. She is taking vitamin D  2000 units daily. No recent falls  or fractures.  Fibromyalgia: Patient experiences intermittent myalgias and muscle tenderness due to fibromyalgia.  She uses diclofenac  sodium gel as needed for pain relief and occasionally will take Tylenol  for pain relief.  Other medical conditions are listed as follows:   History of hypertension: BP was 125/83 today in the office.    Dyslipidemia  History of macular degeneration  Facial twitching  Orders: No orders of the defined types were placed in this encounter.  No orders of the defined types were placed in this encounter.   Follow-Up Instructions: Return in about 5 months (around 02/16/2025) for Inflammatory arthritis .   Waddell CHRISTELLA Craze, PA-C  Note - This record has been created using Dragon software.  Chart creation errors have been sought, but may not always  have been located. Such creation errors do not reflect on  the standard of medical care.

## 2024-09-17 ENCOUNTER — Other Ambulatory Visit: Payer: Self-pay

## 2024-09-17 DIAGNOSIS — Z79899 Other long term (current) drug therapy: Secondary | ICD-10-CM

## 2024-09-18 ENCOUNTER — Encounter: Payer: Self-pay | Admitting: Physician Assistant

## 2024-09-18 ENCOUNTER — Ambulatory Visit: Attending: Physician Assistant | Admitting: Physician Assistant

## 2024-09-18 VITALS — BP 125/83 | HR 103 | Temp 98.2°F | Resp 14 | Ht 64.0 in | Wt 130.6 lb

## 2024-09-18 DIAGNOSIS — I7300 Raynaud's syndrome without gangrene: Secondary | ICD-10-CM

## 2024-09-18 DIAGNOSIS — Z79899 Other long term (current) drug therapy: Secondary | ICD-10-CM

## 2024-09-18 DIAGNOSIS — M810 Age-related osteoporosis without current pathological fracture: Secondary | ICD-10-CM | POA: Diagnosis not present

## 2024-09-18 DIAGNOSIS — M1380 Other specified arthritis, unspecified site: Secondary | ICD-10-CM

## 2024-09-18 DIAGNOSIS — Z8669 Personal history of other diseases of the nervous system and sense organs: Secondary | ICD-10-CM | POA: Diagnosis not present

## 2024-09-18 DIAGNOSIS — M47816 Spondylosis without myelopathy or radiculopathy, lumbar region: Secondary | ICD-10-CM

## 2024-09-18 DIAGNOSIS — G514 Facial myokymia: Secondary | ICD-10-CM

## 2024-09-18 DIAGNOSIS — Z8679 Personal history of other diseases of the circulatory system: Secondary | ICD-10-CM

## 2024-09-18 DIAGNOSIS — G8929 Other chronic pain: Secondary | ICD-10-CM

## 2024-09-18 DIAGNOSIS — M797 Fibromyalgia: Secondary | ICD-10-CM

## 2024-09-18 DIAGNOSIS — E785 Hyperlipidemia, unspecified: Secondary | ICD-10-CM

## 2024-09-18 DIAGNOSIS — Z9225 Personal history of immunosupression therapy: Secondary | ICD-10-CM | POA: Diagnosis not present

## 2024-09-18 DIAGNOSIS — M533 Sacrococcygeal disorders, not elsewhere classified: Secondary | ICD-10-CM | POA: Diagnosis not present

## 2024-09-19 LAB — CBC WITH DIFFERENTIAL/PLATELET
Absolute Lymphocytes: 1841 {cells}/uL (ref 850–3900)
Absolute Monocytes: 536 {cells}/uL (ref 200–950)
Basophils Absolute: 31 {cells}/uL (ref 0–200)
Basophils Relative: 0.6 %
Eosinophils Absolute: 107 {cells}/uL (ref 15–500)
Eosinophils Relative: 2.1 %
HCT: 42.6 % (ref 35.9–46.0)
Hemoglobin: 14 g/dL (ref 11.7–15.5)
MCH: 29.7 pg (ref 27.0–33.0)
MCHC: 32.9 g/dL (ref 31.6–35.4)
MCV: 90.3 fL (ref 81.4–101.7)
MPV: 9.3 fL (ref 7.5–12.5)
Monocytes Relative: 10.5 %
Neutro Abs: 2586 {cells}/uL (ref 1500–7800)
Neutrophils Relative %: 50.7 %
Platelets: 317 Thousand/uL (ref 140–400)
RBC: 4.72 Million/uL (ref 3.80–5.10)
RDW: 12.6 % (ref 11.0–15.0)
Total Lymphocyte: 36.1 %
WBC: 5.1 Thousand/uL (ref 3.8–10.8)

## 2024-09-19 LAB — COMPREHENSIVE METABOLIC PANEL WITH GFR
AG Ratio: 1.6 (calc) (ref 1.0–2.5)
ALT: 23 U/L (ref 6–29)
AST: 27 U/L (ref 10–35)
Albumin: 4.4 g/dL (ref 3.6–5.1)
Alkaline phosphatase (APISO): 90 U/L (ref 37–153)
BUN: 11 mg/dL (ref 7–25)
CO2: 26 mmol/L (ref 20–32)
Calcium: 9.5 mg/dL (ref 8.6–10.4)
Chloride: 99 mmol/L (ref 98–110)
Creat: 0.82 mg/dL (ref 0.50–1.05)
Globulin: 2.8 g/dL (ref 1.9–3.7)
Glucose, Bld: 93 mg/dL (ref 65–99)
Potassium: 4.2 mmol/L (ref 3.5–5.3)
Sodium: 136 mmol/L (ref 135–146)
Total Bilirubin: 0.5 mg/dL (ref 0.2–1.2)
Total Protein: 7.2 g/dL (ref 6.1–8.1)
eGFR: 78 mL/min/1.73m2 (ref >=60)

## 2024-10-15 ENCOUNTER — Other Ambulatory Visit: Payer: Self-pay | Admitting: Rheumatology

## 2024-10-15 DIAGNOSIS — M1380 Other specified arthritis, unspecified site: Secondary | ICD-10-CM

## 2024-10-15 DIAGNOSIS — I7300 Raynaud's syndrome without gangrene: Secondary | ICD-10-CM

## 2024-10-15 DIAGNOSIS — Z79899 Other long term (current) drug therapy: Secondary | ICD-10-CM

## 2024-10-16 NOTE — Telephone Encounter (Signed)
 Last Fill: 08/07/2024  Labs: 09/18/2024 CBC and CMP WNL   TB Gold: 05/08/2024 negative    Next Visit: 03/19/2025  Last Visit: 09/18/2024  IK:Pwqojffjunmb arthritis   Current Dose per office note on 09/18/2024: Humira  40 mg sq injections every 14 days   Okay to refill Humira ?

## 2024-10-17 ENCOUNTER — Other Ambulatory Visit: Payer: Self-pay | Admitting: Rheumatology

## 2024-10-17 DIAGNOSIS — M1380 Other specified arthritis, unspecified site: Secondary | ICD-10-CM

## 2024-10-17 NOTE — Telephone Encounter (Signed)
 Last Fill: 03/13/2024  Labs: 09/18/2024 CBC and CMP WNL   Next Visit: 03/19/2025  Last Visit: 09/18/2024  DX: Inflammatory arthritis   Current Dose per office note on 09/18/2024: sulfasalazine  500 mg 2 tablets by mouth twice daily.   Okay to refill Sulfasalazine ?

## 2024-10-19 ENCOUNTER — Other Ambulatory Visit: Payer: Self-pay

## 2024-10-22 ENCOUNTER — Other Ambulatory Visit (HOSPITAL_COMMUNITY): Payer: Self-pay

## 2024-10-22 ENCOUNTER — Other Ambulatory Visit: Payer: Self-pay

## 2024-10-22 ENCOUNTER — Telehealth: Payer: Self-pay | Admitting: Pharmacy Technician

## 2024-10-22 NOTE — Progress Notes (Signed)
 Specialty Pharmacy Refill Coordination Note  Monica [name] is a 69 y.o. female assessed today regarding refills of clinic administered specialty medication(s) IncobotulinumtoxinA  (XEOMIN )   Clinic requested Courier to Provider Office   Delivery date: 11/08/24   Verified address: [facility]  [address] [place] [address] [place], [place], [address]   Medication will be filled on: 11/07/24   Patient is aware of the $128.00 copay.

## 2024-10-22 NOTE — Progress Notes (Signed)
 PA is approved and can now be processed at pharmacy level

## 2024-10-22 NOTE — Telephone Encounter (Signed)
 Pharmacy Patient Advocate Encounter   Received notification from Pt Calls Messages that prior authorization for XEOMIN   is required/requested.   Insurance verification completed.   The patient is insured through [employer].   Per test claim: PA required; PA submitted to above mentioned insurance via Latent Key/confirmation #/EOC [id number] Status is pending

## 2024-10-22 NOTE — Telephone Encounter (Signed)
 Pharmacy Patient Advocate Encounter  Received notification from [employer] that Prior Authorization for XEOMIN  has been APPROVED from 1.1.26 to 12.31.26. Ran test claim, Copay is $128. This test claim was processed through [facility]- copay amounts may vary at other pharmacies due to pharmacy/plan contracts, or as the patient moves through the different stages of their insurance plan.   PA #/Case ID/Reference #: [id number]

## 2024-11-09 ENCOUNTER — Ambulatory Visit: Admitting: Neurology

## 2024-11-16 ENCOUNTER — Ambulatory Visit: Admitting: Neurology

## 2024-11-16 DIAGNOSIS — G5131 Clonic hemifacial spasm, right: Secondary | ICD-10-CM

## 2024-11-16 MED ORDER — INCOBOTULINUMTOXINA 50 UNITS IM SOLR
50.0000 [IU] | INTRAMUSCULAR | Status: AC
  Administered 2024-11-16: 35 [IU] via INTRAMUSCULAR

## 2024-11-16 NOTE — Procedures (Signed)
 [facility]   Procedure Note Botox  Attending: Dr. Asberry Terrie Haring  Preoperative Diagnosis(es): hemifacial spasm/Blepharospasm  Toxin type:  xeomin  Results:  felt like last injections didn't work nearly as well and wanted to go back to injection pattern similar to previous  Consent obtained from: The patient Benefits discussed included, but were not limited to ability to open eyes and therefore see better.  Risk discussed included, but were not limited pain and discomfort, bleeding, bruising, excessive weakness, venous thrombosis, muscle atrophy and drooping of eyelids.  A copy of the patient medication guide was given to the patient which explains the blackbox warning.  Patients identity and treatment sites confirmed Yes.  .  Details of Procedure: Skin was cleaned with alcohol.  A 30 gauge, 1/2 inch needle was introduced to the target muscle.  Prior to injection, the needle plunger was aspirated to make sure the needle was not within a blood vessel.  There was no blood retrieved on aspiration.    Following is a summary of the muscles injected  And the amount of incobotulinumtoxin A used:   Dilution 0.9% preservative free saline mixed with 50 units incobotulinumtoxin A to make 5 U per 0.1cc (1 cc of saline used per 50 U incobotulinumtoxin A)  Injections  Location Left  Right Units  Upper lateral orbicularis oculi - pretarsal  2.5 2.5  Upper medial orbicularis oculi -pretarsal  2.5 2.5  Lateral orbicularis oculi: upper/middle/lower 2.5/5.0/2.5/2.5 5.0 (middle only) 17.5  Lower lateral orbicularis oculi   2.5 2.5  Lower medial orbicularis oculi  2.5 2.5  nasalis     zygomaticus  2.5x3 7.5  TOTAL UNITS:   35   Agent: Incobotulinumtoxin A 1 vials of Botox were used, 50 units, diluted as above   Total injected (Units): 35  Total wasted (Units): 15   Pt tolerated procedure well without complications.   Reinjection is anticipated in 3 months.

## 2024-11-16 NOTE — Addendum Note (Signed)
 Addended by: MERCER CREE C on: 11/16/2024 01:44 PM   Modules accepted: Orders

## 2025-02-15 ENCOUNTER — Ambulatory Visit: Payer: Self-pay | Admitting: Neurology

## 2025-03-08 ENCOUNTER — Ambulatory Visit: Admitting: Neurology

## 2025-03-19 ENCOUNTER — Ambulatory Visit: Admitting: Rheumatology

## 2025-05-28 ENCOUNTER — Ambulatory Visit: Admitting: Neurosurgery

## 2025-08-05 ENCOUNTER — Ambulatory Visit: Admitting: Nurse Practitioner
